# Patient Record
Sex: Male | Born: 1960 | Race: White | Hispanic: No | Marital: Married | Smoking: Never smoker
Health system: Southern US, Community
[De-identification: ages and names within clinical notes are randomized; demographics above are authoritative.]

## PROBLEM LIST (undated history)

## (undated) DIAGNOSIS — Z86010 Personal history of colonic polyps: Secondary | ICD-10-CM

## (undated) DIAGNOSIS — Z87898 Personal history of other specified conditions: Secondary | ICD-10-CM

## (undated) DIAGNOSIS — I10 Essential (primary) hypertension: Secondary | ICD-10-CM

## (undated) DIAGNOSIS — M1812 Unilateral primary osteoarthritis of first carpometacarpal joint, left hand: Secondary | ICD-10-CM

## (undated) DIAGNOSIS — C801 Malignant (primary) neoplasm, unspecified: Secondary | ICD-10-CM

## (undated) DIAGNOSIS — M1990 Unspecified osteoarthritis, unspecified site: Secondary | ICD-10-CM

## (undated) DIAGNOSIS — F419 Anxiety disorder, unspecified: Secondary | ICD-10-CM

## (undated) DIAGNOSIS — F411 Generalized anxiety disorder: Secondary | ICD-10-CM

## (undated) DIAGNOSIS — Z973 Presence of spectacles and contact lenses: Secondary | ICD-10-CM

## (undated) DIAGNOSIS — L729 Follicular cyst of the skin and subcutaneous tissue, unspecified: Secondary | ICD-10-CM

## (undated) DIAGNOSIS — T7840XA Allergy, unspecified, initial encounter: Secondary | ICD-10-CM

## (undated) DIAGNOSIS — Z9889 Other specified postprocedural states: Secondary | ICD-10-CM

## (undated) DIAGNOSIS — D72829 Elevated white blood cell count, unspecified: Secondary | ICD-10-CM

## (undated) DIAGNOSIS — E785 Hyperlipidemia, unspecified: Secondary | ICD-10-CM

## (undated) DIAGNOSIS — Q280 Arteriovenous malformation of precerebral vessels: Secondary | ICD-10-CM

## (undated) DIAGNOSIS — K5730 Diverticulosis of large intestine without perforation or abscess without bleeding: Secondary | ICD-10-CM

## (undated) DIAGNOSIS — D1802 Hemangioma of intracranial structures: Secondary | ICD-10-CM

## (undated) DIAGNOSIS — Z85828 Personal history of other malignant neoplasm of skin: Secondary | ICD-10-CM

## (undated) DIAGNOSIS — F329 Major depressive disorder, single episode, unspecified: Secondary | ICD-10-CM

## (undated) HISTORY — DX: Generalized anxiety disorder: F41.1

## (undated) HISTORY — PX: COLONOSCOPY: SHX174

## (undated) HISTORY — DX: Malignant (primary) neoplasm, unspecified: C80.1

## (undated) HISTORY — DX: Unilateral primary osteoarthritis of first carpometacarpal joint, left hand: M18.12

## (undated) HISTORY — PX: ANTERIOR CRUCIATE LIGAMENT REPAIR: SHX115

## (undated) HISTORY — DX: Elevated white blood cell count, unspecified: D72.829

## (undated) HISTORY — DX: Essential (primary) hypertension: I10

## (undated) HISTORY — DX: Allergy, unspecified, initial encounter: T78.40XA

## (undated) HISTORY — DX: Arteriovenous malformation of precerebral vessels: Q28.0

## (undated) HISTORY — DX: Anxiety disorder, unspecified: F41.9

## (undated) HISTORY — PX: POLYPECTOMY: SHX149

## (undated) HISTORY — DX: Hyperlipidemia, unspecified: E78.5

---

## 1991-11-11 HISTORY — PX: OTHER SURGICAL HISTORY: SHX169

## 1996-11-10 HISTORY — PX: WRIST GANGLION EXCISION: SUR520

## 1997-11-10 HISTORY — PX: SHOULDER SURGERY: SHX246

## 2002-07-13 ENCOUNTER — Inpatient Hospital Stay (HOSPITAL_COMMUNITY): Admission: EM | Admit: 2002-07-13 | Discharge: 2002-07-17 | Payer: Self-pay | Admitting: Psychiatry

## 2002-07-18 ENCOUNTER — Other Ambulatory Visit (HOSPITAL_COMMUNITY): Admission: RE | Admit: 2002-07-18 | Discharge: 2002-08-12 | Payer: Self-pay | Admitting: *Deleted

## 2002-08-24 ENCOUNTER — Encounter: Payer: Self-pay | Admitting: Family Medicine

## 2002-08-24 ENCOUNTER — Encounter: Admission: RE | Admit: 2002-08-24 | Discharge: 2002-08-24 | Payer: Self-pay | Admitting: Family Medicine

## 2007-11-06 ENCOUNTER — Inpatient Hospital Stay (HOSPITAL_COMMUNITY): Admission: EM | Admit: 2007-11-06 | Discharge: 2007-11-08 | Payer: Self-pay | Admitting: Family Medicine

## 2008-11-09 ENCOUNTER — Ambulatory Visit (HOSPITAL_COMMUNITY): Admission: RE | Admit: 2008-11-09 | Discharge: 2008-11-09 | Payer: Self-pay | Admitting: Internal Medicine

## 2011-03-25 NOTE — Discharge Summary (Signed)
NAME:  Nathan Ballard, Nathan Ballard NO.:  [id number]   MEDICAL RECORD NO.:  [id number]          PATIENT TYPE:  INP   LOCATION:  4735                         FACILITY:  [facility]   PHYSICIAN:  Herbie Saxon, MDDATE OF BIRTH:  [birth year]   DATE OF ADMISSION:  11/06/2007  DATE OF DISCHARGE:  11/08/2007                               DISCHARGE SUMMARY   DISCHARGE DIAGNOSES:  1. Altered mental status, resolved.  Possibly related to medication      interaction, question alcohol intoxication.  2. Hypertension.  3. Hyperlipidemia.  4. Anxiety.  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  He had been complaining of  headache for the preceding 3 days and he took a mixture of medications,  but he does not know the exact dose; just tried to alleviate the  headache.  The patient's confusion has been improved since admission.  MRI brain was performed and the report is pending.  Also interestingly,  his alcohol level was noted to be elevated at 105 at presentation.  However, to the patient vehemently denies any history of alcohol abuse  and his GGCP checked was also 96.  There is no seizure activity, no limb  weakness, no slurring of speech.  Memory is intact.  Lab work has mostly  been negative.   DISCHARGE CONDITION:  Stable.   DIET:  Heart-healthy, low-cholesterol.   ACTIVITY:  No restrictions.   FOLLOWUP:  Follow up with his primary care physician, Dr. Ricki Miller, in 3  days.  __________  is to consider possible neurology evaluation and  possible EEG to rule out transient seizure activity.   PHYSICAL EXAMINATION:  GENERAL:  Today, he is a middle-aged man, alert,  in no acute distress.  VITAL SIGNS:  Temperature 98, pulse is 80, respiratory rate is 20, blood  pressure 112/76.  HEENT:  Pupils  are equal and reactive to light and accommodation.  Head  is atraumatic, normocephalic.  Mucus membranes are moist.  NECK:  Supple.  Oropharynx and nasopharynx are clear.  No elevated JVD  or carotid bruits, no thyromegaly.  CHEST:  Clinically clear.  CARDIAC:  Heart sounds 1 and 2.  Regular rate and rhythm.  ABDOMEN:  Soft, nontender, no organomegaly.  Bowel sounds are  normoactive.  NEUROLOGIC:  He is alert, oriented x3.  Cranial nerves II-XII are  intact.  EXTREMITIES:  Peripheral pulses present.  No pedal edema.  Power is 5 in  all limbs and deep tendon reflexes are 2+ in all limbs.   Available lab tests show the lipid profile, HDL level of 33, troponin is  0.01, CK-MB 1.1.  Urine drug screen negative.  Urinalysis also negative.  The patient was sent for an MRI, which is pending.   PLAN:  The patient is to be discharged home if MRI does not show any  acute abnormality.  This has been explained to him.  His Relafen has  been discontinued.  The patient has been advised not to take to any  concoction of medications without conferring with his primary care  physician.   MEDICATIONS ON DISCHARGE:  Caduet 5/10 mg 1 daily.      Herbie Saxon, MD  Electronically Signed     MIO/MEDQ  D:  11/08/2007  T:  11/08/2007  Job:  [id number]

## 2011-03-25 NOTE — Procedures (Signed)
EEG NUMBER:  [id number].   CLINICAL HISTORY:  The patient is a 50 year old with an episode of  syncope on Christmas Eve.  The patient was slow to answer questions and  staggering.  The study is being done to look for the presence of  seizures (780.2).   PROCEDURE:  The tracing is carried out on a 32-channel, digital Cadwell  recorder reformatted into 16-channel montages with one devoted to EKG.  The patient was awake and drowsy during the recording.  The  international 10/20 system of lead placement was used.   MEDICATIONS:  Caduet.   DESCRIPTION OF FINDINGS:  Dominant frequency is a 10 Hz alpha range  activity admixed with beta range components throughout the entire  record.  The patient becomes drowsy at the end with mixed frequency, 10-  20 microvolt theta range components superimposed upon the beta range  background.  There was no focal slowing.  There was no interictal or  epileptiform activity in form of spikes or sharp waves.  Photic  stimulation failed to induce a driving response.  Hyperventilation  caused arousal, but no other change in the background.   EKG showed regular sinus rhythm with ventricular response of 72 beats  per minute.   IMPRESSION:  Normal record with the patient awake and drowsy.      Nathan Ballard. Sharene Skeans, M.D.  Electronically Signed     UEA:VWUJ  D:  11/09/2008 11:57:01  T:  11/10/2008 01:25:48  Job #:  [id number]   cc:   Juline Patch, M.D.  Fax: [phone]

## 2011-03-25 NOTE — Discharge Summary (Signed)
NAME:  Nathan Ballard, Nathan Ballard NO.:  [id number]   MEDICAL RECORD NO.:  [id number]          PATIENT TYPE:  INP   LOCATION:  4735                         FACILITY:  [facility]   PHYSICIAN:  Herbie Saxon, MDDATE OF BIRTH:  May 26, 1961   DATE OF ADMISSION:  11/06/2007  DATE OF DISCHARGE:                               DISCHARGE SUMMARY   ADDENDUM:  The MRI report shows no acute process, evidence of left  frontal lobe angioma seen.  This finding has been discussed with the  patient and he will need to have neurosurgical evaluation as outpatient.  This is to be arranged by Dr. Ricki Miller, his primary care physician.  The  patient has also been educated on the need to report to the emergency  room in case there is a recurrence of headache, nausea, vomiting,  blurring of vision, any other symptoms suggestive of intracranial  pressure.  He is also to inform his family of the finding in case he has  any confusion.  The patient has also been instructed on the need to have  an EEG possibly outpatient, and to have fall and seizure precautions as  well.  This patient is still going to be discharged today, and as  earlier planned neurology and neurosurgery evaluation in the next 1-2  weeks as per primary care physician.      Herbie Saxon, MD  Electronically Signed     MIO/MEDQ  D:  11/08/2007  T:  11/08/2007  Job:  [id number]

## 2011-03-25 NOTE — H&P (Signed)
NAME:  Nathan Ballard, Nathan Ballard NO.:  [id number]   MEDICAL RECORD NO.:  [id number]          PATIENT TYPE:  INP   LOCATION:  4735                         FACILITY:  [facility]   PHYSICIAN:  Della Goo, M.D. DATE OF BIRTH:  May 23, 1961   DATE OF ADMISSION:  11/06/2007  DATE OF DISCHARGE:                              HISTORY & PHYSICAL   PRIMARY CARE PHYSICIAN:  Unassigned.   CHIEF COMPLAINT:  Confusion.   HISTORY OF PRESENT ILLNESS:  This is a 50 year old male presenting to  the Emergency Department secondary to complaints of confusion that  lasted approximately three hours.  The patient's wife gives the report  and the patient is at bedside.  She reports that he was visiting his  father and she was called from the father's house secondary to Nathan Ballard acting out of character.  She states that she asked him over the  phone what he was doing, and he stated at that time that he was playing  video games and basically there are no video games at his father's  house.  When asked what else he had been doing, he further reported that  he was celebrating his children's birthday party and his wife reports  that that also was not true.  The patient in the Emergency Department  was beginning to be more lucid.  He reports having a constant headache  for the past three days, and he does remember the afternoon and the  events that his wife describes.  He denies having any fevers, chills,  nausea, vomiting.  The patient also denies any alcohol history.  The  patient was also found to have two unidentified pills in his pocket  which were reported as being from a [place] company.  These pills were  attempted to be identified, but Pharmacy was unsuccessful.   PAST MEDICAL HISTORY:  Significant for hypertension and hyperlipidemia.   PAST SURGICAL HISTORY:  History of three left knee surgeries, a right  shoulder surgery, and two right wrist surgeries.  The patient also has  had two  right great toe surgeries.   MEDICATIONS:  Medications at this time include Caduet, dosage unknown at  this time.  The patient also takes Relafen twice a day and ibuprofen as  needed.   ALLERGIES:  NO KNOWN DRUG ALLERGIES.   SOCIAL HISTORY:  The patient is a nonsmoker and he denies any alcohol  usage.   FAMILY HISTORY:  Positive for coronary artery disease in his father and  his paternal grandmother.  Also positive for diabetes mellitus in his  father and paternal grandmother, and hypertension as well in both of  them.  The patient also has a history of cancer in his mother, who had a  brain tumor.   REVIEW OF SYSTEMS:  Pertinent for mentioned above.   PHYSICAL EXAMINATION:  This is a 50 year old well-nourished, well-  developed male in no acute distress or discomfort at this time.  Vital  Signs: Temperature 98.1, blood pressure 127/89, heart rate 134,  respirations 18, O2 saturations 96-97%.  HEENT examination :  Normocephalic, atraumatic.  Pupils equally round  and reactive to light.  Extraocular muscles are intact funduscopic benign.  Oropharynx clear.  Neck is supple.  Full range of motion.  No thyromegaly, adenopathy,  jugular venous distention.  CARDIOVASCULAR:  Regular rate and rhythm.  No murmurs, gallops or rubs.  LUNGS: Clear to auscultation bilaterally.  ABDOMEN: Positive bowel sounds.  Soft, nontender, nondistended.  EXTREMITIES:  Without cyanosis, clubbing, or edema.  NEUROLOGIC  examination:  The patient is alert and oriented times three.  His speech  is clear.  There are no focal deficits.   LABORATORY STUDIES:  White blood cell count 6.3, hemoglobin 16.2,  hematocrit 46.9, platelets 291, neutrophils 61%, lymphocytes 30%.  Urinalysis negative.  Sodium 139, potassium 3.6, chloride 108, carbon  dioxide 23, BUN 7, creatinine 0.8, glucose 91.  Urine drug screen  negative.  Pro time 13.1, INR 1.0, PTT 25.  Alcohol level 105.   CT scan of the head actually negative for  any acute findings.   ASSESSMENT:  A 50 year old male being admitted with:  1. Altered mental status.  2. Cephalgia.  3. Hypertension.  4. Hyperlipidemia.   PLAN:  The patient will be admitted to a telemetry area for cardiac  monitoring.  Neurologic checks have been ordered.  An MRI study will be  performed of the brain in the a.m.  DVT and GI prophylaxes have been  ordered.  The patient's medications will be further verified.  The  patient will also be monitored for further changes in his condition.  His symptoms may have been secondary to alcohol and the ingestion of two  unknown medications.  This effect may have been transient.      Della Goo, M.D.  Electronically Signed     HJ/MEDQ  D:  11/07/2007  T:  11/08/2007  Job:  [id number]

## 2011-03-28 NOTE — H&P (Signed)
NAME:  Nathan Ballard, Nathan Ballard NO.:  [id number]   MEDICAL RECORD NO.:  [id number]                   PATIENT TYPE:  IPS   LOCATION:  0507                                 [facility]   PHYSICIAN:  Jeanice Lim, M.D.              DATE OF BIRTH:  13-Dec-1960   DATE OF ADMISSION:  07/13/2002  DATE OF DISCHARGE:  07/17/2002                         PSYCHIATRIC ADMISSION ASSESSMENT   IDENTIFYING INFORMATION:  A 50 year old married white male who was  voluntarily admitted on July 14, 2002.   HISTORY OF PRESENT ILLNESS:  The patient presents with a history of  polysubstance abuse, increasing use of his own medications, over-medicating,  taking up to 7 Valium per day, 4 Flexeril tablets, 3 Fioricets, using a  month's supply over a 3 day period.  The patient told his primary care  Nathan Ballard he needed to get a refill while he was on vacation and was  confronted by his wife of his excess use of his medications.  He states he  takes his medications to numb himself from responsibility.  He feels very  inadequate, having financial difficulties, feels depressed although he  states he has nothing to be depressed about.  His sleep and appetite has  been satisfactory.  He denies any psychotic symptoms and promises safety on  the unit.   PAST PSYCHIATRIC HISTORY:  First admission to [facility], no  other inpatient admissions, no outpatient treatment, no prior suicide  attempt, no history of detox.   SOCIAL HISTORY:  He is a 50 year old married white male, married for 13  years, has twins age 56, lives with his wife and children.  He works at  [employer], no legal problems, completed 2 years of college.   FAMILY HISTORY:  None.   ALCOHOL DRUG HISTORY:  The patient is a nonsmoker, nondrinker, denies any  substance abuse.   PAST MEDICAL HISTORY:  Primary care Nathan Ballard is Dr. Lupe Ballard at [facility].  Medical problems are  migraines.   MEDICATIONS:  Has been taking Valium 5 mg t.i.d. for anxiety, Fioricet for  headaches, Flexeril 1-2 q.4 hours, Effexor XR 150 mg every day, has been on  that for the past 10 months.  The patient has been on Wellbutrin in the  past.   DRUG ALLERGIES:  No known allergies.   PHYSICAL EXAMINATION:   REVIEW OF SYSTEMS:  The patient has borderline hypertension, history of  migraines for the past 2-3 months, has had a vasectomy, reports recent eye  infection of conjunctivitis and has occasional musculoskeletal problems.  Remainder of review of systems is noncontributory.  VITAL SIGNS:  the patient is 6 feet 1 inch tall.  Temperature is 97.7, heart  rate 95, 24 respirations, blood pressure is 158/96.  GENERAL:  The patient appears as a well-nourished, well-developed middle-age  male.  Does not appear in any acute distress, although he does complain of a  headache at this time.  HEAD:  Normocephalic, atraumatic.  SKIN:  Color is good.  CHEST:  His respirations are easy and regular.  NEURO:  Nonfocal exam, no tics or tremors.   LABORATORY DATA:  His CBC is within normal limits.  His CMET is within  normal limits.   MENTAL STATUS EXAM:  He is an alert, middle-aged Caucasian male, he is well-  developed, cooperative, with fair eye contact.  Speech is clear, mood is  depressed, affect is flat.  Thought processes are coherent, no psychosis, no  auditory or visual hallucinations, no suicidal or homicidal ideation.  Cognitive function intact.  Memory is fair, judgment is poor, insight is  poor.   ADMISSION DIAGNOSES:   AXIS I:  1. Major depression, severe.  2. Polysubstance abuse.   AXIS II:  Deferred.   AXIS III:  Conjunctivitis and migraines.   AXIS IV:  Problems with occupation and other psychosocial problems.   AXIS V:  Current is 45, this past year 23.   PLAN:  Voluntary admission for depression and polysubstance abuse.  Contract  for safety, check every 15  minutes.  Will put the patient on low-dose  Librium to detox safely.  Will encourage fluids.  Will increase his  antidepressant to decrease depressive symptoms, add trazodone for sleep,  Vistaril for anxiety.  Our plan is to stabilize his mood and thinking so the  patient can be safe, to increase his coping skills by attending groups.  Have a family session prior to discharge.  To follow up with private  psychiatrist, remain medication compliant, to attend IOP after discharge.   TENTATIVE LENGTH OF CARE:  3-5 days.       Landry Corporal, N.P.                       Jeanice Lim, M.D.    JO/MEDQ  D:  09/30/2002  T:  09/30/2002  Job:  [id number]

## 2011-03-28 NOTE — Discharge Summary (Signed)
NAME:  Nathan Ballard, Nathan Ballard NO.:  [id number]   MEDICAL RECORD NO.:  [id number]                   PATIENT TYPE:  IPS   LOCATION:  0507                                 [facility]   PHYSICIAN:  Jeanice Lim, M.D.              DATE OF BIRTH:  04-Oct-1961   DATE OF ADMISSION:  07/13/2002  DATE OF DISCHARGE:  07/17/2002                                 DISCHARGE SUMMARY   IDENTIFYING DATA:  This is a 50 year old Caucasian male voluntarily  admitted, presenting with a history of polysubstance abuse, escalating  Valium use, feeling inadequate, overwhelmed, depressed, with fleeting  suicidal ideation.   MEDICATIONS:  1. Valium.  2. Fioricet.  3. Flexeril.  4. Effexor.   DRUG ALLERGIES:  No known drug allergies.   PHYSICAL EXAMINATION:  GENERAL: Essentially within normal limits.  NEUROLOGIC: Nonfocal.   LABORATORY DATA:  Routine admission labs were essentially within normal  limits.   MENTAL STATUS EXAM:  Alert, middle-aged Caucasian male, cooperative with  fair eye contact.  Speech was clear.  Mood: Depressed.  Affect: Flat.  Thought process: Goal directed.  Thought content: Negative for dangerous  ideation or psychotic symptoms.  Cognitive: Intact.  Judgment and insight:  Poor.   ADMISSION DIAGNOSES:   AXIS I:  1. Major depression, severe.  2. Polysubstance abuse.   AXIS II:  None.   AXIS III:  1. Conjunctivitis.  2. History of migraines.   AXIS IV:  Moderate problems with primary support group and occupational  problems.   AXIS V:  [id number]   HOSPITAL COURSE:  The patient was admitted, ordered routine p.r.n.  medications, underwent further monitoring, and was encouraged to participate  in individual, group, and milieu therapy including substance abuse  treatment.  The patient was detoxified with Fioricet, Valium, and Flexeril,  with which he had been medicating himself.  He was upset about financial  situations.  He reported  motivation to see a psychologist in [place]  and to follow up with the [facility].  The patient was appropriate on the unit,  reporting no dangerous ideation, tolerated detoxification with no  significant withdrawal symptoms, and reported motivation to remain  abstinent.   CONDITION ON DISCHARGE:  Condition on discharge was improved.  Mood was more  euthymic.  Affect: Brighter.  Thought processes: Goal directed.  Thought  content: Negative for dangerous ideation or psychotic symptoms.  The patient  reported high motivation to remain sober and follow up with [facility].   DISCHARGE MEDICATIONS:  1. Effexor XR 75 mg two q.a.m. and one at 1 p.m.  2. Trazodone 100 mg one q.h.s.  3. Motrin 600 mg q.6.h. p.r.n. pain.   FOLLOW UP:  The patient was to follow up with Consuella Lose C. Valentina Lucks, M.D. at  [facility] and [facility] on Monday, September 8, at 4:30 p.m.   DISCHARGE DIAGNOSES:   AXIS I:  1. Major  depression, severe.  2. Polysubstance abuse.   AXIS II:  None.   AXIS III:  1. Conjunctivitis.  2. History of migraines.   AXIS IV:  Moderate problems with primary support group and occupational  problems.   AXIS V:  Global assessment of functioning on discharge was 55.                                                Jeanice Lim, M.D.    JEM/MEDQ  D:  09/01/2002  T:  09/01/2002  Job:  [id number]

## 2011-08-15 LAB — RAPID URINE DRUG SCREEN, HOSP PERFORMED
Amphetamines: NOT DETECTED
Barbiturates: NOT DETECTED
Benzodiazepines: NOT DETECTED
Cocaine: NOT DETECTED
Opiates: NOT DETECTED
Tetrahydrocannabinol: NOT DETECTED

## 2011-08-15 LAB — CBC
HCT: 46.9
Hemoglobin: 16.2
MCHC: 34.5
MCV: 93.6
Platelets: 291
RBC: 5.01
RDW: 12.6
WBC: 6.3

## 2011-08-15 LAB — URINALYSIS, ROUTINE W REFLEX MICROSCOPIC
Glucose, UA: NEGATIVE
Hgb urine dipstick: NEGATIVE
Ketones, ur: NEGATIVE
pH: 6.5

## 2011-08-15 LAB — COMPREHENSIVE METABOLIC PANEL
ALT: 27
AST: 24
Albumin: 4.5
Alkaline Phosphatase: 57
BUN: 7
CO2: 23
Calcium: 9
Chloride: 108
Creatinine, Ser: 0.8
GFR calc Af Amer: >60
GFR calc non Af Amer: >60
Glucose, Bld: 91
Potassium: 3.6
Sodium: 139
Total Bilirubin: 0.5
Total Protein: 6.9

## 2011-08-15 LAB — DIFFERENTIAL
Basophils Absolute: 0
Basophils Relative: 0
Eosinophils Absolute: 0.1
Eosinophils Relative: 1
Lymphocytes Relative: 30
Lymphs Abs: 1.9
Monocytes Absolute: 0.5
Monocytes Relative: 8
Neutro Abs: 3.8
Neutrophils Relative %: 61

## 2011-08-15 LAB — TROPONIN I
Troponin I: 0.01
Troponin I: 0.02
Troponin I: 0.03

## 2011-08-15 LAB — CK TOTAL AND CKMB (NOT AT ARMC)
CK, MB: 1.2
CK, MB: 1.5
Relative Index: 0.8
Relative Index: 0.8
Relative Index: 0.9
Total CK: 143
Total CK: 167

## 2011-08-15 LAB — LIPID PANEL
Cholesterol: 140
HDL: 33 — ABNORMAL LOW
LDL Cholesterol: 93
Total CHOL/HDL Ratio: 4.2
Triglycerides: 69
VLDL: 14

## 2011-08-15 LAB — ETHANOL: Alcohol, Ethyl (B): 105 — ABNORMAL HIGH

## 2011-08-15 LAB — PROTIME-INR
INR: 1
Prothrombin Time: 13.1

## 2011-08-15 LAB — GAMMA GT: GGT: 18

## 2012-07-28 ENCOUNTER — Encounter: Payer: Self-pay | Admitting: Internal Medicine

## 2012-08-20 ENCOUNTER — Encounter: Payer: Self-pay | Admitting: Gastroenterology

## 2012-08-23 ENCOUNTER — Ambulatory Visit (AMBULATORY_SURGERY_CENTER): Payer: Managed Care, Other (non HMO) | Admitting: *Deleted

## 2012-08-23 ENCOUNTER — Encounter: Payer: Self-pay | Admitting: Internal Medicine

## 2012-08-23 VITALS — Ht 72.0 in | Wt 233.0 lb

## 2012-08-23 DIAGNOSIS — Z1211 Encounter for screening for malignant neoplasm of colon: Secondary | ICD-10-CM

## 2012-08-23 MED ORDER — NA SULFATE-K SULFATE-MG SULF 17.5-3.13-1.6 GM/177ML PO SOLN: ORAL | Status: DC

## 2012-09-03 ENCOUNTER — Encounter: Payer: Self-pay | Admitting: Internal Medicine

## 2012-09-03 ENCOUNTER — Ambulatory Visit (AMBULATORY_SURGERY_CENTER): Payer: Managed Care, Other (non HMO) | Admitting: Internal Medicine

## 2012-09-03 VITALS — BP 118/71 | HR 69 | Temp 97.8°F | Resp 37 | Ht 72.0 in | Wt 233.0 lb

## 2012-09-03 DIAGNOSIS — Z86010 Personal history of colonic polyps: Secondary | ICD-10-CM

## 2012-09-03 DIAGNOSIS — D126 Benign neoplasm of colon, unspecified: Secondary | ICD-10-CM

## 2012-09-03 DIAGNOSIS — Z1211 Encounter for screening for malignant neoplasm of colon: Secondary | ICD-10-CM

## 2012-09-03 DIAGNOSIS — Z860101 Personal history of adenomatous and serrated colon polyps: Secondary | ICD-10-CM

## 2012-09-03 DIAGNOSIS — K635 Polyp of colon: Secondary | ICD-10-CM

## 2012-09-03 HISTORY — DX: Personal history of adenomatous and serrated colon polyps: Z86.0101

## 2012-09-03 HISTORY — PX: COLONOSCOPY WITH PROPOFOL: SHX5780

## 2012-09-03 HISTORY — DX: Personal history of colonic polyps: Z86.010

## 2012-09-03 MED ORDER — SODIUM CHLORIDE 0.9 % IV SOLN: 500.0000 mL | INTRAVENOUS | Status: DC

## 2012-09-03 NOTE — Op Note (Signed)
[facility] [address] [place], [address]   COLONOSCOPY PROCEDURE REPORT  PATIENT: Nathan Ballard, Nathan Ballard  MR#: [id number] BIRTHDATE: 1961/04/28 , 50  yrs. old GENDER: Male ENDOSCOPIST: Beverley Fiedler, MD REFERRED XB:JYNW, Richard PROCEDURE DATE:  09/03/2012 PROCEDURE:   Colonoscopy with snare polypectomy and Colonoscopy with cold biopsy polypectomy ASA CLASS:   Class II INDICATIONS:average risk screening and first colonoscopy. MEDICATIONS: MAC sedation, administered by CRNA and propofol (Diprivan) 400mg  IV  DESCRIPTION OF PROCEDURE:   After the risks benefits and alternatives of the procedure were thoroughly explained, informed consent was obtained.  A digital rectal exam revealed no rectal mass.   The LB CF-H180AL [id number]  endoscope was introduced through the anus and advanced to the cecum, which was identified by both the appendix and ileocecal valve. No adverse events experienced. The quality of the prep was Suprep good  The instrument was then slowly withdrawn as the colon was fully examined.   COLON FINDINGS: Two sessile polyps ranging between 3-35mm in size were found in the ascending colon.  A polypectomy was performed with a cold snare.  The resection was complete and one polyp was retrieved.   A sessile polyp measuring 3 mm in size was found in the descending colon.  A polypectomy was performed with cold forceps.  The resection was complete and the polyp tissue was completely retrieved.   There was moderate diverticulosis noted in the ascending colon, descending colon, and sigmoid colon with associated muscular hypertrophy.  Retroflexed views revealed no abnormalities. The time to cecum=2 minutes 56 seconds.  Withdrawal time=14 minutes 44 seconds.  The scope was withdrawn and the procedure completed.  COMPLICATIONS: There were no complications.  ENDOSCOPIC IMPRESSION: 1.   Two sessile polyps ranging between 3-78mm in size were found in the  ascending colon; polypectomy was performed with a cold snare 2.   Sessile polyp measuring 3 mm in size was found in the descending colon; polypectomy was performed with cold forceps 3.   There was moderate diverticulosis noted in the ascending colon, descending colon, and sigmoid colon  RECOMMENDATIONS: 1.  Await pathology results 2.  High fiber diet 3.  Timing of repeat colonoscopy will be determined by pathology findings. 4.  You will receive a letter within 1-2 weeks with the results of your biopsy as well as final recommendations.  Please call my office if you have not received a letter after 3 weeks.  eSigned:  Beverley Fiedler, MD 09/03/2012 2:06 PM cc: Juline Patch, MD and The Patient

## 2012-09-03 NOTE — Patient Instructions (Addendum)
YOU HAD AN ENDOSCOPIC PROCEDURE TODAY AT THE [facility]: Refer to the procedure report that was given to you for any specific questions about what was found during the examination.  If the procedure report does not answer your questions, please call your gastroenterologist to clarify.  If you requested that your care partner not be given the details of your procedure findings, then the procedure report has been included in a sealed envelope for you to review at your convenience later.  YOU SHOULD EXPECT: Some feelings of bloating in the abdomen. Passage of more gas than usual.  Walking can help get rid of the air that was put into your GI tract during the procedure and reduce the bloating. If you had a lower endoscopy (such as a colonoscopy or flexible sigmoidoscopy) you may notice spotting of blood in your stool or on the toilet paper. If you underwent a bowel prep for your procedure, then you may not have a normal bowel movement for a few days.  DIET: Your first meal following the procedure should be a light meal and then it is ok to progress to your normal diet.  A half-sandwich or bowl of soup is an example of a good first meal.  Heavy or fried foods are harder to digest and may make you feel nauseous or bloated.  Likewise meals heavy in dairy and vegetables can cause extra gas to form and this can also increase the bloating.  Drink plenty of fluids but you should avoid alcoholic beverages for 24 hours.  ACTIVITY: Your care partner should take you home directly after the procedure.  You should plan to take it easy, moving slowly for the rest of the day.  You can resume normal activity the day after the procedure however you should NOT DRIVE or use heavy machinery for 24 hours (because of the sedation medicines used during the test).    SYMPTOMS TO REPORT IMMEDIATELY: A gastroenterologist can be reached at any hour.  During normal business hours, 8:30 AM to 5:00 PM Monday through Friday,  call [phone].  After hours and on weekends, please call the GI answering service at [phone] who will take a message and have the physician on call contact you.   Following lower endoscopy (colonoscopy or flexible sigmoidoscopy):  Excessive amounts of blood in the stool  Significant tenderness or worsening of abdominal pains  Swelling of the abdomen that is new, acute  Fever of 100F or higher    FOLLOW UP: If any biopsies were taken you will be contacted by phone or by letter within the next 1-3 weeks.  Call your gastroenterologist if you have not heard about the biopsies in 3 weeks.  Our staff will call the home number listed on your records the next business day following your procedure to check on you and address any questions or concerns that you may have at that time regarding the information given to you following your procedure. This is a courtesy call and so if there is no answer at the home number and we have not heard from you through the emergency physician on call, we will assume that you have returned to your regular daily activities without incident.  SIGNATURES/CONFIDENTIALITY: You and/or your care partner have signed paperwork which will be entered into your electronic medical record.  These signatures attest to the fact that that the information above on your After Visit Summary has been reviewed and is understood.  Full responsibility of the confidentiality   of this discharge information lies with you and/or your care-partner.   INFORMATION ON POLYPS GIVEN TO YOU TODAY  INFORMATION ON DIVERTICULOSIS & HIGH FIBER DIET GIVEN TO YOU TODAY

## 2012-09-03 NOTE — Progress Notes (Signed)
Patient did not experience any of the following events: a burn prior to discharge; a fall within the facility; wrong site/side/patient/procedure/implant event; or a hospital transfer or hospital admission upon discharge from the facility. (G8907) Patient did not have preoperative order for IV antibiotic SSI prophylaxis. (G8918)  

## 2012-09-06 ENCOUNTER — Telehealth: Payer: Self-pay

## 2012-09-06 NOTE — Telephone Encounter (Signed)
  Follow up Call-  Call back number 09/03/2012  Post procedure Call Back phone  # [phone]  Permission to leave phone message Yes     Patient questions:  Do you have a fever, pain , or abdominal swelling? no Pain Score  0 *  Have you tolerated food without any problems? yes  Have you been able to return to your normal activities? yes  Do you have any questions about your discharge instructions: Diet   no Medications  no Follow up visit  no  Do you have questions or concerns about your Care? no  Actions: * If pain score is 4 or above: No action needed, pain <4.

## 2012-09-09 ENCOUNTER — Encounter: Payer: Self-pay | Admitting: Internal Medicine

## 2014-08-21 ENCOUNTER — Other Ambulatory Visit: Payer: Self-pay | Admitting: Dermatology

## 2014-08-21 DIAGNOSIS — Z85828 Personal history of other malignant neoplasm of skin: Secondary | ICD-10-CM

## 2014-08-21 DIAGNOSIS — Z9889 Other specified postprocedural states: Secondary | ICD-10-CM

## 2014-08-21 HISTORY — DX: Personal history of other malignant neoplasm of skin: Z85.828

## 2014-08-21 HISTORY — DX: Other specified postprocedural states: Z98.890

## 2015-02-15 ENCOUNTER — Other Ambulatory Visit: Payer: Self-pay | Admitting: Dermatology

## 2015-08-13 ENCOUNTER — Other Ambulatory Visit: Payer: Self-pay | Admitting: Orthopaedic Surgery

## 2015-08-13 DIAGNOSIS — M25561 Pain in right knee: Secondary | ICD-10-CM

## 2015-08-22 ENCOUNTER — Ambulatory Visit
Admission: RE | Admit: 2015-08-22 | Discharge: 2015-08-22 | Disposition: A | Discharge status: Home / Self Care | Payer: Managed Care, Other (non HMO) | Source: Ambulatory Visit | Attending: Orthopaedic Surgery | Admitting: Orthopaedic Surgery

## 2015-08-22 DIAGNOSIS — M25561 Pain in right knee: Secondary | ICD-10-CM

## 2015-08-30 ENCOUNTER — Other Ambulatory Visit (HOSPITAL_BASED_OUTPATIENT_CLINIC_OR_DEPARTMENT_OTHER): Payer: Self-pay | Admitting: Orthopaedic Surgery

## 2015-09-12 ENCOUNTER — Other Ambulatory Visit: Payer: Self-pay | Admitting: Cardiology

## 2015-09-12 ENCOUNTER — Ambulatory Visit
Admission: RE | Admit: 2015-09-12 | Discharge: 2015-09-12 | Disposition: A | Discharge status: Home / Self Care | Payer: Managed Care, Other (non HMO) | Source: Ambulatory Visit | Attending: Cardiology | Admitting: Cardiology

## 2015-09-12 DIAGNOSIS — R0602 Shortness of breath: Secondary | ICD-10-CM

## 2015-09-13 ENCOUNTER — Encounter (HOSPITAL_BASED_OUTPATIENT_CLINIC_OR_DEPARTMENT_OTHER): Payer: Self-pay | Admitting: *Deleted

## 2015-09-19 ENCOUNTER — Ambulatory Visit (HOSPITAL_BASED_OUTPATIENT_CLINIC_OR_DEPARTMENT_OTHER)
Admission: RE | Admit: 2015-09-19 | Discharge: 2015-09-19 | Disposition: A | Discharge status: Home / Self Care | Payer: Managed Care, Other (non HMO) | Source: Ambulatory Visit | Attending: Orthopaedic Surgery | Admitting: Orthopaedic Surgery

## 2015-09-19 ENCOUNTER — Ambulatory Visit (HOSPITAL_BASED_OUTPATIENT_CLINIC_OR_DEPARTMENT_OTHER): Payer: Managed Care, Other (non HMO) | Admitting: Certified Registered"

## 2015-09-19 ENCOUNTER — Encounter (HOSPITAL_BASED_OUTPATIENT_CLINIC_OR_DEPARTMENT_OTHER)
Admission: RE | Disposition: A | Discharge status: Home / Self Care | Payer: Self-pay | Source: Ambulatory Visit | Attending: Orthopaedic Surgery

## 2015-09-19 ENCOUNTER — Encounter (HOSPITAL_BASED_OUTPATIENT_CLINIC_OR_DEPARTMENT_OTHER): Payer: Self-pay | Admitting: Anesthesiology

## 2015-09-19 DIAGNOSIS — Z79899 Other long term (current) drug therapy: Secondary | ICD-10-CM | POA: Diagnosis not present

## 2015-09-19 DIAGNOSIS — I10 Essential (primary) hypertension: Secondary | ICD-10-CM | POA: Diagnosis not present

## 2015-09-19 DIAGNOSIS — M1990 Unspecified osteoarthritis, unspecified site: Secondary | ICD-10-CM | POA: Insufficient documentation

## 2015-09-19 DIAGNOSIS — Z885 Allergy status to narcotic agent status: Secondary | ICD-10-CM | POA: Diagnosis not present

## 2015-09-19 DIAGNOSIS — M65861 Other synovitis and tenosynovitis, right lower leg: Secondary | ICD-10-CM | POA: Insufficient documentation

## 2015-09-19 DIAGNOSIS — M25861 Other specified joint disorders, right knee: Secondary | ICD-10-CM | POA: Insufficient documentation

## 2015-09-19 DIAGNOSIS — M23231 Derangement of other medial meniscus due to old tear or injury, right knee: Secondary | ICD-10-CM | POA: Diagnosis not present

## 2015-09-19 DIAGNOSIS — F419 Anxiety disorder, unspecified: Secondary | ICD-10-CM | POA: Insufficient documentation

## 2015-09-19 DIAGNOSIS — M94261 Chondromalacia, right knee: Secondary | ICD-10-CM | POA: Insufficient documentation

## 2015-09-19 DIAGNOSIS — E785 Hyperlipidemia, unspecified: Secondary | ICD-10-CM | POA: Insufficient documentation

## 2015-09-19 HISTORY — PX: KNEE ARTHROSCOPY WITH MEDIAL MENISECTOMY: SHX5651

## 2015-09-19 SURGERY — ARTHROSCOPY, KNEE, WITH MEDIAL MENISCECTOMY
Anesthesia: General | Site: Knee | Laterality: Right

## 2015-09-19 MED ORDER — OXYCODONE HCL 5 MG PO TABS
5.0000 mg | ORAL_TABLET | Freq: Once | ORAL | Status: AC | PRN
  Administered 2015-09-19: 5 mg via ORAL

## 2015-09-19 MED ORDER — PROPOFOL 10 MG/ML IV BOLUS
INTRAVENOUS | Status: AC
  Filled 2015-09-19: qty 20

## 2015-09-19 MED ORDER — GLYCOPYRROLATE 0.2 MG/ML IJ SOLN: 0.2000 mg | Freq: Once | INTRAMUSCULAR | Status: DC | PRN

## 2015-09-19 MED ORDER — PROPOFOL 10 MG/ML IV BOLUS
INTRAVENOUS | Status: DC | PRN
Start: 2015-09-19 — End: 2015-09-19
  Administered 2015-09-19: 200 mg via INTRAVENOUS

## 2015-09-19 MED ORDER — ONDANSETRON HCL 4 MG/2ML IJ SOLN
INTRAMUSCULAR | Status: AC
  Filled 2015-09-19: qty 2

## 2015-09-19 MED ORDER — KETOROLAC TROMETHAMINE 30 MG/ML IJ SOLN
INTRAMUSCULAR | Status: AC
  Filled 2015-09-19: qty 1

## 2015-09-19 MED ORDER — HYDROMORPHONE HCL 1 MG/ML IJ SOLN
INTRAMUSCULAR | Status: AC
  Filled 2015-09-19: qty 1

## 2015-09-19 MED ORDER — FENTANYL CITRATE (PF) 100 MCG/2ML IJ SOLN: 50.0000 ug | INTRAMUSCULAR | Status: DC | PRN

## 2015-09-19 MED ORDER — NALOXONE HCL 0.4 MG/ML IJ SOLN
INTRAMUSCULAR | Status: AC
  Filled 2015-09-19: qty 1

## 2015-09-19 MED ORDER — HYDROMORPHONE HCL 1 MG/ML IJ SOLN
0.2500 mg | INTRAMUSCULAR | Status: DC | PRN
  Administered 2015-09-19 (×4): 0.5 mg via INTRAVENOUS
  Administered 2015-09-19 (×2): 0.25 mg via INTRAVENOUS
  Administered 2015-09-19: 0.5 mg via INTRAVENOUS

## 2015-09-19 MED ORDER — MIDAZOLAM HCL 2 MG/2ML IJ SOLN
1.0000 mg | INTRAMUSCULAR | Status: DC | PRN
Start: 2015-09-19 — End: 2015-09-19

## 2015-09-19 MED ORDER — FENTANYL CITRATE (PF) 100 MCG/2ML IJ SOLN
INTRAMUSCULAR | Status: DC | PRN
  Administered 2015-09-19: 100 ug via INTRAVENOUS
  Administered 2015-09-19 (×2): 50 ug via INTRAVENOUS

## 2015-09-19 MED ORDER — BUPIVACAINE HCL (PF) 0.5 % IJ SOLN
INTRAMUSCULAR | Status: DC | PRN
  Administered 2015-09-19: 12 mL

## 2015-09-19 MED ORDER — EPHEDRINE SULFATE 50 MG/ML IJ SOLN
INTRAMUSCULAR | Status: DC | PRN
  Administered 2015-09-19: 10 mg via INTRAVENOUS

## 2015-09-19 MED ORDER — EPHEDRINE SULFATE 50 MG/ML IJ SOLN
INTRAMUSCULAR | Status: AC
  Filled 2015-09-19: qty 1

## 2015-09-19 MED ORDER — BUPIVACAINE HCL (PF) 0.5 % IJ SOLN
INTRAMUSCULAR | Status: AC
  Filled 2015-09-19: qty 30

## 2015-09-19 MED ORDER — ONDANSETRON HCL 4 MG/2ML IJ SOLN: 4.0000 mg | Freq: Four times a day (QID) | INTRAMUSCULAR | Status: DC | PRN

## 2015-09-19 MED ORDER — DEXAMETHASONE SODIUM PHOSPHATE 10 MG/ML IJ SOLN
INTRAMUSCULAR | Status: AC
  Filled 2015-09-19: qty 1

## 2015-09-19 MED ORDER — OXYCODONE HCL 5 MG PO TABS
ORAL_TABLET | ORAL | Status: AC
  Filled 2015-09-19: qty 1

## 2015-09-19 MED ORDER — LIDOCAINE HCL (CARDIAC) 20 MG/ML IV SOLN
INTRAVENOUS | Status: DC | PRN
  Administered 2015-09-19: 50 mg via INTRAVENOUS

## 2015-09-19 MED ORDER — FENTANYL CITRATE (PF) 100 MCG/2ML IJ SOLN
INTRAMUSCULAR | Status: AC
  Filled 2015-09-19: qty 4

## 2015-09-19 MED ORDER — DEXAMETHASONE SODIUM PHOSPHATE 4 MG/ML IJ SOLN
INTRAMUSCULAR | Status: DC | PRN
  Administered 2015-09-19: 10 mg via INTRAVENOUS

## 2015-09-19 MED ORDER — CEFAZOLIN SODIUM-DEXTROSE 2-3 GM-% IV SOLR
2.0000 g | INTRAVENOUS | Status: AC
  Administered 2015-09-19: 2 g via INTRAVENOUS

## 2015-09-19 MED ORDER — MIDAZOLAM HCL 2 MG/2ML IJ SOLN
INTRAMUSCULAR | Status: AC
  Filled 2015-09-19: qty 4

## 2015-09-19 MED ORDER — GLYCOPYRROLATE 0.2 MG/ML IJ SOLN
INTRAMUSCULAR | Status: DC | PRN
  Administered 2015-09-19: 0.2 mg via INTRAVENOUS

## 2015-09-19 MED ORDER — MIDAZOLAM HCL 5 MG/5ML IJ SOLN: INTRAMUSCULAR | Status: DC | PRN

## 2015-09-19 MED ORDER — SODIUM CHLORIDE 0.9 % IR SOLN
Status: DC | PRN
  Administered 2015-09-19: 4000 mL

## 2015-09-19 MED ORDER — HYDROCODONE-ACETAMINOPHEN 7.5-325 MG PO TABS: 1.0000 | ORAL_TABLET | Freq: Four times a day (QID) | ORAL | Status: DC | PRN

## 2015-09-19 MED ORDER — SCOPOLAMINE 1 MG/3DAYS TD PT72: 1.0000 | MEDICATED_PATCH | Freq: Once | TRANSDERMAL | Status: DC | PRN

## 2015-09-19 MED ORDER — KETOROLAC TROMETHAMINE 30 MG/ML IJ SOLN
INTRAMUSCULAR | Status: DC | PRN
  Administered 2015-09-19: 30 mg via INTRAVENOUS

## 2015-09-19 MED ORDER — LACTATED RINGERS IV SOLN
INTRAVENOUS | Status: DC
  Administered 2015-09-19 (×3): via INTRAVENOUS

## 2015-09-19 MED ORDER — CEFAZOLIN SODIUM-DEXTROSE 2-3 GM-% IV SOLR
INTRAVENOUS | Status: AC
  Filled 2015-09-19: qty 50

## 2015-09-19 MED ORDER — LIDOCAINE HCL (CARDIAC) 20 MG/ML IV SOLN
INTRAVENOUS | Status: AC
  Filled 2015-09-19: qty 5

## 2015-09-19 MED ORDER — OXYCODONE HCL 5 MG/5ML PO SOLN: 5.0000 mg | Freq: Once | ORAL | Status: AC | PRN

## 2015-09-19 MED ORDER — MIDAZOLAM HCL 5 MG/5ML IJ SOLN
INTRAMUSCULAR | Status: DC | PRN
  Administered 2015-09-19: 2 mg via INTRAVENOUS

## 2015-09-19 SURGICAL SUPPLY — 41 items
BANDAGE ELASTIC 6 VELCRO ST LF (GAUZE/BANDAGES/DRESSINGS) ×4 IMPLANT
BANDAGE ESMARK 6X9 LF (GAUZE/BANDAGES/DRESSINGS) IMPLANT
BLADE 4.2CUDA (BLADE) ×2 IMPLANT
BLADE CUDA GRT WHITE 3.5 (BLADE) IMPLANT
BLADE CUDA SHAVER 3.5 (BLADE) IMPLANT
BLADE CUTTER GATOR 3.5 (BLADE) IMPLANT
BNDG CMPR 9X6 STRL LF SNTH (GAUZE/BANDAGES/DRESSINGS)
BNDG ESMARK 6X9 LF (GAUZE/BANDAGES/DRESSINGS)
CUFF TOURNIQUET SINGLE 34IN LL (TOURNIQUET CUFF) ×1 IMPLANT
DRAPE ARTHROSCOPY W/POUCH 90 (DRAPES) ×2 IMPLANT
DRAPE SURG 17X23 STRL (DRAPES) ×4 IMPLANT
DURAPREP 26ML APPLICATOR (WOUND CARE) ×2 IMPLANT
ELECT MENISCUS 165MM 90D (ELECTRODE) IMPLANT
ELECT REM PT RETURN 9FT ADLT (ELECTROSURGICAL)
ELECTRODE REM PT RTRN 9FT ADLT (ELECTROSURGICAL) IMPLANT
GAUZE SPONGE 4X4 12PLY STRL (GAUZE/BANDAGES/DRESSINGS) ×2 IMPLANT
GAUZE XEROFORM 1X8 LF (GAUZE/BANDAGES/DRESSINGS) ×2 IMPLANT
GLOVE BIO SURGEON STRL SZ 6.5 (GLOVE) ×1 IMPLANT
GLOVE BIOGEL M STRL SZ7.5 (GLOVE) ×1 IMPLANT
GLOVE BIOGEL PI IND STRL 8 (GLOVE) IMPLANT
GLOVE BIOGEL PI INDICATOR 8 (GLOVE) ×1
GLOVE NEODERM STRL 7.5 LF PF (GLOVE) ×1 IMPLANT
GLOVE SURG NEODERM 7.5  LF PF (GLOVE) ×1
GLOVE SURG SYN 7.5  E (GLOVE) ×1
GLOVE SURG SYN 7.5 E (GLOVE) ×1 IMPLANT
GLOVE SURG SYN 7.5 PF PI (GLOVE) ×1 IMPLANT
GOWN STRL REIN XL XLG (GOWN DISPOSABLE) ×2 IMPLANT
GOWN STRL REUS W/ TWL LRG LVL3 (GOWN DISPOSABLE) ×1 IMPLANT
GOWN STRL REUS W/TWL LRG LVL3 (GOWN DISPOSABLE) ×2
KNEE WRAP E Z 3 GEL PACK (MISCELLANEOUS) ×2 IMPLANT
MANIFOLD NEPTUNE II (INSTRUMENTS) ×2 IMPLANT
PACK ARTHROSCOPY DSU (CUSTOM PROCEDURE TRAY) ×2 IMPLANT
PACK BASIN DAY SURGERY FS (CUSTOM PROCEDURE TRAY) ×2 IMPLANT
PENCIL BUTTON HOLSTER BLD 10FT (ELECTRODE) IMPLANT
RESECTOR FULL RADIUS 4.2MM (BLADE) IMPLANT
SLEEVE SCD COMPRESS KNEE MED (MISCELLANEOUS) ×2 IMPLANT
SUT ETHILON 3 0 PS 1 (SUTURE) ×2 IMPLANT
TOWEL OR 17X24 6PK STRL BLUE (TOWEL DISPOSABLE) ×2 IMPLANT
TOWEL OR NON WOVEN STRL DISP B (DISPOSABLE) IMPLANT
TUBING ARTHROSCOPY IRRIG 16FT (MISCELLANEOUS) ×2 IMPLANT
WATER STERILE IRR 1000ML POUR (IV SOLUTION) ×2 IMPLANT

## 2015-09-19 NOTE — Op Note (Signed)
   Surgery Date: 09/19/2015  Surgeon(s): Stevie Charter Ephriam Jenkins, MD  ANESTHESIA:  general  FLUIDS: Per anesthesia record.   ESTIMATED BLOOD LOSS: minimal  PREOPERATIVE DIAGNOSES:  1. Right knee medial meniscus tear 2. Right knee synovitis 3. 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 Right knee ACL cyst  POSTOPERATIVE DIAGNOSES:  same  PROCEDURES PERFORMED:  1. Right knee arthroscopy with limited synovectomy 2. knee arthroscopy with arthroscopic partial medial meniscectomy 3. knee arthroscopy with arthroscopic chondroplasty medial femoral condyle and trochlea 4. Knee arthroscopy with microfracture of femoral trochlea 5. Knee arthroscopy with decompression of ACL cyts  DESCRIPTION OF PROCEDURE: Nathan Ballard is a 54 y.o.-year-old male with right knee medial meniscus tear and chondromalacia. Plans are to proceed with partial medial meniscectomy and diagnostic arthroscopy with debridement as indicated. Full discussion held regarding risks benefits alternatives and complications related surgical intervention. Conservative care options reviewed. All questions answered.  The patient was identified in the preoperative holding area and the operative extremity was marked. The patient was brought to the operating room and transferred to operating table in a supine position. Satisfactory general anesthesia was induced by anesthesiology.    Standard anterolateral, anteromedial arthroscopy portals were obtained. The anteromedial portal was obtained with a spinal needle for localization under direct visualization with subsequent diagnostic findings.   Anteromedial and anterolateral chambers: mild synovitis. The synovitis was debrided with a 4.5 mm full radius shaver through both the anteromedial and lateral portals.   Suprapatellar pouch and gutters: mild synovitis or debris. Patella chondral surface: Grade 0 Trochlear chondral surface: Grade 4 Patellofemoral tracking:  normal Medial meniscus: horizontal cleavage tear of midbody and posterior horn.  Medial femoral condyle flexion bearing surface: Grade 3 Medial femoral condyle extension bearing surface: Grade 3 Medial tibial plateau: Grade 2 Anterior cruciate ligament:stable Posterior cruciate ligament:stable Lateral meniscus: normal.   Lateral femoral condyle flexion bearing surface: Grade 0 Lateral femoral condyle extension bearing surface: Grade 0 Lateral tibial plateau: Grade 0  After completion of synovectomy, diagnostic exam, and debridements as described, all compartments were checked and no residual debris remained. Hemostasis was achieved with the cautery wand. The portals were approximated with interrupted nylon suture. All excess fluid was expressed from the joint. The portals were approximated with interrupted nylon suture. Xeroform sterile gauze dressings were applied followed by Ace bandage and ice pack.   DISPOSITION: The patient was awakened from general anesthetic, extubated, taken to the recovery room in medically stable condition, no apparent complications. The patient may be weightbearing as tolerated to the operative lower extremity.  Range of motion of right knee as tolerated.  Nathan Cecil, MD [place] 9:14 AM

## 2015-09-19 NOTE — Anesthesia Postprocedure Evaluation (Signed)
Anesthesia Post Note  Patient: Nathan Ballard  Procedure(s) Performed: Procedure(s) (LRB): RIGHT KNEE ARTHROSCOPY WITH PARTIAL MEDIAL MENISCECTOMY (Right)  Anesthesia type: General  Patient location: PACU  Post pain: Pain level controlled and Adequate analgesia  Post assessment: Post-op Vital signs reviewed, Patient's Cardiovascular Status Stable, Respiratory Function Stable, Patent Airway and Pain level controlled  Last Vitals:  Filed Vitals:   09/19/15 1040  BP: 128/78  Pulse: 57  Temp: 36.7 C  Resp: 18    Post vital signs: Reviewed and stable  Level of consciousness: awake, alert  and oriented  Complications: No apparent anesthesia complications

## 2015-09-19 NOTE — Transfer of Care (Signed)
Immediate Anesthesia Transfer of Care Note  Patient: Nathan Ballard  Procedure(s) Performed: Procedure(s): RIGHT KNEE ARTHROSCOPY WITH PARTIAL MEDIAL MENISCECTOMY (Right)  Patient Location: PACU  Anesthesia Type:General  Level of Consciousness: awake and patient cooperative  Airway & Oxygen Therapy: Patient Spontanous Breathing and Patient connected to face mask oxygen  Post-op Assessment: Report given to RN and Post -op Vital signs reviewed and stable  Post vital signs: Reviewed and stable  Last Vitals:  Filed Vitals:   09/19/15 0730  BP: 135/80  Pulse: 61  Temp: 36.6 C  Resp: 18    Complications: No apparent anesthesia complications

## 2015-09-19 NOTE — Anesthesia Procedure Notes (Signed)
Procedure Name: LMA Insertion Date/Time: 09/19/2015 8:29 AM Performed by: Toula Moos L Pre-anesthesia Checklist: Patient identified, Emergency Drugs available, Suction available and Patient being monitored Patient Re-evaluated:Patient Re-evaluated prior to inductionOxygen Delivery Method: Circle System Utilized Preoxygenation: Pre-oxygenation with 100% oxygen Intubation Type: IV induction Ventilation: Mask ventilation without difficulty LMA: LMA inserted LMA Size: 5.0 Number of attempts: 1 Airway Equipment and Method: Bite block Placement Confirmation: positive ETCO2 Tube secured with: Tape Dental Injury: Teeth and Oropharynx as per pre-operative assessment

## 2015-09-19 NOTE — H&P (Signed)
    PREOPERATIVE H&P  Chief Complaint: Right knee medial meniscal tear  HPI: Nathan Ballard is a 54 y.o. male who presents for surgical treatment of Right knee medial meniscal tear.  He denies any changes in medical history.  Past Medical History  Diagnosis Date  . Hypertension   . Hyperlipidemia   . Anxiety   . Arthritis    Past Surgical History  Procedure Laterality Date  . Anterior cruciate ligament repair  1989, 2007    left  . Shoulder surgery  1999    right  . Ganglion cyst excision  1998    right wrist  . Removal toe nail  1993    right great toe   Social History   Social History  . Marital Status: Married    Spouse Name: N/A  . Number of Children: N/A  . Years of Education: N/A   Social History Main Topics  . Smoking status: Never Smoker   . Smokeless tobacco: Never Used  . Alcohol Use: No  . Drug Use: No  . Sexual Activity: Not Asked   Other Topics Concern  . None   Social History Narrative   Family History  Problem Relation Age of Onset  . Colon cancer Neg Hx   . Stomach cancer Neg Hx   . Diabetes Father   . Heart disease Father    No Known Allergies Prior to Admission medications   Medication Sig Start Date End Date Taking? Authorizing Provider  ALPRAZolam (XANAX) 0.25 MG tablet Take 0.25 mg by mouth as needed.  07/28/12  Yes Historical Provider, MD  atorvastatin (LIPITOR) 40 MG tablet Take 20 mg by mouth daily.  08/18/12  Yes Historical Provider, MD  Multiple Vitamin (MULTIVITAMIN) tablet Take 1 tablet by mouth daily.   Yes Historical Provider, MD  nebivolol (BYSTOLIC) 10 MG tablet Take 10 mg by mouth daily.   Yes Historical Provider, MD     Positive ROS: All other systems have been reviewed and were otherwise negative with the exception of those mentioned in the HPI and as above.  Physical Exam: General: Alert, no acute distress Cardiovascular: No pedal edema Respiratory: No cyanosis, no use of accessory musculature GI: abdomen  soft Skin: No lesions in the area of chief complaint Neurologic: Sensation intact distally Psychiatric: Patient is competent for consent with normal mood and affect Lymphatic: no lymphedema  MUSCULOSKELETAL: exam stable  Assessment: Right knee medial meniscal tear  Plan: Plan for Procedure(s): RIGHT KNEE ARTHROSCOPY WITH PARTIAL MEDIAL MENISCECTOMY  The risks benefits and alternatives were discussed with the patient including but not limited to the risks of nonoperative treatment, versus surgical intervention including infection, bleeding, nerve injury,  blood clots, cardiopulmonary complications, morbidity, mortality, among others, and they were willing to proceed.   Marianna Payment, MD   09/19/2015 7:28 AM

## 2015-09-19 NOTE — Anesthesia Preprocedure Evaluation (Signed)
Anesthesia Evaluation  Patient identified by MRN, date of birth, ID band Patient awake    Reviewed: Allergy & Precautions, NPO status , Patient's Chart, lab work & pertinent test results  Airway Mallampati: II   Neck ROM: full    Dental   Pulmonary    breath sounds clear to auscultation       Cardiovascular hypertension,  Rhythm:regular Rate:Normal     Neuro/Psych Anxiety    GI/Hepatic   Endo/Other    Renal/GU      Musculoskeletal  (+) Arthritis ,   Abdominal   Peds  Hematology   Anesthesia Other Findings   Reproductive/Obstetrics                             Anesthesia Physical Anesthesia Plan  ASA: II  Anesthesia Plan: General   Post-op Pain Management:    Induction: Intravenous  Airway Management Planned: LMA  Additional Equipment:   Intra-op Plan:   Post-operative Plan:   Informed Consent: I have reviewed the patients History and Physical, chart, labs and discussed the procedure including the risks, benefits and alternatives for the proposed anesthesia with the patient or authorized representative who has indicated his/her understanding and acceptance.     Plan Discussed with: CRNA, Anesthesiologist and Surgeon  Anesthesia Plan Comments:         Anesthesia Quick Evaluation

## 2015-09-19 NOTE — Discharge Instructions (Signed)
Post-operative patient instructions  Knee Arthroscopy    Ice:  Place intermittent ice or cooler pack over your knee, 30 minutes on and 30 minutes off.  Continue this for the first 72 hours after surgery, then save ice for use after therapy sessions or on more active days.    Weight:  You may bear weight on your leg as your symptoms allow.  Crutches:  Use crutches (or walker) to assist in walking until told to discontinue by your physical therapist or physician. This will help to reduce pain.  Strengthening:  Perform simple thigh squeezes (isometric quad contractions) and straight leg lifts as you are able (3 sets of 5 to 10 repetitions, 3 times a day).  For the leg lifts, have someone support under your ankle in the beginning until you have increased strength enough to do this on your own.  To help get started on thigh squeezes, place a pillow under your knee and push down on the pillow with back of knee (sometimes easier to do than with your leg fully straight).  Motion:  Perform gentle knee motion as tolerated - this is gentle bending and straightening of the knee. Seated heel slides: you can start by sitting in a chair, remove your brace, and gently slide your heel back on the floor - allowing your knee to bend. Have someone help you straighten your knee (or use your other leg/foot hooked under your ankle.   Dressing:  Perform 1st dressing change at 2 days postoperative. A moderate amount of blood tinged drainage is to be expected.  So if you bleed through the dressing on the first or second day or if you have fevers, it is fine to change the dressing/check the wounds early and redress wound. Elevate your leg.  If it bleeds through again, or if the incisions are leaking frank blood, please call the office. May change dressing every 1-2 days thereafter to help watch wounds. Can purchase Tegderm (or 40M Nexcare) water resistant dressings at local pharmacy / [employer].  Shower:  Light shower is ok after  2 days.  Please take shower, NO bath. Recover with gauze and ace wrap to help keep wounds protected.    Pain medication:  A narcotic pain medication has been prescribed.  Take as directed.  Typically you need narcotic pain medication more regularly during the first 3 to 5 days after surgery.  Decrease your use of the medication as the pain improves.  Narcotics can sometimes cause constipation, even after a few doses.  If you have problems with constipation, you can take an over the counter stool softener or light laxative.  If you have persistent problems, please notify your physicians office.  Physical therapy: Additional activity guidelines to be provided by your physician or physical therapist at follow-up visits.   Driving: Do not recommend driving x 2 weeks post surgical, especially if surgery performed on right side. Should not drive while taking narcotic pain medications. It typically takes at least 2 weeks to restore sufficient neuromuscular function for normal reaction times for driving safety.   Call [phone] for questions or problems. Evenings you will be forwarded to the hospital operator.  Ask for the orthopaedic physician on call. Please call if you experience:    o Redness, foul smelling, or persistent drainage from the surgical site  o worsening knee pain and swelling not responsive to medication  o any calf pain and or swelling of the lower leg  o temperatures greater than 100.5 F  o other questions or concerns   Thank you for allowing [place] to be a part of your care.  Post Anesthesia Home Care Instructions  Activity: Get plenty of rest for the remainder of the day. A responsible adult should stay with you for 24 hours following the procedure.  For the next 24 hours, DO NOT: -Drive a car -Paediatric nurse -Drink alcoholic beverages -Take any medication unless instructed by your physician -Make any legal decisions or sign important papers.  Meals: Start with liquid  foods such as gelatin or soup. Progress to regular foods as tolerated. Avoid greasy, spicy, heavy foods. If nausea and/or vomiting occur, drink only clear liquids until the nausea and/or vomiting subsides. Call your physician if vomiting continues.  Special Instructions/Symptoms: Your throat may feel dry or sore from the anesthesia or the breathing tube placed in your throat during surgery. If this causes discomfort, gargle with warm salt water. The discomfort should disappear within 24 hours.  If you had a scopolamine patch placed behind your ear for the management of post- operative nausea and/or vomiting:  1. The medication in the patch is effective for 72 hours, after which it should be removed.  Wrap patch in a tissue and discard in the trash. Wash hands thoroughly with soap and water. 2. You may remove the patch earlier than 72 hours if you experience unpleasant side effects which may include dry mouth, dizziness or visual disturbances. 3. Avoid touching the patch. Wash your hands with soap and water after contact with the patch.

## 2015-09-20 ENCOUNTER — Encounter (HOSPITAL_BASED_OUTPATIENT_CLINIC_OR_DEPARTMENT_OTHER): Payer: Self-pay | Admitting: Orthopaedic Surgery

## 2016-01-26 ENCOUNTER — Encounter (HOSPITAL_COMMUNITY): Payer: Self-pay | Admitting: Emergency Medicine

## 2016-01-26 ENCOUNTER — Emergency Department (HOSPITAL_COMMUNITY)
Admission: EM | Admit: 2016-01-26 | Discharge: 2016-01-26 | Disposition: A | Discharge status: Home / Self Care | Payer: Managed Care, Other (non HMO) | Attending: Emergency Medicine | Admitting: Emergency Medicine

## 2016-01-26 DIAGNOSIS — M1990 Unspecified osteoarthritis, unspecified site: Secondary | ICD-10-CM | POA: Insufficient documentation

## 2016-01-26 DIAGNOSIS — R4189 Other symptoms and signs involving cognitive functions and awareness: Secondary | ICD-10-CM

## 2016-01-26 DIAGNOSIS — F1510 Other stimulant abuse, uncomplicated: Secondary | ICD-10-CM | POA: Insufficient documentation

## 2016-01-26 DIAGNOSIS — E785 Hyperlipidemia, unspecified: Secondary | ICD-10-CM | POA: Insufficient documentation

## 2016-01-26 DIAGNOSIS — Z79899 Other long term (current) drug therapy: Secondary | ICD-10-CM | POA: Insufficient documentation

## 2016-01-26 DIAGNOSIS — R55 Syncope and collapse: Secondary | ICD-10-CM | POA: Insufficient documentation

## 2016-01-26 DIAGNOSIS — Z8659 Personal history of other mental and behavioral disorders: Secondary | ICD-10-CM | POA: Insufficient documentation

## 2016-01-26 DIAGNOSIS — I10 Essential (primary) hypertension: Secondary | ICD-10-CM | POA: Insufficient documentation

## 2016-01-26 DIAGNOSIS — Z791 Long term (current) use of non-steroidal anti-inflammatories (NSAID): Secondary | ICD-10-CM | POA: Insufficient documentation

## 2016-01-26 LAB — CBC
HCT: 37.9 % — ABNORMAL LOW (ref 39.0–52.0)
HEMOGLOBIN: 13.1 g/dL (ref 13.0–17.0)
MCH: 32.2 pg (ref 26.0–34.0)
MCHC: 34.6 g/dL (ref 30.0–36.0)
MCV: 93.1 fL (ref 78.0–100.0)
PLATELETS: 241 10*3/uL (ref 150–400)
RBC: 4.07 MIL/uL — AB (ref 4.22–5.81)
RDW: 12.6 % (ref 11.5–15.5)
WBC: 13.4 10*3/uL — AB (ref 4.0–10.5)

## 2016-01-26 LAB — ACETAMINOPHEN LEVEL: Acetaminophen (Tylenol), Serum: <10 ug/mL — ABNORMAL LOW (ref 10–30)

## 2016-01-26 LAB — COMPREHENSIVE METABOLIC PANEL
ALT: 116 U/L — AB (ref 17–63)
AST: 98 U/L — AB (ref 15–41)
Albumin: 4.3 g/dL (ref 3.5–5.0)
Alkaline Phosphatase: 43 U/L (ref 38–126)
Anion gap: 6 (ref 5–15)
BUN: 19 mg/dL (ref 6–20)
CHLORIDE: 103 mmol/L (ref 101–111)
CO2: 29 mmol/L (ref 22–32)
Calcium: 8.3 mg/dL — ABNORMAL LOW (ref 8.9–10.3)
Creatinine, Ser: 1.06 mg/dL (ref 0.61–1.24)
GFR calc Af Amer: >60 mL/min (ref >60)
Glucose, Bld: 85 mg/dL (ref 65–99)
POTASSIUM: 4.6 mmol/L (ref 3.5–5.1)
SODIUM: 138 mmol/L (ref 135–145)
Total Bilirubin: 0.5 mg/dL (ref 0.3–1.2)
Total Protein: 6.5 g/dL (ref 6.5–8.1)

## 2016-01-26 LAB — RAPID URINE DRUG SCREEN, HOSP PERFORMED
AMPHETAMINES: POSITIVE — AB
BENZODIAZEPINES: NOT DETECTED
Barbiturates: NOT DETECTED
Cocaine: NOT DETECTED
OPIATES: NOT DETECTED
TETRAHYDROCANNABINOL: NOT DETECTED

## 2016-01-26 LAB — URINALYSIS, ROUTINE W REFLEX MICROSCOPIC
BILIRUBIN URINE: NEGATIVE
GLUCOSE, UA: NEGATIVE mg/dL
HGB URINE DIPSTICK: NEGATIVE
Ketones, ur: NEGATIVE mg/dL
Leukocytes, UA: NEGATIVE
Nitrite: NEGATIVE
PH: 5.5 (ref 5.0–8.0)
PROTEIN: 30 mg/dL — AB
SPECIFIC GRAVITY, URINE: 1.023 (ref 1.005–1.030)

## 2016-01-26 LAB — URINE MICROSCOPIC-ADD ON
BACTERIA UA: NONE SEEN
RBC / HPF: NONE SEEN RBC/hpf (ref 0–5)
SQUAMOUS EPITHELIAL / LPF: NONE SEEN

## 2016-01-26 LAB — SALICYLATE LEVEL

## 2016-01-26 LAB — ETHANOL

## 2016-01-26 MED ORDER — SODIUM CHLORIDE 0.9 % IV BOLUS (SEPSIS)
1000.0000 mL | Freq: Once | INTRAVENOUS | Status: AC
  Administered 2016-01-26: 1000 mL via INTRAVENOUS

## 2016-01-26 NOTE — ED Notes (Signed)
Pt notified of need for urine  Pt states he cannot void at this time  Family sitting with pt

## 2016-01-26 NOTE — Discharge Instructions (Signed)
Continue medications as before.  Return to the emergency department if your symptoms recur, worsen, or change.

## 2016-01-26 NOTE — ED Provider Notes (Signed)
CSN: [id number]     Arrival date & time 01/26/16  0136 History  By signing my name below, I, Rowan Blase, attest that this documentation has been prepared under the direction and in the presence of Veryl Speak, MD . Electronically Signed: Rowan Blase, Scribe. 01/26/2016. 3:03 AM.   Chief Complaint  Patient presents with  . possible overdose    The history is provided by the patient and a relative. No language interpreter was used.   HPI Comments:  PATON SHAMPINE is a 55 y.o. male with PMHx of HTN, HLD, anxiety and arthritis who presents to the Emergency Department complaining of possible overdose around 2200 yesterday. Son found pt unresponsive on the floor at home with snoring respirations; son performed chest compressions until EMS arrival. EMS administered Narcan and pt regained consciousness ~5 minutes later. Pt states he remembers taking a shower yesterday evening but does not remember anything else. Pt reports he took Hydrocodone today but denies taking any extra tonight. He reports knee surgery in November 2016. Family reports evaluation for possible seizure activity while watching TV ~8 years ago without specific diagnosis. Pt denies ETOH use.  Past Medical History  Diagnosis Date  . Hypertension   . Hyperlipidemia   . Anxiety   . Arthritis    Past Surgical History  Procedure Laterality Date  . Anterior cruciate ligament repair  1989, 2007    left  . Shoulder surgery  1999    right  . Ganglion cyst excision  1998    right wrist  . Removal toe nail  1993    right great toe  . Knee arthroscopy with medial menisectomy Right 09/19/2015    Procedure: RIGHT KNEE ARTHROSCOPY WITH PARTIAL MEDIAL MENISCECTOMY;  Surgeon: Leandrew Koyanagi, MD;  Location: [facility];  Service: Orthopedics;  Laterality: Right;   Family History  Problem Relation Age of Onset  . Colon cancer Neg Hx   . Stomach cancer Neg Hx   . Diabetes Father   . Heart disease Father   .  Hypertension Father   . Alzheimer's disease Mother    Social History  Substance Use Topics  . Smoking status: Never Smoker   . Smokeless tobacco: Never Used  . Alcohol Use: No    Review of Systems  Neurological: Positive for syncope.  Psychiatric/Behavioral: Negative for suicidal ideas and self-injury.  All other systems reviewed and are negative.  Allergies  Morphine and related  Home Medications   Prior to Admission medications   Medication Sig Start Date End Date Taking? Authorizing Provider  amphetamine-dextroamphetamine (ADDERALL) 20 MG tablet Take 20 mg by mouth daily.   Yes Historical Provider, MD  atorvastatin (LIPITOR) 40 MG tablet Take 20 mg by mouth daily.  08/18/12  Yes Historical Provider, MD  diclofenac (VOLTAREN) 75 MG EC tablet Take 75 mg by mouth 2 (two) times daily.   Yes Historical Provider, MD  HYDROcodone-acetaminophen (NORCO/VICODIN) 5-325 MG tablet Take 1 tablet by mouth every 6 (six) hours as needed for moderate pain.   Yes Historical Provider, MD  Misc Natural Products (OSTEO BI-FLEX JOINT SHIELD PO) Take 1 tablet by mouth daily.   Yes Historical Provider, MD  Multiple Vitamin (MULTIVITAMIN) tablet Take 1 tablet by mouth daily.   Yes Historical Provider, MD  nebivolol (BYSTOLIC) 10 MG tablet Take 10 mg by mouth daily.   Yes Historical Provider, MD  HYDROcodone-acetaminophen (NORCO) 7.5-325 MG tablet Take 1-2 tablets by mouth every 6 (six) hours as needed for  moderate pain. Patient not taking: Reported on 01/26/2016 09/19/15   Leandrew Koyanagi, MD   BP 104/61 mmHg  Pulse 84  Temp(Src) 98.4 F (36.9 C) (Oral)  Resp 16  SpO2 94% Physical Exam  Constitutional: He appears well-developed and well-nourished. No distress.  HENT:  Head: Normocephalic and atraumatic.  Mouth/Throat: Oropharynx is clear and moist. No oropharyngeal exudate.  Eyes: Conjunctivae and EOM are normal. Pupils are equal, round, and reactive to light. Right eye exhibits no discharge. Left eye  exhibits no discharge. No scleral icterus.  Neck: Normal range of motion. Neck supple. No JVD present. No thyromegaly present.  Cardiovascular: Normal rate, regular rhythm, normal heart sounds and intact distal pulses.  Exam reveals no gallop and no friction rub.   No murmur heard. Pulmonary/Chest: Effort normal and breath sounds normal. No respiratory distress. He has no wheezes. He has no rales.  Abdominal: Soft. Bowel sounds are normal. He exhibits no distension and no mass. There is no tenderness.  Musculoskeletal: Normal range of motion. He exhibits no edema or tenderness.  Lymphadenopathy:    He has no cervical adenopathy.  Neurological: He is alert. Coordination normal.  Skin: Skin is warm and dry. No rash noted. No erythema.  Psychiatric: He has a normal mood and affect. His behavior is normal.  Nursing note and vitals reviewed.   ED Course  Procedures  DIAGNOSTIC STUDIES:  Oxygen Saturation is 94% on RA, adequate by my interpretation.    COORDINATION OF CARE:  3:08 AM Will order blood work, UA and observe pt. Discussed treatment plan with pt at bedside and pt agreed to plan.  Labs Review Labs Reviewed  COMPREHENSIVE METABOLIC PANEL - Abnormal; Notable for the following:    Calcium 8.3 (*)    AST 98 (*)    ALT 116 (*)    All other components within normal limits  ACETAMINOPHEN LEVEL - Abnormal; Notable for the following:    Acetaminophen (Tylenol), Serum <10 (*)    All other components within normal limits  CBC - Abnormal; Notable for the following:    WBC 13.4 (*)    RBC 4.07 (*)    HCT 37.9 (*)    All other components within normal limits  ETHANOL  SALICYLATE LEVEL  URINE RAPID DRUG SCREEN, HOSP PERFORMED    Imaging Review No results found. I have personally reviewed and evaluated these images and lab results as part of my medical decision-making.   EKG Interpretation   Date/Time:  Saturday January 26 2016 01:47:34 EDT Ventricular Rate:  83 PR Interval:   164 QRS Duration: 90 QT Interval:  368 QTC Calculation: 432 R Axis:   21 Text Interpretation:  Sinus rhythm Normal ECG Confirmed by Alesana Magistro  MD,  Lashawn Bromwell ([id number]) on 01/26/2016 4:10:39 AM      MDM   Final diagnoses:  None    Patient brought for by EMS for evaluation of an unresponsive episode. He was at a bonfire earlier this evening with friends. When he returned home his son found him to be unresponsive and snoring loudly. The son attempted to wake him, however was unsuccessful. At one point, his son reported actually attempting CPR. 911 was called and EMS arrived. He was given Narcan and seemed to wake up immediately. The patient reports taking medication for pain (hydrocodone), but denies taking a larger quantity than normal. He also denies alcohol consumption. His workup reveals unremarkable laboratory studies, negative alcohol level, and tox screen which only shows amphetamines. This is accounted  for by the lateral of the patient takes.  He has been observed for several hours in the emergency department and I see no further similar episodes. He is feeling better and has no complaints and would like to go home.  I personally performed the services described in this documentation, which was scribed in my presence. The recorded information has been reviewed and is accurate.       Veryl Speak, MD 01/26/16 [phone]

## 2016-01-26 NOTE — ED Notes (Signed)
EKG given to EDP,Delo,MD., for review. 

## 2016-01-26 NOTE — ED Notes (Signed)
Per EMS pt was found by his son unresponsive with snoring respirations   30 minutes prior to that the pt had arrived home from a youth group bonfire and was acting normal  Upon EMS arrival pt had 4-6 respirations, pupils were a 3, heart rate in the 80s  Narcan was administered intranasally 2mg   Approximately 10 minutes later after the narcan and assisted ventilation the pt woke up   Pt denies any substance use

## 2016-02-20 ENCOUNTER — Other Ambulatory Visit: Payer: Self-pay | Admitting: Internal Medicine

## 2016-02-20 DIAGNOSIS — R945 Abnormal results of liver function studies: Secondary | ICD-10-CM

## 2016-02-28 ENCOUNTER — Inpatient Hospital Stay (HOSPITAL_COMMUNITY)
Admission: EM | Admit: 2016-02-28 | Discharge: 2016-03-04 | DRG: 100 | Disposition: A | Discharge status: Home / Self Care | Payer: Managed Care, Other (non HMO) | Attending: Internal Medicine | Admitting: Internal Medicine

## 2016-02-28 ENCOUNTER — Encounter (HOSPITAL_COMMUNITY): Payer: Self-pay | Admitting: Emergency Medicine

## 2016-02-28 ENCOUNTER — Ambulatory Visit
Admission: RE | Admit: 2016-02-28 | Discharge: 2016-02-28 | Disposition: A | Discharge status: Home / Self Care | Payer: Managed Care, Other (non HMO) | Source: Ambulatory Visit | Attending: Internal Medicine | Admitting: Internal Medicine

## 2016-02-28 ENCOUNTER — Emergency Department (HOSPITAL_COMMUNITY): Payer: Managed Care, Other (non HMO)

## 2016-02-28 DIAGNOSIS — R945 Abnormal results of liver function studies: Secondary | ICD-10-CM | POA: Diagnosis present

## 2016-02-28 DIAGNOSIS — I959 Hypotension, unspecified: Secondary | ICD-10-CM | POA: Diagnosis not present

## 2016-02-28 DIAGNOSIS — D649 Anemia, unspecified: Secondary | ICD-10-CM | POA: Diagnosis present

## 2016-02-28 DIAGNOSIS — G40909 Epilepsy, unspecified, not intractable, without status epilepticus: Secondary | ICD-10-CM

## 2016-02-28 DIAGNOSIS — R55 Syncope and collapse: Secondary | ICD-10-CM | POA: Diagnosis not present

## 2016-02-28 DIAGNOSIS — I6789 Other cerebrovascular disease: Secondary | ICD-10-CM

## 2016-02-28 DIAGNOSIS — R6889 Other general symptoms and signs: Secondary | ICD-10-CM

## 2016-02-28 DIAGNOSIS — F419 Anxiety disorder, unspecified: Secondary | ICD-10-CM

## 2016-02-28 DIAGNOSIS — R9089 Other abnormal findings on diagnostic imaging of central nervous system: Secondary | ICD-10-CM

## 2016-02-28 DIAGNOSIS — R740 Nonspecific elevation of levels of transaminase and lactic acid dehydrogenase [LDH]: Secondary | ICD-10-CM

## 2016-02-28 DIAGNOSIS — J9601 Acute respiratory failure with hypoxia: Secondary | ICD-10-CM

## 2016-02-28 DIAGNOSIS — M1812 Unilateral primary osteoarthritis of first carpometacarpal joint, left hand: Secondary | ICD-10-CM | POA: Diagnosis present

## 2016-02-28 DIAGNOSIS — I6783 Posterior reversible encephalopathy syndrome: Secondary | ICD-10-CM

## 2016-02-28 DIAGNOSIS — F329 Major depressive disorder, single episode, unspecified: Secondary | ICD-10-CM | POA: Diagnosis present

## 2016-02-28 DIAGNOSIS — E785 Hyperlipidemia, unspecified: Secondary | ICD-10-CM

## 2016-02-28 DIAGNOSIS — R569 Unspecified convulsions: Principal | ICD-10-CM

## 2016-02-28 DIAGNOSIS — Q282 Arteriovenous malformation of cerebral vessels: Secondary | ICD-10-CM

## 2016-02-28 DIAGNOSIS — R7401 Elevation of levels of liver transaminase levels: Secondary | ICD-10-CM

## 2016-02-28 DIAGNOSIS — Q280 Arteriovenous malformation of precerebral vessels: Secondary | ICD-10-CM

## 2016-02-28 DIAGNOSIS — Z79899 Other long term (current) drug therapy: Secondary | ICD-10-CM

## 2016-02-28 DIAGNOSIS — F411 Generalized anxiety disorder: Secondary | ICD-10-CM | POA: Diagnosis present

## 2016-02-28 DIAGNOSIS — M1990 Unspecified osteoarthritis, unspecified site: Secondary | ICD-10-CM

## 2016-02-28 DIAGNOSIS — I10 Essential (primary) hypertension: Secondary | ICD-10-CM

## 2016-02-28 DIAGNOSIS — Z01818 Encounter for other preprocedural examination: Secondary | ICD-10-CM

## 2016-02-28 DIAGNOSIS — Z9889 Other specified postprocedural states: Secondary | ICD-10-CM

## 2016-02-28 DIAGNOSIS — J9602 Acute respiratory failure with hypercapnia: Secondary | ICD-10-CM | POA: Diagnosis not present

## 2016-02-28 DIAGNOSIS — D72829 Elevated white blood cell count, unspecified: Secondary | ICD-10-CM

## 2016-02-28 LAB — CBC WITH DIFFERENTIAL/PLATELET
Basophils Absolute: 0 10*3/uL (ref 0.0–0.1)
Basophils Relative: 0 %
EOS ABS: 0.1 10*3/uL (ref 0.0–0.7)
EOS PCT: 1 %
HCT: 37.8 % — ABNORMAL LOW (ref 39.0–52.0)
Hemoglobin: 12.6 g/dL — ABNORMAL LOW (ref 13.0–17.0)
LYMPHS ABS: 1 10*3/uL (ref 0.7–4.0)
LYMPHS PCT: 8 %
MCH: 31.7 pg (ref 26.0–34.0)
MCHC: 33.3 g/dL (ref 30.0–36.0)
MCV: 95.2 fL (ref 78.0–100.0)
MONO ABS: 1.2 10*3/uL — AB (ref 0.1–1.0)
MONOS PCT: 9 %
Neutro Abs: 10.6 10*3/uL — ABNORMAL HIGH (ref 1.7–7.7)
Neutrophils Relative %: 82 %
PLATELETS: 214 10*3/uL (ref 150–400)
RBC: 3.97 MIL/uL — AB (ref 4.22–5.81)
RDW: 12.7 % (ref 11.5–15.5)
WBC: 12.8 10*3/uL — ABNORMAL HIGH (ref 4.0–10.5)

## 2016-02-28 NOTE — ED Notes (Signed)
EDP at bedside  

## 2016-02-28 NOTE — ED Provider Notes (Signed)
The patient's a 55 year old male, he has a complicated recent medical history after having several episodes of apneic spells, there has been some episodes of seizure-like activity as described by the wife, one of these episodes occurred tonight when the daughter alerted family members that he was not breathing well in bed, he was found to be apneic and did require some CPR performed by the son.  Paramedics gave the patient Narcan there was some increased respirations and now the patient is back to normal, he does not remember the event. On exam the patient has clear heart and lung sounds, clear sensorium, follows commands and has a normal neurologic exam. Vital signs are unremarkable, the patient will need an inpatient workup for seizures and apneic episodes.  I saw and evaluated the patient, reviewed the resident's note and I agree with the findings and plan.   EKG Interpretation  Date/Time:  Thursday February 28 2016 22:44:29 EDT Ventricular Rate:  71 PR Interval:  160 QRS Duration: 98 QT Interval:  387 QTC Calculation: 420 R Axis:   20 Text Interpretation:  Sinus rhythm Abnormal R-wave progression, early transition since last tracing no significant change Confirmed by Kou Gucciardo  MD, Emma-Lee Oddo ([id number]) on 02/28/2016 11:06:48 PM      I personally interpreted the EKG as well as the resident and agree with the interpretation on the resident's chart.  Final diagnoses:  Seizure-like activity ([place])  Transaminitis  Leukocytosis      Noemi Chapel, MD 03/02/16 2034

## 2016-02-28 NOTE — ED Provider Notes (Signed)
CSN: [id number]     Arrival date & time 02/28/16  2238 History   First MD Initiated Contact with Patient 02/28/16 2253     Chief Complaint  Patient presents with  . Apneic Episode      (Consider location/radiation/quality/duration/timing/severity/associated sxs/prior Treatment) HPI   55 year old male with past medical history of hypertension, hyperlipidemia, who presents with apneic episode. Patient has a complicated recent history in the last month and a half of recurrent apneic episodes. Per report from the patient and his wife, his first episode involved an episode of confusion, decreased consciousness, and generalized shaking although he did not fall. He was evaluated at that time and presented to an outside ED with significant apnea. He was given Narcan which improved this on the UDS was negative. He was referred to a neurologist as an outpatient. Earlier today, the patient's son found him with sonorous respirations and bed. He was confused and unresponsive at that time. CPR was begun by the son and EMS arrived at which time fire was bagging the patient. Again received Narcan which resolved his symptoms. However, on assessment the patient strongly denies any opioid use. He is prescribed Norco but does not use this regularly. Denies any family history of seizures. No recent medication changes beyond recently starting Cymbalta and weaning off of Paxil.  Past Medical History  Diagnosis Date  . Hypertension   . Hyperlipidemia   . Anxiety   . Arthritis    Past Surgical History  Procedure Laterality Date  . Anterior cruciate ligament repair  1989, 2007    left  . Shoulder surgery  1999    right  . Ganglion cyst excision  1998    right wrist  . Removal toe nail  1993    right great toe  . Knee arthroscopy with medial menisectomy Right 09/19/2015    Procedure: RIGHT KNEE ARTHROSCOPY WITH PARTIAL MEDIAL MENISCECTOMY;  Surgeon: Leandrew Koyanagi, MD;  Location: [facility];   Service: Orthopedics;  Laterality: Right;   Family History  Problem Relation Age of Onset  . Colon cancer Neg Hx   . Stomach cancer Neg Hx   . Diabetes Father   . Heart disease Father   . Hypertension Father   . Alzheimer's disease Mother    Social History  Substance Use Topics  . Smoking status: Never Smoker   . Smokeless tobacco: Never Used  . Alcohol Use: No    Review of Systems  Constitutional: Negative for fever, chills and fatigue.  HENT: Negative for congestion and rhinorrhea.   Eyes: Negative for visual disturbance.  Respiratory: Negative for cough, shortness of breath and wheezing.   Cardiovascular: Negative for chest pain and leg swelling.  Gastrointestinal: Negative for nausea, vomiting, abdominal pain and diarrhea.  Genitourinary: Negative for dysuria and flank pain.  Musculoskeletal: Negative for neck pain and neck stiffness.  Skin: Negative for rash.  Allergic/Immunologic: Negative for immunocompromised state.  Neurological: Positive for syncope. Negative for weakness and headaches.  Psychiatric/Behavioral: Positive for confusion.      Allergies  Morphine and related  Home Medications   Prior to Admission medications   Medication Sig Start Date End Date Taking? Authorizing Provider  ALPRAZolam (XANAX) 0.25 MG tablet Take 0.25 mg by mouth 2 (two) times daily as needed for anxiety.   Yes Historical Provider, MD  amphetamine-dextroamphetamine (ADDERALL XR) 20 MG 24 hr capsule Take 20 mg by mouth daily. 01/30/16  Yes Historical Provider, MD  atorvastatin (LIPITOR) 40 MG tablet  Take 20 mg by mouth daily.  08/18/12  Yes Historical Provider, MD  DULoxetine (CYMBALTA) 30 MG capsule Take 30 mg by mouth 2 (two) times daily. 02/22/16  Yes Historical Provider, MD  Multiple Vitamin (MULTIVITAMIN) tablet Take 1 tablet by mouth daily.   Yes Historical Provider, MD  nebivolol (BYSTOLIC) 10 MG tablet Take 10 mg by mouth daily.   Yes Historical Provider, MD   HYDROcodone-acetaminophen (NORCO) 7.5-325 MG tablet Take 1-2 tablets by mouth every 6 (six) hours as needed for moderate pain. Patient not taking: Reported on 01/26/2016 09/19/15   Leandrew Koyanagi, MD   BP 109/67 mmHg  Pulse 63  Temp(Src) 98.2 F (36.8 C) (Oral)  Resp 15  Ht 6' (1.829 m)  Wt 88.451 kg  BMI 26.44 kg/m2  SpO2 95% Physical Exam  Constitutional: He is oriented to person, place, and time. He appears well-developed and well-nourished. No distress.  HENT:  Head: Normocephalic.  Mouth/Throat: No oropharyngeal exudate.  Eyes: Conjunctivae are normal. Pupils are equal, round, and reactive to light.  Neck: Normal range of motion. Neck supple.  Cardiovascular: Normal rate, regular rhythm and normal heart sounds.  Exam reveals no friction rub.   No murmur heard. Pulmonary/Chest: Effort normal and breath sounds normal. No respiratory distress. He has no wheezes. He has no rales.  Abdominal: Soft. He exhibits no distension. There is no tenderness.  Musculoskeletal: He exhibits no edema.  Neurological: He is alert and oriented to person, place, and time.  Skin: Skin is warm. No rash noted.  Nursing note and vitals reviewed.   Neurological Exam:  - Mental Status: Alert and oriented to person, place, and time. Attention and concentration normal. Speech clear. Recent memory is intact. - Cranial Nerves: Visual fields intact to confrontation in all quadrants bilaterally. EOMI and PERRLA. No nystagmus noted. Facial sensation intact at forehead, maxillary cheek, and chin/mandible bilaterally. No weakness of masticatory muscles. No facial asymmetry or weakness. Hearing grossly normal to finer rub. Uvula is midline, and palate elevates symmetrically. Normal SCM and trapezius strength. Tongue midline without fasciculations - Motor: Muscle strength 5/5 in proximal and distal UE and LE bilaterally. No pronator drift. Muscle tone normal. - Reflexes: 2+ and symmetrical in all four extremities.  -  Sensation: Intact to light touch and pinprick in upper and lower extremities distally bilaterally.  - Gait: Normal without ataxia. - Coordination: Normal FTN and HTS bilaterally.  ED Course  Procedures (including critical care time) Labs Review Labs Reviewed  CBC WITH DIFFERENTIAL/PLATELET - Abnormal; Notable for the following:    WBC 12.8 (*)    RBC 3.97 (*)    Hemoglobin 12.6 (*)    HCT 37.8 (*)    Neutro Abs 10.6 (*)    Monocytes Absolute 1.2 (*)    All other components within normal limits  COMPREHENSIVE METABOLIC PANEL - Abnormal; Notable for the following:    Glucose, Bld 138 (*)    Calcium 8.8 (*)    Total Protein 5.9 (*)    AST 93 (*)    ALT 104 (*)    All other components within normal limits  ACETAMINOPHEN LEVEL - Abnormal; Notable for the following:    Acetaminophen (Tylenol), Serum <10 (*)    All other components within normal limits  URINALYSIS, ROUTINE W REFLEX MICROSCOPIC (NOT AT  [facility]) - Abnormal; Notable for the following:    Glucose, UA 100 (*)    Protein, ur 30 (*)    All other components within normal limits  COMPREHENSIVE  METABOLIC PANEL - Abnormal; Notable for the following:    Calcium 8.7 (*)    Total Protein 6.0 (*)    AST 83 (*)    ALT 99 (*)    All other components within normal limits  CBC - Abnormal; Notable for the following:    WBC 11.1 (*)    RBC 3.92 (*)    Hemoglobin 12.3 (*)    HCT 37.0 (*)    All other components within normal limits  URINE MICROSCOPIC-ADD ON - Abnormal; Notable for the following:    Squamous Epithelial / LPF 0-5 (*)    Bacteria, UA RARE (*)    Casts HYALINE CASTS (*)    All other components within normal limits  MAGNESIUM  URINE RAPID DRUG SCREEN, HOSP PERFORMED  SALICYLATE LEVEL  HEPATITIS PANEL, ACUTE  PROTIME-INR  APTT  LIPID PANEL    Imaging Review Ct Head Wo Contrast  02/29/2016  CLINICAL DATA:  Syncope, possible apnea. 3-4 possible seizures. History of hypertension hyperlipidemia. EXAM: CT HEAD WITHOUT  CONTRAST TECHNIQUE: Contiguous axial images were obtained from the base of the skull through the vertex without intravenous contrast. COMPARISON:  MRI of the brain November 07, 2007 FINDINGS: INTRACRANIAL CONTENTS: The ventricles and sulci are normal. No intraparenchymal hemorrhage, mass effect nor midline shift. No acute large vascular territory infarcts. RIGHT frontal developmental venous anomaly better demonstrated on prior contrast-enhanced MRI. No abnormal extra-axial fluid collections. Basal cisterns are patent. ORBITS: The included ocular globes and orbital contents are normal. SINUSES: RIGHT maxillary sinus mucosal thickening with air-fluid level. Mild fronto ethmoidal mucosal thickening. Subcentimeter RIGHT frontal osteoma. Mastoid air cells are well aerated. SKULL/SOFT TISSUES: No skull fracture. No significant soft tissue swelling. IMPRESSION: Negative CT head. Mild acute paranasal sinusitis. Electronically Signed   By: Elon Alas M.D.   On: 02/29/2016 00:02   US Abdomen Complete  02/28/2016  CLINICAL DATA:  Abnormal liver function studies, history of hypertension. EXAM: ABDOMEN ULTRASOUND COMPLETE COMPARISON:  None. FINDINGS: Gallbladder: No gallstones or wall thickening visualized. No sonographic Murphy sign noted by sonographer. Common bile duct: Diameter: 3.2 mm Liver: The hepatic echotexture is normal. There is no focal mass or ductal dilation. The surface contour of the liver is normal. IVC: No abnormality visualized. Pancreas: Evaluation of the pancreas was not possible due to excessive bowel gas. Spleen: Size and appearance within normal limits. Right Kidney: Length: 11.8 cm. Echogenicity within normal limits. No mass or hydronephrosis visualized. Left Kidney: Length: 12.4 cm. Echogenicity within normal limits. No mass or hydronephrosis visualized. Abdominal aorta: There is no abdominal aortic aneurysm. Other findings: There is no ascites. IMPRESSION: 1. Normal abdominal ultrasound  examination. No gallstones. If there are clinical concerns of gallbladder dysfunction, a nuclear medicine hepatobiliary scan may be useful. 2. Nonvisualization of the pancreas due to bowel gas. Electronically Signed   By: David  Martinique M.D.   On: 02/28/2016 10:12   I have personally reviewed and evaluated these images and lab results as part of my medical decision-making.   EKG Interpretation   Date/Time:  Thursday February 28 2016 22:44:29 EDT Ventricular Rate:  71 PR Interval:  160 QRS Duration: 98 QT Interval:  387 QTC Calculation: 420 R Axis:   20 Text Interpretation:  Sinus rhythm Abnormal R-wave progression, early  transition since last tracing no significant change Confirmed by MILLER   MD, BRIAN ([id number]) on 02/28/2016 11:06:48 PM      MDM   55 year old male with past medical history of hypertension, hyperlipidemia who  presents with recurrent episodes of shaking, confusion, and AMS. See HPI above. On arrival, VSS and WNL. Exam is as above. Pt is overall very well-appearing. Neuro exam is non-focal. Etiology of recurrent apneic episodes is unclear at this time - primary consideration includes possible seizures, must also consider substance abuse given response to narcan though pt strongly denies any h/o IV or opioid use. No history of laryngospasm or breathing disorders. No recent medication changes. EKG non-ischemic, intervals are WNL without signs of long QT, HOCM, WPW, or other arrhythmogenic abnormality. Will obtain CT head, labs, plan to monitor and likely admit for syncope. Neurology has been consulted, recommends MR Brain WWO contrast. Reviewed prior records from medical record - no prior CT or neuro miaging.  CT head shows NAICA. Pt remains neuro intact. CBC shows mild leukocytosis and anemia. CMP with mild AST, ALT elevation, otherwise unremarkable. Denies EtOH use. Will f/u Neurology, admit for recurrent syncope, possible seizure-like activity. MR ordered. Pt remains neuro intact  with no ectopy on tele.  Clinical Impression: 1. Seizure-like activity ([place])  2. Essential hypertension    3. Hyperlipidemia   4. Anxiety   5. Arthritis   6. Transaminitis   7. Leukocytosis     Disposition: Admit  Condition: Stable  Pt seen in conjunction with Dr. Areta Haber, MD 02/29/16 0244  Noemi Chapel, MD 03/02/16 2033  Noemi Chapel, MD 03/02/16 2034

## 2016-02-28 NOTE — ED Notes (Signed)
Pt in EMS after apenic episode. Fire had to bag, given 2 Narcan intranasally, 2 more IV. Pt responded now A/OX4. Per family 4 episodes in past month. VSS.

## 2016-02-29 ENCOUNTER — Observation Stay (HOSPITAL_COMMUNITY): Payer: Managed Care, Other (non HMO)

## 2016-02-29 ENCOUNTER — Observation Stay (HOSPITAL_BASED_OUTPATIENT_CLINIC_OR_DEPARTMENT_OTHER)
Admit: 2016-02-29 | Discharge: 2016-02-29 | Disposition: A | Discharge status: Home / Self Care | Payer: Managed Care, Other (non HMO) | Attending: Internal Medicine | Admitting: Internal Medicine

## 2016-02-29 DIAGNOSIS — F329 Major depressive disorder, single episode, unspecified: Secondary | ICD-10-CM

## 2016-02-29 DIAGNOSIS — D72829 Elevated white blood cell count, unspecified: Secondary | ICD-10-CM | POA: Diagnosis present

## 2016-02-29 DIAGNOSIS — K7689 Other specified diseases of liver: Secondary | ICD-10-CM

## 2016-02-29 DIAGNOSIS — F419 Anxiety disorder, unspecified: Secondary | ICD-10-CM

## 2016-02-29 DIAGNOSIS — I10 Essential (primary) hypertension: Secondary | ICD-10-CM | POA: Diagnosis present

## 2016-02-29 DIAGNOSIS — R569 Unspecified convulsions: Secondary | ICD-10-CM

## 2016-02-29 DIAGNOSIS — E785 Hyperlipidemia, unspecified: Secondary | ICD-10-CM | POA: Diagnosis present

## 2016-02-29 DIAGNOSIS — R6889 Other general symptoms and signs: Secondary | ICD-10-CM

## 2016-02-29 DIAGNOSIS — F411 Generalized anxiety disorder: Secondary | ICD-10-CM | POA: Diagnosis present

## 2016-02-29 DIAGNOSIS — R945 Abnormal results of liver function studies: Secondary | ICD-10-CM | POA: Diagnosis present

## 2016-02-29 DIAGNOSIS — M1812 Unilateral primary osteoarthritis of first carpometacarpal joint, left hand: Secondary | ICD-10-CM | POA: Diagnosis present

## 2016-02-29 HISTORY — DX: Major depressive disorder, single episode, unspecified: F32.9

## 2016-02-29 LAB — CBC
HEMATOCRIT: 37 % — AB (ref 39.0–52.0)
Hemoglobin: 12.3 g/dL — ABNORMAL LOW (ref 13.0–17.0)
MCH: 31.4 pg (ref 26.0–34.0)
MCHC: 33.2 g/dL (ref 30.0–36.0)
MCV: 94.4 fL (ref 78.0–100.0)
PLATELETS: 212 10*3/uL (ref 150–400)
RBC: 3.92 MIL/uL — ABNORMAL LOW (ref 4.22–5.81)
RDW: 12.7 % (ref 11.5–15.5)
WBC: 11.1 10*3/uL — AB (ref 4.0–10.5)

## 2016-02-29 LAB — RAPID URINE DRUG SCREEN, HOSP PERFORMED
Amphetamines: NOT DETECTED
Barbiturates: NOT DETECTED
Benzodiazepines: NOT DETECTED
Cocaine: NOT DETECTED
Opiates: NOT DETECTED
Tetrahydrocannabinol: NOT DETECTED

## 2016-02-29 LAB — URINE MICROSCOPIC-ADD ON

## 2016-02-29 LAB — COMPREHENSIVE METABOLIC PANEL
ALK PHOS: 49 U/L (ref 38–126)
ALT: 99 U/L — ABNORMAL HIGH (ref 17–63)
AST: 83 U/L — AB (ref 15–41)
Albumin: 3.9 g/dL (ref 3.5–5.0)
Anion gap: 12 (ref 5–15)
BILIRUBIN TOTAL: 0.7 mg/dL (ref 0.3–1.2)
BUN: 16 mg/dL (ref 6–20)
CALCIUM: 8.7 mg/dL — AB (ref 8.9–10.3)
CO2: 24 mmol/L (ref 22–32)
Chloride: 105 mmol/L (ref 101–111)
Creatinine, Ser: 0.96 mg/dL (ref 0.61–1.24)
GFR calc Af Amer: >60 mL/min (ref >60)
Glucose, Bld: 91 mg/dL (ref 65–99)
POTASSIUM: 3.8 mmol/L (ref 3.5–5.1)
Sodium: 141 mmol/L (ref 135–145)
TOTAL PROTEIN: 6 g/dL — AB (ref 6.5–8.1)

## 2016-02-29 LAB — ACETAMINOPHEN LEVEL: Acetaminophen (Tylenol), Serum: <10 ug/mL — ABNORMAL LOW (ref 10–30)

## 2016-02-29 LAB — LIPID PANEL
CHOLESTEROL: 121 mg/dL (ref 0–200)
HDL: 50 mg/dL (ref >40)
LDL Cholesterol: 64 mg/dL (ref 0–99)
TRIGLYCERIDES: 34 mg/dL (ref <150)
Total CHOL/HDL Ratio: 2.4 RATIO
VLDL: 7 mg/dL (ref 0–40)

## 2016-02-29 LAB — URINALYSIS, ROUTINE W REFLEX MICROSCOPIC
BILIRUBIN URINE: NEGATIVE
GLUCOSE, UA: 100 mg/dL — AB
HGB URINE DIPSTICK: NEGATIVE
Ketones, ur: NEGATIVE mg/dL
Leukocytes, UA: NEGATIVE
Nitrite: NEGATIVE
PROTEIN: 30 mg/dL — AB
Specific Gravity, Urine: 1.023 (ref 1.005–1.030)
pH: 5.5 (ref 5.0–8.0)

## 2016-02-29 LAB — COMPREHENSIVE METABOLIC PANEL WITH GFR
ALT: 104 U/L — ABNORMAL HIGH (ref 17–63)
AST: 93 U/L — ABNORMAL HIGH (ref 15–41)
Albumin: 3.8 g/dL (ref 3.5–5.0)
Alkaline Phosphatase: 50 U/L (ref 38–126)
Anion gap: 10 (ref 5–15)
BUN: 17 mg/dL (ref 6–20)
CO2: 27 mmol/L (ref 22–32)
Calcium: 8.8 mg/dL — ABNORMAL LOW (ref 8.9–10.3)
Chloride: 105 mmol/L (ref 101–111)
Creatinine, Ser: 1.11 mg/dL (ref 0.61–1.24)
GFR calc Af Amer: >60 mL/min
GFR calc non Af Amer: >60 mL/min
Glucose, Bld: 138 mg/dL — ABNORMAL HIGH (ref 65–99)
Potassium: 4.7 mmol/L (ref 3.5–5.1)
Sodium: 142 mmol/L (ref 135–145)
Total Bilirubin: 0.5 mg/dL (ref 0.3–1.2)
Total Protein: 5.9 g/dL — ABNORMAL LOW (ref 6.5–8.1)

## 2016-02-29 LAB — SALICYLATE LEVEL: Salicylate Lvl: <4 mg/dL (ref 2.8–30.0)

## 2016-02-29 LAB — PROTIME-INR
INR: 1.1 (ref 0.00–1.49)
PROTHROMBIN TIME: 14.4 s (ref 11.6–15.2)

## 2016-02-29 LAB — GLUCOSE, CAPILLARY: GLUCOSE-CAPILLARY: 73 mg/dL (ref 65–99)

## 2016-02-29 LAB — APTT: aPTT: 25 seconds (ref 24–37)

## 2016-02-29 LAB — MAGNESIUM: MAGNESIUM: 2.1 mg/dL (ref 1.7–2.4)

## 2016-02-29 MED ORDER — LORAZEPAM 2 MG/ML IJ SOLN
INTRAMUSCULAR | Status: AC
  Filled 2016-02-29: qty 1

## 2016-02-29 MED ORDER — LEVETIRACETAM 250 MG PO TABS
500.0000 mg | ORAL_TABLET | Freq: Two times a day (BID) | ORAL | Status: DC
  Administered 2016-02-29 – 2016-03-01 (×3): 500 mg via ORAL
  Filled 2016-02-29: qty 2
  Filled 2016-02-29 (×2): qty 1

## 2016-02-29 MED ORDER — LORAZEPAM 2 MG/ML IJ SOLN
1.0000 mg | Freq: Once | INTRAMUSCULAR | Status: AC
  Administered 2016-02-29: 1 mg via INTRAVENOUS

## 2016-02-29 MED ORDER — ONDANSETRON HCL 4 MG/2ML IJ SOLN: 4.0000 mg | Freq: Four times a day (QID) | INTRAMUSCULAR | Status: DC | PRN

## 2016-02-29 MED ORDER — NEBIVOLOL HCL 10 MG PO TABS
10.0000 mg | ORAL_TABLET | Freq: Every day | ORAL | Status: DC
  Administered 2016-02-29 – 2016-03-03 (×3): 10 mg via ORAL
  Filled 2016-02-29 (×5): qty 1

## 2016-02-29 MED ORDER — DULOXETINE HCL 30 MG PO CPEP
30.0000 mg | ORAL_CAPSULE | Freq: Two times a day (BID) | ORAL | Status: DC
  Administered 2016-02-29 – 2016-03-04 (×9): 30 mg via ORAL
  Filled 2016-02-29 (×9): qty 1

## 2016-02-29 MED ORDER — ONDANSETRON HCL 4 MG PO TABS: 4.0000 mg | ORAL_TABLET | Freq: Four times a day (QID) | ORAL | Status: DC | PRN

## 2016-02-29 MED ORDER — SODIUM CHLORIDE 0.9 % IV SOLN
2000.0000 mg | Freq: Two times a day (BID) | INTRAVENOUS | Status: DC
  Filled 2016-02-29: qty 20

## 2016-02-29 MED ORDER — GADOBENATE DIMEGLUMINE 529 MG/ML IV SOLN
15.0000 mL | Freq: Once | INTRAVENOUS | Status: AC | PRN
  Administered 2016-02-29: 15 mL via INTRAVENOUS

## 2016-02-29 MED ORDER — ADULT MULTIVITAMIN W/MINERALS CH
1.0000 | ORAL_TABLET | Freq: Every day | ORAL | Status: DC
  Administered 2016-02-29 – 2016-03-04 (×5): 1 via ORAL
  Filled 2016-02-29 (×5): qty 1

## 2016-02-29 MED ORDER — SODIUM CHLORIDE 0.9 % IV SOLN
2000.0000 mg | Freq: Once | INTRAVENOUS | Status: AC
  Administered 2016-02-29: 2000 mg via INTRAVENOUS
  Filled 2016-02-29: qty 20

## 2016-02-29 MED ORDER — ALPRAZOLAM 0.25 MG PO TABS
0.2500 mg | ORAL_TABLET | Freq: Two times a day (BID) | ORAL | Status: DC | PRN
  Administered 2016-03-04: 0.25 mg via ORAL
  Filled 2016-02-29: qty 1

## 2016-02-29 MED ORDER — ENOXAPARIN SODIUM 40 MG/0.4ML ~~LOC~~ SOLN
40.0000 mg | SUBCUTANEOUS | Status: DC
  Administered 2016-02-29 – 2016-03-01 (×2): 40 mg via SUBCUTANEOUS
  Filled 2016-02-29 (×2): qty 0.4

## 2016-02-29 MED ORDER — ATORVASTATIN CALCIUM 10 MG PO TABS
20.0000 mg | ORAL_TABLET | Freq: Every day | ORAL | Status: DC
  Administered 2016-02-29 – 2016-03-04 (×5): 20 mg via ORAL
  Filled 2016-02-29 (×2): qty 1
  Filled 2016-02-29 (×2): qty 2
  Filled 2016-02-29: qty 1

## 2016-02-29 MED ORDER — LORAZEPAM 2 MG/ML IJ SOLN
1.0000 mg | INTRAMUSCULAR | Status: DC | PRN
Start: 2016-02-29 — End: 2016-03-04

## 2016-02-29 MED ORDER — SODIUM CHLORIDE 0.9% FLUSH
3.0000 mL | Freq: Two times a day (BID) | INTRAVENOUS | Status: DC
  Administered 2016-02-29 – 2016-03-03 (×5): 3 mL via INTRAVENOUS

## 2016-02-29 MED ORDER — AMPHETAMINE-DEXTROAMPHET ER 10 MG PO CP24
20.0000 mg | ORAL_CAPSULE | Freq: Every day | ORAL | Status: DC
  Administered 2016-02-29 – 2016-03-04 (×5): 20 mg via ORAL
  Filled 2016-02-29 (×5): qty 2

## 2016-02-29 MED ORDER — SODIUM CHLORIDE 0.9 % IV SOLN
INTRAVENOUS | Status: DC
  Administered 2016-02-29: 04:00:00 via INTRAVENOUS
  Administered 2016-03-01: 75 mL via INTRAVENOUS
  Administered 2016-03-02: 08:00:00 via INTRAVENOUS

## 2016-02-29 NOTE — Progress Notes (Signed)
Patient seen and examined  55 y.o. male with hx of HTN and hyperlipidemia who presents with four spells of sz like activity over the last 4 weeks. 2 of the spells happened while he was lying in bed once watching tv and once sleeping. EEG within normal limits,MRI wnl

## 2016-02-29 NOTE — ED Notes (Signed)
Pt taken to MRI  

## 2016-02-29 NOTE — Progress Notes (Signed)
EEG completed; results pending.    

## 2016-02-29 NOTE — Progress Notes (Signed)
Messaged pharmacy for missing keppra IV dose, ordered stat to be sent to 11[facility] for administration.

## 2016-02-29 NOTE — Procedures (Signed)
ELECTROENCEPHALOGRAM REPORT  Date of Study: 02/29/2016  Patient's Name: Nathan Ballard MRN: [id number] Date of Birth: [birth year]  Referring Provider: Dr. Ivor Costa  Clinical History: This is a 55 year old man with seizure-like activity.  Medications: levETIRAcetam (KEPPRA) tablet 500 mg ALPRAZolam (XANAX) tablet 0.25 mg amphetamine-dextroamphetamine (ADDERALL XR) 24 hr capsule 20 mg atorvastatin (LIPITOR) tablet 20 mg DULoxetine (CYMBALTA) DR capsule 30 mg nebivolol (BYSTOLIC) tablet 10 mg  Technical Summary: A multichannel digital EEG recording measured by the international 10-20 system with electrodes applied with paste and impedances below 5000 ohms performed in our laboratory with EKG monitoring in an awake and asleep patient.  Hyperventilation and photic stimulation were performed.  The digital EEG was referentially recorded, reformatted, and digitally filtered in a variety of bipolar and referential montages for optimal display.    Description: The patient is awake and asleep during the recording.  During maximal wakefulness, there is a symmetric, medium voltage 10.5 Hz posterior dominant rhythm that attenuates with eye opening.  The record is symmetric.  During drowsiness and sleep, there is an increase in theta slowing of the background.  Vertex waves and symmetric sleep spindles were seen.  Hyperventilation and photic stimulation did not elicit any abnormalities.  There were no epileptiform discharges or electrographic seizures seen.    EKG lead showed sinus bradycardia at 60 bpm.  Impression: This awake and asleep EEG is normal.    Clinical Correlation: A normal EEG does not exclude a clinical diagnosis of epilepsy. Clinical correlation is advised.   Ellouise Newer, M.D.

## 2016-02-29 NOTE — ED Notes (Signed)
Dr Blaine Hamper in to see patient

## 2016-02-29 NOTE — Progress Notes (Signed)
Interval History:                                                                                                                      Nathan Ballard is an 55 y.o. male patient  admitted with seizure-like spells as described in my consultation note by Dr. Wendee Beavers. 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  He was started on Keppra 500 twice a day last night admission, tolerating well without any side effects. He had not had any seizures since admission. No new neurological symptoms   Past Medical History: Past Medical History  Diagnosis Date  . Hypertension   . Hyperlipidemia   . Anxiety   . Arthritis     Past Surgical History  Procedure Laterality Date  . Anterior cruciate ligament repair  1989, 2007    left  . Shoulder surgery  1999    right  . Ganglion cyst excision  1998    right wrist  . Removal toe nail  1993    right great toe  . Knee arthroscopy with medial menisectomy Right 09/19/2015    Procedure: RIGHT KNEE ARTHROSCOPY WITH PARTIAL MEDIAL MENISCECTOMY;  Surgeon: Leandrew Koyanagi, MD;  Location: [facility];  Service: Orthopedics;  Laterality: Right;    Family History: Family History  Problem Relation Age of Onset  . Colon cancer Neg Hx   . Stomach cancer Neg Hx   . Diabetes Father   . Heart disease Father   . Hypertension Father   . Alzheimer's disease Mother     Social History:   reports that he has never smoked. He has never used smokeless tobacco. He reports that he does not drink alcohol or use illicit drugs.  Allergies:  Allergies  Allergen Reactions  . Morphine And Related Nausea And Vomiting     Medications:                                                                                                                         Current facility-administered medications:  .  0.9 %   sodium chloride infusion, , Intravenous, Continuous, Ivor Costa, MD, Last Rate: 75 mL/hr at 02/29/16 0405 .  ALPRAZolam (XANAX) tablet 0.25 mg, 0.25 mg, Oral, BID PRN, Ivor Costa, MD .  amphetamine-dextroamphetamine (ADDERALL XR) 24 hr capsule 20 mg, 20 mg, Oral,  Daily, Ivor Costa, MD, 20 mg at 02/29/16 1028 .  atorvastatin (LIPITOR) tablet 20 mg, 20 mg, Oral, Daily, Ivor Costa, MD, 20 mg at 02/29/16 1028 .  DULoxetine (CYMBALTA) DR capsule 30 mg, 30 mg, Oral, BID, Ivor Costa, MD, 30 mg at 02/29/16 1028 .  enoxaparin (LOVENOX) injection 40 mg, 40 mg, Subcutaneous, Q24H, Ivor Costa, MD, 40 mg at 02/29/16 1238 .  levETIRAcetam (KEPPRA) tablet 500 mg, 500 mg, Oral, BID, Ivor Costa, MD, 500 mg at 02/29/16 1028 .  LORazepam (ATIVAN) injection 1 mg, 1 mg, Intravenous, PRN, Ivor Costa, MD .  multivitamin with minerals tablet 1 tablet, 1 tablet, Oral, Daily, Ivor Costa, MD, 1 tablet at 02/29/16 1028 .  nebivolol (BYSTOLIC) tablet 10 mg, 10 mg, Oral, Daily, Ivor Costa, MD, 10 mg at 02/29/16 1028 .  ondansetron (ZOFRAN) tablet 4 mg, 4 mg, Oral, Q6H PRN **OR** ondansetron (ZOFRAN) injection 4 mg, 4 mg, Intravenous, Q6H PRN, Ivor Costa, MD .  sodium chloride flush (NS) 0.9 % injection 3 mL, 3 mL, Intravenous, Q12H, Ivor Costa, MD, 3 mL at 02/29/16 0404   Neurologic Examination:                                                                                                     Today's Vitals   02/29/16 0700 02/29/16 1026 02/29/16 1420 02/29/16 1811  BP: 107/58 120/77 120/67 122/63  Pulse: 62 62 75 63  Temp: 98.1 F (36.7 C) 98.2 F (36.8 C) 98 F (36.7 C) 98.9 F (37.2 C)  TempSrc: Oral Oral Oral Oral  Resp: 16 18 18 18   Height:      Weight:      SpO2: 97% 98% 98% 98%  PainSc:        Evaluation of higher integrative functions including: Level of alertness: Alert,  Oriented to time, place and person Speech: fluent, no evidence of dysarthria or aphasia noted.  Test the following cranial nerves: 2-12  grossly intact Motor examination: Normal tone, bulk, full 5/5 motor strength in all 4 extremities Test coordination: Normal finger nose testing, with no evidence of limb appendicular ataxia or abnormal involuntary movements or tremors noted.     Lab Results: Basic Metabolic Panel:  Recent Labs Lab 02/28/16 2348 02/29/16 0109  NA 142 141  K 4.7 3.8  CL 105 105  CO2 27 24  GLUCOSE 138* 91  BUN 17 16  CREATININE 1.11 0.96  CALCIUM 8.8* 8.7*  MG 2.1  --     Liver Function Tests:  Recent Labs Lab 02/28/16 2348 02/29/16 0109  AST 93* 83*  ALT 104* 99*  ALKPHOS 50 49  BILITOT 0.5 0.7  PROT 5.9* 6.0*  ALBUMIN 3.8 3.9   No results for input(s): LIPASE, AMYLASE in the last 168 hours. No results for input(s): AMMONIA in the last 168 hours.  CBC:  Recent Labs Lab 02/28/16 2348 02/29/16 0109  WBC 12.8* 11.1*  NEUTROABS 10.6*  --   HGB 12.6* 12.3*  HCT 37.8* 37.0*  MCV 95.2 94.4  PLT 214 212    Cardiac Enzymes: No  results for input(s): CKTOTAL, CKMB, CKMBINDEX, TROPONINI in the last 168 hours.  Lipid Panel:  Recent Labs Lab 02/29/16 0129  CHOL 121  TRIG 34  HDL 50  CHOLHDL 2.4  VLDL 7  LDLCALC 64    CBG:  Recent Labs Lab 02/29/16 0759  GLUCAP 73    Microbiology: No results found for this or any previous visit.  Imaging: Ct Head Wo Contrast  02/29/2016  CLINICAL DATA:  Syncope, possible apnea. 3-4 possible seizures. History of hypertension hyperlipidemia. EXAM: CT HEAD WITHOUT CONTRAST TECHNIQUE: Contiguous axial images were obtained from the base of the skull through the vertex without intravenous contrast. COMPARISON:  MRI of the brain November 07, 2007 FINDINGS: INTRACRANIAL CONTENTS: The ventricles and sulci are normal. No intraparenchymal hemorrhage, mass effect nor midline shift. No acute large vascular territory infarcts. RIGHT frontal developmental venous anomaly better demonstrated on prior contrast-enhanced MRI. No abnormal extra-axial  fluid collections. Basal cisterns are patent. ORBITS: The included ocular globes and orbital contents are normal. SINUSES: RIGHT maxillary sinus mucosal thickening with air-fluid level. Mild fronto ethmoidal mucosal thickening. Subcentimeter RIGHT frontal osteoma. Mastoid air cells are well aerated. SKULL/SOFT TISSUES: No skull fracture. No significant soft tissue swelling. IMPRESSION: Negative CT head. Mild acute paranasal sinusitis. Electronically Signed   By: Elon Alas M.D.   On: 02/29/2016 00:02   Mr Jeri Cos [id number] Contrast  02/29/2016  CLINICAL DATA:  Possible overdose yesterday evening, found unresponsive on floor. Evaluate seizure like activity. EXAM: MRI HEAD WITHOUT AND WITH CONTRAST TECHNIQUE: Multiplanar, multiecho pulse sequences of the brain and surrounding structures were obtained without and with intravenous contrast. CONTRAST:  15 cc MultiHance COMPARISON:  CT head February 28, 2016 and MRI of the brain November 07, 2007 FINDINGS: The ventricles and sulci are normal for patient's age. No abnormal parenchymal signal, mass lesions, mass effect. No abnormal parenchymal enhancement. Re- demonstration of RIGHT frontal developmental venous anomaly. No susceptibility artifact is suggests associated cavernoma. A few punctate supratentorial white matter FLAIR T2 hyperintensities are less than expected for age, most compatible with chronic small vessel ischemic disease. No reduced diffusion to suggest acute ischemia. Hippocampi demonstrate symmetric size, morphology and signal. No abnormal extra-axial fluid collections. No extra-axial masses nor leptomeningeal enhancement. Normal major intracranial vascular flow voids seen at the skull base. Ocular globes and orbital contents are unremarkable though not tailored for evaluation. No suspicious calvarial bone marrow signal. No abnormal sellar expansion. Craniocervical junction maintained. RIGHT maxillary sinus air-fluid level. Mild paranasal sinus mucosal  thickening. Mastoid air cells are well aerated. IMPRESSION: No acute intracranial process. Stable RIGHT frontal developmental venous anomaly ; otherwise negative MRI of the brain with contrast for age. Electronically Signed   By: Elon Alas M.D.   On: 02/29/2016 02:56   US Abdomen Complete  02/28/2016  CLINICAL DATA:  Abnormal liver function studies, history of hypertension. EXAM: ABDOMEN ULTRASOUND COMPLETE COMPARISON:  None. FINDINGS: Gallbladder: No gallstones or wall thickening visualized. No sonographic Murphy sign noted by sonographer. Common bile duct: Diameter: 3.2 mm Liver: The hepatic echotexture is normal. There is no focal mass or ductal dilation. The surface contour of the liver is normal. IVC: No abnormality visualized. Pancreas: Evaluation of the pancreas was not possible due to excessive bowel gas. Spleen: Size and appearance within normal limits. Right Kidney: Length: 11.8 cm. Echogenicity within normal limits. No mass or hydronephrosis visualized. Left Kidney: Length: 12.4 cm. Echogenicity within normal limits. No mass or hydronephrosis visualized. Abdominal aorta: There is no abdominal aortic  aneurysm. Other findings: There is no ascites. IMPRESSION: 1. Normal abdominal ultrasound examination. No gallstones. If there are clinical concerns of gallbladder dysfunction, a nuclear medicine hepatobiliary scan may be useful. 2. Nonvisualization of the pancreas due to bowel gas. Electronically Signed   By: David  Martinique M.D.   On: 02/28/2016 10:12    Assessment and plan:   FELIZ WIXON is an 55 y.o. male patient admitted with seizure-like spells as described in my consultation note by Dr. Wendee Beavers. 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 He was started on Keppra 500 twice a day last night admission, tolerating well without any side  effects. He had not had any seizures since admission. No new neurological symptoms No further neurodiagnostic testing of any medication changes from neurology standpoint. He can be discharged home on Keppra 500 mg twice a day, follow-up with outpatient neurology.  We'll sign off.      \

## 2016-02-29 NOTE — Consult Note (Signed)
Neurology Consultation Reason for Consult: TIA Referring Physician: Dr Sabra Heck  CC: seizures  History is obtained from patient and family  HPI: Nathan Ballard is a 55 y.o. male with hx of HTN and hyperlipidemia who presents with four spells of sz like activity over the last 4 weeks.  2 of the spells happened while he was lying in bed once watching tv and once sleeping.  The other day had spell while he was standing in the kitchen and became rigid and then became somnolent for a while.  Today happened while watching tv and stopped breathing apaprently and family was afraid he had a cardiac arrest and performed CPR for a while.  Also had a spell 10 years ago at his dad's home where he turned red and not speaking.  Has never had a GTC however and no urinary or bowel incontinence.  MRI brain shows a venous anomaly.   ROS: A 14 point ROS was performed and is negative except as noted in the HPI.   Past Medical History  Diagnosis Date  . Hypertension   . Hyperlipidemia   . Anxiety   . Arthritis     Family History  Problem Relation Age of Onset  . Colon cancer Neg Hx   . Stomach cancer Neg Hx   . Diabetes Father   . Heart disease Father   . Hypertension Father   . Alzheimer's disease Mother     Social History:  reports that he has never smoked. He has never used smokeless tobacco. He reports that he does not drink alcohol or use illicit drugs.  Exam: Current vital signs: BP 123/77 mmHg  Pulse 71  Temp(Src) 98.2 F (36.8 C) (Oral)  Resp 12  Ht 6' (1.829 m)  Wt 88.451 kg (195 lb)  BMI 26.44 kg/m2  SpO2 96% Vital signs in last 24 hours: Temp:  [98.2 F (36.8 C)] 98.2 F (36.8 C) (04/20 2244) Pulse Rate:  [71] 71 (04/20 2245) Resp:  [9-12] 12 (04/20 2245) BP: (123)/(77) 123/77 mmHg (04/20 2245) SpO2:  [95 %-96 %] 96 % (04/20 2245) Weight:  [88.451 kg (195 lb)] 88.451 kg (195 lb) (04/20 2244)   Physical Exam  Constitutional: Appears well-developed and well-nourished.   Psych: Affect appropriate to situation Eyes: No scleral injection HENT: No OP obstrucion Head: Normocephalic.  Cardiovascular: Normal rate and regular rhythm.  Respiratory: Effort normal and breath sounds normal to anterior ascultation GI: Soft.  No distension. There is no tenderness.  Skin: WDI  Neuro: Mental Status: Patient is awake, alert, oriented to person, place, month, year, and situation. Patient is able to give a clear and coherent history No signs of aphasia or neglect Cranial Nerves: II: Visual Fields are full. Pupils are equal, round, and reactive to light. III,IV, VI: EOMI without ptosis or diploplia.  V: Facial sensation is symmetric to temperature VII: Facial movement is symmetric.  VIII: hearing is intact to voice X: Uvula elevates symmetrically XI: Shoulder shrug is symmetric. XII: tongue is midline without atrophy or fasciculations.  Motor: Tone is normal. Bulk is normal. 5/5 strength was present in all four extremities. Sensory: Sensation is symmetric to light touch and temperature in the arms and legs Deep Tendon Reflexes: 2+ and symmetric in the biceps and patellae. Plantars: Toes are downgoing bilaterally. Cerebellar: FNF and HKS are intact bilaterally    I have reviewed labs in epic and the results pertinent to this consultation are:  I have reviewed the images obtained - i personally  reviewed MRI and agree with findings.  Impression: seizrues.  Load with keppra. EEG in am.  Keppra 500bid after.  If normal exam might be dc after EEG.  I had long and detailed discussion with him and wife regardign no driving and he agreed.  i had specific discussion about danger of dying or killing someone else if he is to have a sz while driving. Both he and wife verbalized understanding.

## 2016-02-29 NOTE — H&P (Signed)
History and Physical    Nathan Ballard [id number] DOB: [birth year] DOA: 02/28/2016  Referring MD/NP/PA:   PCP: Jani Gravel, MD   Outpatient Specialists: none Patient coming from:  Home   Chief Complaint: episodic apnea and shaking  HPI: Nathan Ballard is a 55 y.o. male with medical history significant of hypertension, hyperlipidemia, depression, anxiety, ADD, arthritis, who presents with episodic apnea and shaking.  Per pt's wife, pt has had 4 episodes of episodic apnea and shaking in one month. His first episode involved an episode of confusion and generalized shaking in arms and legs. He was evaluated at that time in an outside ED. He was given Narcan which improved his symptoms. He was referred to a neurologist as an outpatient. Per his wife, pt had another episode of apnea with both arm shaking today. He was confused and unresponsive at that time. CPR was begun by the son. EMS gave dose of  Narcan which resolved his symptoms. Patient denies drug abuse or opioid use. He is prescribed Norco but does not use this regularly. His UDS is negative today. No hx of seizure. No recent medication changes except for recently starting Cymbalta and weaning off of Paxil. Patient does not have chest pain, cough, fever, chills, nausea, vomiting, abdominal pain, symptoms of UTI or unilateral weakness.  ED Course: pt was found to have negative UDS, tylenol level less than 10, salicylate level less than 4, WBC 12.8, temperature normal, no tachycardia, electrolytes and renal function okay. Abnormal liver function with AST 93, ALT 104, normal total bilirubin and normal ALP. Abdominal ultrasound is negative for acute abnormalities. CT head is negative for acute intracranial abnormalities. MRI showed no acute intracranial process, but has stable RIGHT frontal developmental venous anomaly. Patient is admitted to inpatient for further eval and treatment. Neurology was consulted.   Can patient participate in  ADLs?  Yes   Review of Systems:   General: no fevers, chills, no changes in body weight, has poor appetite, has fatigue HEENT: no blurry vision, hearing changes or sore throat Pulm: has dyspnea, no coughing, wheezing CV: no chest pain, no palpitations Abd: no nausea, vomiting, abdominal pain, diarrhea, constipation GU: no dysuria, burning on urination, increased urinary frequency, hematuria  Ext: no leg edema Neuro: no unilateral weakness, numbness, or tingling, no vision change or hearing loss. Has body shaking and confusion. Skin: no rash MSK: No muscle spasm, no deformity, no limitation of range of movement in spin Heme: No easy bruising.  Travel history: No recent long distant travel.  Allergy:  Allergies  Allergen Reactions  . Morphine And Related Nausea And Vomiting    Past Medical History  Diagnosis Date  . Hypertension   . Hyperlipidemia   . Anxiety   . Arthritis     Past Surgical History  Procedure Laterality Date  . Anterior cruciate ligament repair  1989, 2007    left  . Shoulder surgery  1999    right  . Ganglion cyst excision  1998    right wrist  . Removal toe nail  1993    right great toe  . Knee arthroscopy with medial menisectomy Right 09/19/2015    Procedure: RIGHT KNEE ARTHROSCOPY WITH PARTIAL MEDIAL MENISCECTOMY;  Surgeon: Leandrew Koyanagi, MD;  Location: [facility];  Service: Orthopedics;  Laterality: Right;    Social History:  reports that he has never smoked. He has never used smokeless tobacco. He reports that he does not drink alcohol or use illicit drugs.  Family History:  Family History  Problem Relation Age of Onset  . Colon cancer Neg Hx   . Stomach cancer Neg Hx   . Diabetes Father   . Heart disease Father   . Hypertension Father   . Alzheimer's disease Mother      Prior to Admission medications   Medication Sig Start Date End Date Taking? Authorizing Provider  ALPRAZolam (XANAX) 0.25 MG tablet Take 0.25 mg by mouth 2  (two) times daily as needed for anxiety.   Yes Historical Provider, MD  amphetamine-dextroamphetamine (ADDERALL XR) 20 MG 24 hr capsule Take 20 mg by mouth daily. 01/30/16  Yes Historical Provider, MD  atorvastatin (LIPITOR) 40 MG tablet Take 20 mg by mouth daily.  08/18/12  Yes Historical Provider, MD  DULoxetine (CYMBALTA) 30 MG capsule Take 30 mg by mouth 2 (two) times daily. 02/22/16  Yes Historical Provider, MD  Multiple Vitamin (MULTIVITAMIN) tablet Take 1 tablet by mouth daily.   Yes Historical Provider, MD  nebivolol (BYSTOLIC) 10 MG tablet Take 10 mg by mouth daily.   Yes Historical Provider, MD  HYDROcodone-acetaminophen (NORCO) 7.5-325 MG tablet Take 1-2 tablets by mouth every 6 (six) hours as needed for moderate pain. Patient not taking: Reported on 01/26/2016 09/19/15   Leandrew Koyanagi, MD    Physical Exam: Filed Vitals:   02/29/16 0030 02/29/16 0040 02/29/16 0232 02/29/16 0251  BP: 109/67  109/67 116/67  Pulse: 57  63 59  Temp:  98.2 F (36.8 C)  98.4 F (36.9 C)  TempSrc:    Oral  Resp: 15  15 16   Height:      Weight:      SpO2: 91%  95% 99%   General: Not in acute distress HEENT:       Eyes: PERRL, EOMI, no scleral icterus.       ENT: No discharge from the ears and nose, no pharynx injection, no tonsillar enlargement.        Neck: No JVD, no bruit, no mass felt. Heme: No neck lymph node enlargement. Cardiac: S1/S2, RRR, No murmurs, No gallops or rubs. Pulm:  No rales, wheezing, rhonchi or rubs. Abd: Soft, nondistended, nontender, no rebound pain, no organomegaly, BS present. GU: No hematuria Ext: No pitting leg edema bilaterally. 2+DP/PT pulse bilaterally. Musculoskeletal: No joint deformities, No joint redness or warmth, no limitation of ROM in spin. Skin: No rashes. Neuro: Alert, oriented X3, cranial nerves II-XII grossly intact, moves all extremities normally. Muscle strength 5/5 in all extremities, sensation to light touch intact. Knee reflex 1+ bilaterally. Negative  Babinski's sign. Normal finger to nose test. Psych: Patient is not psychotic, no suicidal or hemocidal ideation.  Labs on Admission: I have personally reviewed following labs and imaging studies  CBC:  Recent Labs Lab 02/28/16 2348 02/29/16 0109  WBC 12.8* 11.1*  NEUTROABS 10.6*  --   HGB 12.6* 12.3*  HCT 37.8* 37.0*  MCV 95.2 94.4  PLT 214 [id number]   Basic Metabolic Panel:  Recent Labs Lab 02/28/16 2348 02/29/16 0109  NA 142 141  K 4.7 3.8  CL 105 105  CO2 27 24  GLUCOSE 138* 91  BUN 17 16  CREATININE 1.11 0.96  CALCIUM 8.8* 8.7*  MG 2.1  --    GFR: Estimated Creatinine Clearance: 96.6 mL/min (by C-G formula based on Cr of 0.96). Liver Function Tests:  Recent Labs Lab 02/28/16 2348 02/29/16 0109  AST 93* 83*  ALT 104* 99*  ALKPHOS 50 49  BILITOT 0.5  0.7  PROT 5.9* 6.0*  ALBUMIN 3.8 3.9   No results for input(s): LIPASE, AMYLASE in the last 168 hours. No results for input(s): AMMONIA in the last 168 hours. Coagulation Profile: No results for input(s): INR, PROTIME in the last 168 hours. Cardiac Enzymes: No results for input(s): CKTOTAL, CKMB, CKMBINDEX, TROPONINI in the last 168 hours. BNP (last 3 results) No results for input(s): PROBNP in the last 8760 hours. HbA1C: No results for input(s): HGBA1C in the last 72 hours. CBG: No results for input(s): GLUCAP in the last 168 hours. Lipid Profile:  Recent Labs  02/29/16 0129  CHOL 121  HDL 50  LDLCALC 64  TRIG 34  CHOLHDL 2.4   Thyroid Function Tests: No results for input(s): TSH, T4TOTAL, FREET4, T3FREE, THYROIDAB in the last 72 hours. Anemia Panel: No results for input(s): VITAMINB12, FOLATE, FERRITIN, TIBC, IRON, RETICCTPCT in the last 72 hours. Urine analysis:    Component Value Date/Time   COLORURINE YELLOW 02/29/2016 0138   APPEARANCEUR CLEAR 02/29/2016 0138   LABSPEC 1.023 02/29/2016 0138   PHURINE 5.5 02/29/2016 0138   GLUCOSEU 100* 02/29/2016 0138   HGBUR NEGATIVE 02/29/2016 0138    BILIRUBINUR NEGATIVE 02/29/2016 0138   KETONESUR NEGATIVE 02/29/2016 0138   PROTEINUR 30* 02/29/2016 0138   UROBILINOGEN 0.2 11/06/2007 2042   NITRITE NEGATIVE 02/29/2016 0138   LEUKOCYTESUR NEGATIVE 02/29/2016 0138   Sepsis Labs: @LABRCNTIP (procalcitonin:4,lacticidven:4) )No results found for this or any previous visit (from the past 240 hour(s)).   Radiological Exams on Admission: Ct Head Wo Contrast  02/29/2016  CLINICAL DATA:  Syncope, possible apnea. 3-4 possible seizures. History of hypertension hyperlipidemia. EXAM: CT HEAD WITHOUT CONTRAST TECHNIQUE: Contiguous axial images were obtained from the base of the skull through the vertex without intravenous contrast. COMPARISON:  MRI of the brain November 07, 2007 FINDINGS: INTRACRANIAL CONTENTS: The ventricles and sulci are normal. No intraparenchymal hemorrhage, mass effect nor midline shift. No acute large vascular territory infarcts. RIGHT frontal developmental venous anomaly better demonstrated on prior contrast-enhanced MRI. No abnormal extra-axial fluid collections. Basal cisterns are patent. ORBITS: The included ocular globes and orbital contents are normal. SINUSES: RIGHT maxillary sinus mucosal thickening with air-fluid level. Mild fronto ethmoidal mucosal thickening. Subcentimeter RIGHT frontal osteoma. Mastoid air cells are well aerated. SKULL/SOFT TISSUES: No skull fracture. No significant soft tissue swelling. IMPRESSION: Negative CT head. Mild acute paranasal sinusitis. Electronically Signed   By: Elon Alas M.D.   On: 02/29/2016 00:02   Mr Jeri Cos [id number] Contrast  02/29/2016  CLINICAL DATA:  Possible overdose yesterday evening, found unresponsive on floor. Evaluate seizure like activity. EXAM: MRI HEAD WITHOUT AND WITH CONTRAST TECHNIQUE: Multiplanar, multiecho pulse sequences of the brain and surrounding structures were obtained without and with intravenous contrast. CONTRAST:  15 cc MultiHance COMPARISON:  CT head February 28, 2016 and MRI of the brain November 07, 2007 FINDINGS: The ventricles and sulci are normal for patient's age. No abnormal parenchymal signal, mass lesions, mass effect. No abnormal parenchymal enhancement. Re- demonstration of RIGHT frontal developmental venous anomaly. No susceptibility artifact is suggests associated cavernoma. A few punctate supratentorial white matter FLAIR T2 hyperintensities are less than expected for age, most compatible with chronic small vessel ischemic disease. No reduced diffusion to suggest acute ischemia. Hippocampi demonstrate symmetric size, morphology and signal. No abnormal extra-axial fluid collections. No extra-axial masses nor leptomeningeal enhancement. Normal major intracranial vascular flow voids seen at the skull base. Ocular globes and orbital contents are unremarkable though not tailored for  evaluation. No suspicious calvarial bone marrow signal. No abnormal sellar expansion. Craniocervical junction maintained. RIGHT maxillary sinus air-fluid level. Mild paranasal sinus mucosal thickening. Mastoid air cells are well aerated. IMPRESSION: No acute intracranial process. Stable RIGHT frontal developmental venous anomaly ; otherwise negative MRI of the brain with contrast for age. Electronically Signed   By: Elon Alas M.D.   On: 02/29/2016 02:56   US Abdomen Complete  02/28/2016  CLINICAL DATA:  Abnormal liver function studies, history of hypertension. EXAM: ABDOMEN ULTRASOUND COMPLETE COMPARISON:  None. FINDINGS: Gallbladder: No gallstones or wall thickening visualized. No sonographic Murphy sign noted by sonographer. Common bile duct: Diameter: 3.2 mm Liver: The hepatic echotexture is normal. There is no focal mass or ductal dilation. The surface contour of the liver is normal. IVC: No abnormality visualized. Pancreas: Evaluation of the pancreas was not possible due to excessive bowel gas. Spleen: Size and appearance within normal limits. Right Kidney: Length: 11.8  cm. Echogenicity within normal limits. No mass or hydronephrosis visualized. Left Kidney: Length: 12.4 cm. Echogenicity within normal limits. No mass or hydronephrosis visualized. Abdominal aorta: There is no abdominal aortic aneurysm. Other findings: There is no ascites. IMPRESSION: 1. Normal abdominal ultrasound examination. No gallstones. If there are clinical concerns of gallbladder dysfunction, a nuclear medicine hepatobiliary scan may be useful. 2. Nonvisualization of the pancreas due to bowel gas. Electronically Signed   By: David  Martinique M.D.   On: 02/28/2016 10:12     EKG: Independently reviewed. QTC 420, TWI only in lead III, early R-wave progression  Assessment/Plan Principal Problem:   Seizure ([place]) Active Problems:   Hypertension   Hyperlipidemia   Anxiety   Arthritis   Abnormal liver function   Spells ([place])   Depression   Essential hypertension   Leukocytosis   Seizure-like activity ([place])   Seizure ([place]): pt's recurrent symptoms are concerning for seizure. Neurology was consulted, Dr. Wendee Beavers saw pt, recommended starting Keppra and EEG. -will admit to tele bed for observation -Highly appreciated Dr. Jorge Mandril consultation, follow-up recommendations. -loaded with keppra and then 500 mg bid per Dr. Wendee Beavers -EEG -Seizure precaution -When necessary Ativan for seizure  Depression and anxiety: Stable, no suicidal or homicidal ideations. -Continue home medications: When necessary Xanax, Cymbalta  Hypertension:  -Bystolic  HLD: Last LDL was 93 on 11/08/07 -Continue home medications: Lipitor -Check FLP  Abnormal liver function: Etiology is not clear. Patient does not drink alcohol. Very rarely uses Tylenol -check hepatitis panel  Leukocytosis: no signs of infection. Likely due to stress induced to demargination -follow up by CBC   DVT ppx: SQ Lovenox Code Status: Full code Family Communication: Yes, patient's wife at bed side Disposition Plan:  Anticipate discharge back  to previous home environment Consults called: Neuro, Dr. Wendee Beavers Admission status:  obs / tele   Date of Service 02/29/2016    Ivor Costa [facility] Pager [phone]  If 7PM-7AM, please contact night-coverage [email or web address] Password [email or web address] 02/29/2016, 3:53 AM

## 2016-02-29 NOTE — Care Management Note (Signed)
Case Management Note  Patient Details  Name: Nathan Ballard MRN: [id number] Date of Birth: [birth year]  Subjective/Objective:    Pt admitted with seizure. He is from home.                Action/Plan: Awaiting further medical work up. CM following for discharge needs.   Expected Discharge Date:                  Expected Discharge Plan:     In-House Referral:     Discharge planning Services     Post Acute Care Choice:    Choice offered to:     DME Arranged:    DME Agency:     HH Arranged:    HH Agency:     Status of Service:  In process, will continue to follow  Medicare Important Message Given:    Date Medicare IM Given:    Medicare IM give by:    Date Additional Medicare IM Given:    Additional Medicare Important Message give by:     If discussed at [place] of Stay Meetings, dates discussed:    Additional Comments:  Pollie Friar, RN 02/29/2016, 11:06 AM

## 2016-02-29 NOTE — Progress Notes (Signed)
Request made to material distribution for IV pump, pending.  New admit from ER for seizure. Seizure precautions implemented and pads placed on siderails. Family to bedside. Patient alert and oriented x4. Denies pain. Placed on cardiac monitor. First dose keppra IV to be administered when available on unit per MD. callbell in reach. Will continue to monitor.

## 2016-03-01 ENCOUNTER — Observation Stay (HOSPITAL_COMMUNITY): Payer: Managed Care, Other (non HMO)

## 2016-03-01 ENCOUNTER — Inpatient Hospital Stay (HOSPITAL_COMMUNITY): Payer: Managed Care, Other (non HMO)

## 2016-03-01 ENCOUNTER — Encounter (HOSPITAL_COMMUNITY): Payer: Self-pay | Admitting: Radiology

## 2016-03-01 DIAGNOSIS — F329 Major depressive disorder, single episode, unspecified: Secondary | ICD-10-CM | POA: Diagnosis present

## 2016-03-01 DIAGNOSIS — E785 Hyperlipidemia, unspecified: Secondary | ICD-10-CM | POA: Diagnosis present

## 2016-03-01 DIAGNOSIS — R55 Syncope and collapse: Secondary | ICD-10-CM

## 2016-03-01 DIAGNOSIS — F419 Anxiety disorder, unspecified: Secondary | ICD-10-CM | POA: Diagnosis present

## 2016-03-01 DIAGNOSIS — M1990 Unspecified osteoarthritis, unspecified site: Secondary | ICD-10-CM | POA: Diagnosis present

## 2016-03-01 DIAGNOSIS — Z79899 Other long term (current) drug therapy: Secondary | ICD-10-CM | POA: Diagnosis not present

## 2016-03-01 DIAGNOSIS — R569 Unspecified convulsions: Secondary | ICD-10-CM | POA: Diagnosis present

## 2016-03-01 DIAGNOSIS — R930 Abnormal findings on diagnostic imaging of skull and head, not elsewhere classified: Secondary | ICD-10-CM | POA: Diagnosis not present

## 2016-03-01 DIAGNOSIS — Q282 Arteriovenous malformation of cerebral vessels: Secondary | ICD-10-CM | POA: Diagnosis not present

## 2016-03-01 DIAGNOSIS — J9601 Acute respiratory failure with hypoxia: Secondary | ICD-10-CM | POA: Diagnosis not present

## 2016-03-01 DIAGNOSIS — I959 Hypotension, unspecified: Secondary | ICD-10-CM | POA: Diagnosis not present

## 2016-03-01 DIAGNOSIS — Q280 Arteriovenous malformation of precerebral vessels: Secondary | ICD-10-CM | POA: Diagnosis not present

## 2016-03-01 DIAGNOSIS — I10 Essential (primary) hypertension: Secondary | ICD-10-CM | POA: Diagnosis present

## 2016-03-01 DIAGNOSIS — D72829 Elevated white blood cell count, unspecified: Secondary | ICD-10-CM | POA: Diagnosis present

## 2016-03-01 DIAGNOSIS — K7689 Other specified diseases of liver: Secondary | ICD-10-CM | POA: Diagnosis not present

## 2016-03-01 DIAGNOSIS — J9602 Acute respiratory failure with hypercapnia: Secondary | ICD-10-CM | POA: Diagnosis not present

## 2016-03-01 DIAGNOSIS — D649 Anemia, unspecified: Secondary | ICD-10-CM | POA: Diagnosis present

## 2016-03-01 HISTORY — PX: TRANSTHORACIC ECHOCARDIOGRAM: SHX275

## 2016-03-01 LAB — BLOOD GAS, VENOUS
Acid-Base Excess: 1.3 mmol/L (ref 0.0–2.0)
Bicarbonate: 24.4 mEq/L — ABNORMAL HIGH (ref 20.0–24.0)
Drawn by: 439721
FIO2: 0.4
MODE: POSITIVE
O2 Saturation: 74.7 %
PATIENT TEMPERATURE: 98.6
PCO2 VEN: 32.4 mmHg — AB (ref 45.0–50.0)
PEEP/CPAP: 5 cmH2O
PH VEN: 7.49 — AB (ref 7.250–7.300)
PRESSURE SUPPORT: 5 cmH2O
TCO2: 25.4 mmol/L (ref 0–100)
pO2, Ven: 37.2 mmHg (ref 31.0–45.0)

## 2016-03-01 LAB — POCT I-STAT 3, ART BLOOD GAS (G3+)
ACID-BASE DEFICIT: 3 mmol/L — AB (ref 0.0–2.0)
Acid-base deficit: 2 mmol/L (ref 0.0–2.0)
BICARBONATE: 26.3 meq/L — AB (ref 20.0–24.0)
Bicarbonate: 23 mEq/L (ref 20.0–24.0)
O2 SAT: 99 %
O2 Saturation: 100 %
PCO2 ART: 65.1 mmHg — AB (ref 35.0–45.0)
PCO2 ART: 38.5 mmHg (ref 35.0–45.0)
PH ART: 7.381 (ref 7.350–7.450)
PO2 ART: 363 mmHg — AB (ref 80.0–100.0)
PO2 ART: 142 mmHg — AB (ref 80.0–100.0)
Patient temperature: 98.6
Patient temperature: 97.5
TCO2: 28 mmol/L (ref 0–100)
TCO2: 24 mmol/L (ref 0–100)
pH, Arterial: 7.214 — ABNORMAL LOW (ref 7.350–7.450)

## 2016-03-01 LAB — BASIC METABOLIC PANEL
ANION GAP: 8 (ref 5–15)
Anion gap: 13 (ref 5–15)
BUN: 12 mg/dL (ref 6–20)
BUN: 13 mg/dL (ref 6–20)
CALCIUM: 8.1 mg/dL — AB (ref 8.9–10.3)
CHLORIDE: 103 mmol/L (ref 101–111)
CHLORIDE: 108 mmol/L (ref 101–111)
CO2: 22 mmol/L (ref 22–32)
CO2: 27 mmol/L (ref 22–32)
CREATININE: 1.34 mg/dL — AB (ref 0.61–1.24)
Calcium: 7.9 mg/dL — ABNORMAL LOW (ref 8.9–10.3)
Creatinine, Ser: 1.04 mg/dL (ref 0.61–1.24)
GFR calc Af Amer: >60 mL/min (ref >60)
GFR calc Af Amer: >60 mL/min (ref >60)
GFR calc non Af Amer: 59 mL/min — ABNORMAL LOW (ref >60)
GLUCOSE: 89 mg/dL (ref 65–99)
Glucose, Bld: 309 mg/dL — ABNORMAL HIGH (ref 65–99)
POTASSIUM: 4.2 mmol/L (ref 3.5–5.1)
Potassium: 3.3 mmol/L — ABNORMAL LOW (ref 3.5–5.1)
SODIUM: 138 mmol/L (ref 135–145)
SODIUM: 143 mmol/L (ref 135–145)

## 2016-03-01 LAB — CBC
HEMATOCRIT: 33.7 % — AB (ref 39.0–52.0)
HEMOGLOBIN: 11 g/dL — AB (ref 13.0–17.0)
MCH: 31.4 pg (ref 26.0–34.0)
MCHC: 32.6 g/dL (ref 30.0–36.0)
MCV: 96.3 fL (ref 78.0–100.0)
Platelets: 178 10*3/uL (ref 150–400)
RBC: 3.5 MIL/uL — ABNORMAL LOW (ref 4.22–5.81)
RDW: 12.9 % (ref 11.5–15.5)
WBC: 6.7 10*3/uL (ref 4.0–10.5)

## 2016-03-01 LAB — ECHOCARDIOGRAM COMPLETE
Height: 72 in
Weight: 3340.41 oz

## 2016-03-01 LAB — GLUCOSE, CAPILLARY
GLUCOSE-CAPILLARY: 76 mg/dL (ref 65–99)
Glucose-Capillary: 245 mg/dL — ABNORMAL HIGH (ref 65–99)

## 2016-03-01 LAB — MAGNESIUM: MAGNESIUM: 2.1 mg/dL (ref 1.7–2.4)

## 2016-03-01 LAB — VITAMIN B12: VITAMIN B 12: 605 pg/mL (ref 180–914)

## 2016-03-01 LAB — TSH: TSH: 1.34 u[IU]/mL (ref 0.350–4.500)

## 2016-03-01 LAB — AMMONIA: Ammonia: 22 umol/L (ref 9–35)

## 2016-03-01 LAB — HEPATITIS PANEL, ACUTE
HEP A IGM: NEGATIVE
HEP B S AG: NEGATIVE
Hep B C IgM: NEGATIVE

## 2016-03-01 LAB — TROPONIN I: Troponin I: <0.03 ng/mL (ref <0.031)

## 2016-03-01 LAB — TRIGLYCERIDES: Triglycerides: 45 mg/dL (ref <150)

## 2016-03-01 LAB — SEDIMENTATION RATE: Sed Rate: 2 mm/hr (ref 0–16)

## 2016-03-01 LAB — MRSA PCR SCREENING: MRSA by PCR: NEGATIVE

## 2016-03-01 LAB — LACTIC ACID, PLASMA
LACTIC ACID, VENOUS: 0.7 mmol/L (ref 0.5–2.0)
Lactic Acid, Venous: 5 mmol/L (ref 0.5–2.0)

## 2016-03-01 LAB — T4, FREE: FREE T4: 1.07 ng/dL (ref 0.61–1.12)

## 2016-03-01 LAB — C-REACTIVE PROTEIN: CRP: 0.5 mg/dL (ref <1.0)

## 2016-03-01 LAB — PHOSPHORUS: PHOSPHORUS: 7.1 mg/dL — AB (ref 2.5–4.6)

## 2016-03-01 MED ORDER — FENTANYL CITRATE (PF) 100 MCG/2ML IJ SOLN
INTRAMUSCULAR | Status: AC
  Filled 2016-03-01: qty 2

## 2016-03-01 MED ORDER — PROPOFOL 1000 MG/100ML IV EMUL: 0.0000 ug/kg/min | INTRAVENOUS | Status: DC

## 2016-03-01 MED ORDER — PROPOFOL 1000 MG/100ML IV EMUL
INTRAVENOUS | Status: AC
  Administered 2016-03-01: 30 ug/kg/min via INTRAVENOUS
  Filled 2016-03-01: qty 100

## 2016-03-01 MED ORDER — FENTANYL CITRATE (PF) 100 MCG/2ML IJ SOLN
INTRAMUSCULAR | Status: AC
  Filled 2016-03-01: qty 4

## 2016-03-01 MED ORDER — ANTISEPTIC ORAL RINSE SOLUTION (CORINZ)
7.0000 mL | Freq: Four times a day (QID) | OROMUCOSAL | Status: DC
  Administered 2016-03-01 (×2): 7 mL via OROMUCOSAL

## 2016-03-01 MED ORDER — SODIUM CHLORIDE 0.9 % IV BOLUS (SEPSIS)
1000.0000 mL | Freq: Once | INTRAVENOUS | Status: AC
  Administered 2016-03-01: 1000 mL via INTRAVENOUS

## 2016-03-01 MED ORDER — CHLORHEXIDINE GLUCONATE 0.12% ORAL RINSE (MEDLINE KIT)
15.0000 mL | Freq: Two times a day (BID) | OROMUCOSAL | Status: DC
  Administered 2016-03-01: 15 mL via OROMUCOSAL

## 2016-03-01 MED ORDER — FENTANYL CITRATE (PF) 100 MCG/2ML IJ SOLN
100.0000 ug | INTRAMUSCULAR | Status: DC | PRN
  Administered 2016-03-01: 100 ug via INTRAVENOUS

## 2016-03-01 MED ORDER — PANTOPRAZOLE SODIUM 40 MG PO TBEC
40.0000 mg | DELAYED_RELEASE_TABLET | Freq: Every day | ORAL | Status: DC
  Administered 2016-03-01 – 2016-03-04 (×4): 40 mg via ORAL
  Filled 2016-03-01 (×4): qty 1

## 2016-03-01 MED ORDER — FENTANYL CITRATE (PF) 100 MCG/2ML IJ SOLN
100.0000 ug | INTRAMUSCULAR | Status: DC | PRN
  Administered 2016-03-01: 100 ug via INTRAVENOUS
  Filled 2016-03-01: qty 2

## 2016-03-01 MED ORDER — MIDAZOLAM HCL 2 MG/2ML IJ SOLN
INTRAMUSCULAR | Status: AC
  Administered 2016-03-01: 02:00:00
  Filled 2016-03-01: qty 2

## 2016-03-01 MED ORDER — PROPOFOL 1000 MG/100ML IV EMUL
0.0000 ug/kg/min | INTRAVENOUS | Status: DC
  Administered 2016-03-01: 30 ug/kg/min via INTRAVENOUS

## 2016-03-01 MED ORDER — CHLORHEXIDINE GLUCONATE 0.12% ORAL RINSE (MEDLINE KIT)
15.0000 mL | Freq: Two times a day (BID) | OROMUCOSAL | Status: DC
Start: 2016-03-01 — End: 2016-03-01

## 2016-03-01 MED ORDER — MIDAZOLAM HCL 2 MG/2ML IJ SOLN
INTRAMUSCULAR | Status: AC
  Filled 2016-03-01: qty 4

## 2016-03-01 MED ORDER — LORAZEPAM 2 MG/ML IJ SOLN
INTRAMUSCULAR | Status: AC
  Filled 2016-03-01: qty 1

## 2016-03-01 MED ORDER — LEVETIRACETAM 750 MG PO TABS
1500.0000 mg | ORAL_TABLET | Freq: Two times a day (BID) | ORAL | Status: DC
  Administered 2016-03-01 – 2016-03-04 (×6): 1500 mg via ORAL
  Filled 2016-03-01: qty 6
  Filled 2016-03-01: qty 2
  Filled 2016-03-01 (×3): qty 6
  Filled 2016-03-01: qty 2

## 2016-03-01 MED ORDER — IOPAMIDOL (ISOVUE-370) INJECTION 76%
INTRAVENOUS | Status: AC
  Administered 2016-03-01: 50 mL
  Filled 2016-03-01: qty 50

## 2016-03-01 MED ORDER — PANTOPRAZOLE SODIUM 40 MG IV SOLR: 40.0000 mg | Freq: Every day | INTRAVENOUS | Status: DC

## 2016-03-01 MED FILL — Medication: Qty: 1 | Status: AC

## 2016-03-01 NOTE — Procedures (Signed)
Extubation Procedure Note  Patient Details:   Name: Nathan Ballard DOB: 1961-09-20 MRN: [id number]   Airway Documentation:  Airway 7 mm (Active)  Secured at (cm) 24 cm 03/01/2016  2:15 AM  Measured From Lips 03/01/2016  2:15 AM  Secured Location Right 03/01/2016  2:15 AM  Secured By [employer] 03/01/2016  2:15 AM  Site Condition Dry;Cool 03/01/2016  2:15 AM    Evaluation  O2 sats: stable throughout Complications: No apparent complications Patient did tolerate procedure well. Bilateral Breath Sounds: Clear   Yes   Pt extubated to a 2L [place] maintain sats throughout at 100%. Pt is stable at this time no complications noted. Pt able to state his name post extubation.   Leigh Aurora, BS, RRT, RCP 03/01/2016, 6:03 AM

## 2016-03-01 NOTE — Code Documentation (Addendum)
Called to see Mr. Sperrazza for decreased LOC.  On arrival to the room the pt was very pale, profusely diaphoretic, unresponsive to deep sternal rub, flaccid extremities, & dysconjugate gaze.  O2 sats 19% good pleth waveform, HR 65 & strong radial pulse. Requested nursing staff to pull Ativan for possible seizure while I began to bag pt. (Ativan was never administered). Code Blue called & cont. attempt to bag pt to increase O2 sats.  Pt was intubated by RT and pt's O2 sats at 98% prior to the arrival of the remainder of the code team. Updated Dr. Wendee Beavers & had him meet [place] in the ICU.  Pt was evaluated by Dr. Wendee Beavers & taken to CT scan.  En route to the CT scan the pt sat straight up & with purposeful movements disconnected himself from the ventilator.  He was restrained and versed given as well as placing a bite block.  The pt remained sedated through the CT scan but again woke up and was fighting as we returned to the ICU.  Dr. Wendee Beavers was present for the return trip to the ICU.

## 2016-03-01 NOTE — Code Documentation (Signed)
  Patient Name: AZIEL SCHAD   MRN: [id number]   Date of Birth/ Sex: [birth year] , male      Admission Date: 02/28/2016  Attending Provider: Reyne Dumas, MD  Primary Diagnosis: Seizure [facility])   Indication: Pt was in his usual state of health until this PM, when he was noted to be in respiratory arrest with pulse. Code blue was subsequently called. At the time of arrival on scene, ACLS protocol was underway.   Technical Description:  - CPR performance duration:  0  minutes  - Was defibrillation or cardioversion used? No   - Was external pacer placed? No  - Was patient intubated pre/post CPR? Yes   Medications Administered: Y = Yes; Blank = No Amiodarone    Atropine    Calcium    Epinephrine    Lidocaine    Magnesium    Norepinephrine    Phenylephrine    Sodium bicarbonate    Vasopressin     Post CPR evaluation:  - Final Status - Was patient successfully resuscitated ? Yes - What is current rhythm? Not on monitor, pulse present - What is current hemodynamic status? Stable but guarded  Miscellaneous Information:  - Labs sent, including: BMP, Mg, Phos, Troponin, Lactic acid, Keppra level  - Primary team notified?  Yes  - Family Notified? Yes  - Additional notes/ transfer status: Transfer to Neuro ICU     Zada Finders, MD  03/01/2016, 1:09 AM

## 2016-03-01 NOTE — Progress Notes (Signed)
Called to patients room by  Spouse  Noted patient clammy not responding to verbal or tactile stimuli Rapid response called to scene.

## 2016-03-01 NOTE — Progress Notes (Signed)
LTM set up; electrodes glued; all under 5 kohms. Dr Candiss Norse notified.

## 2016-03-01 NOTE — Progress Notes (Signed)
Interval History:                                                                                                                      Nathan Ballard is an 55 y.o. male patient with  with seizures, on LTM EEG monitoring.  He had an event last night associated with altered mental status, severe bradycardia and hypoxia. Has to be intubated and was on propofol. He is extubated this morning, he is at baseline now, with no new neurological symptoms.   Past Medical History: Past Medical History  Diagnosis Date  . Hypertension   . Hyperlipidemia   . Anxiety   . Arthritis     Past Surgical History  Procedure Laterality Date  . Anterior cruciate ligament repair  1989, 2007    left  . Shoulder surgery  1999    right  . Ganglion cyst excision  1998    right wrist  . Removal toe nail  1993    right great toe  . Knee arthroscopy with medial menisectomy Right 09/19/2015    Procedure: RIGHT KNEE ARTHROSCOPY WITH PARTIAL MEDIAL MENISCECTOMY;  Surgeon: Leandrew Koyanagi, MD;  Location: [facility];  Service: Orthopedics;  Laterality: Right;    Family History: Family History  Problem Relation Age of Onset  . Colon cancer Neg Hx   . Stomach cancer Neg Hx   . Diabetes Father   . Heart disease Father   . Hypertension Father   . Alzheimer's disease Mother     Social History:   reports that he has never smoked. He has never used smokeless tobacco. He reports that he does not drink alcohol or use illicit drugs.  Allergies:  Allergies  Allergen Reactions  . Morphine And Related Nausea And Vomiting     Medications:                                                                                                                         Current facility-administered medications:  .  0.9 %  sodium chloride infusion, , Intravenous, Continuous, Ivor Costa, MD, Last Rate: 75 mL/hr at 03/01/16 0732, 75 mL at 03/01/16 0732 .  ALPRAZolam (XANAX) tablet 0.25 mg, 0.25 mg, Oral, BID PRN,  Ivor Costa, MD .  amphetamine-dextroamphetamine (ADDERALL XR) 24 hr capsule 20 mg, 20 mg, Oral, Daily, Ivor Costa, MD, 20 mg at 03/01/16 1034 .  antiseptic oral rinse solution (CORINZ), 7 mL, Mouth Rinse, QID, Guadelupe Sabin  Deterding, MD, 7 mL at 03/01/16 1711 .  atorvastatin (LIPITOR) tablet 20 mg, 20 mg, Oral, Daily, Ivor Costa, MD, 20 mg at 03/01/16 1034 .  chlorhexidine gluconate (SAGE KIT) (PERIDEX) 0.12 % solution 15 mL, 15 mL, Mouth Rinse, BID, Colbert Coyer, MD, 15 mL at 03/01/16 0339 .  DULoxetine (CYMBALTA) DR capsule 30 mg, 30 mg, Oral, BID, Ivor Costa, MD, 30 mg at 03/01/16 2110 .  enoxaparin (LOVENOX) injection 40 mg, 40 mg, Subcutaneous, Q24H, Ivor Costa, MD, 40 mg at 03/01/16 1249 .  fentaNYL (SUBLIMAZE) injection 100 mcg, 100 mcg, Intravenous, Q15 min PRN, Colbert Coyer, MD, 100 mcg at 03/01/16 0205 .  fentaNYL (SUBLIMAZE) injection 100 mcg, 100 mcg, Intravenous, Q2H PRN, Colbert Coyer, MD, 100 mcg at 03/01/16 0510 .  levETIRAcetam (KEPPRA) tablet 1,500 mg, 1,500 mg, Oral, BID, Ceairra Mccarver Fuller Mandril, MD, 1,500 mg at 03/01/16 2110 .  LORazepam (ATIVAN) injection 1 mg, 1 mg, Intravenous, PRN, Ivor Costa, MD .  multivitamin with minerals tablet 1 tablet, 1 tablet, Oral, Daily, Ivor Costa, MD, 1 tablet at 03/01/16 1035 .  nebivolol (BYSTOLIC) tablet 10 mg, 10 mg, Oral, Daily, Ivor Costa, MD, 10 mg at 03/01/16 1035 .  ondansetron (ZOFRAN) tablet 4 mg, 4 mg, Oral, Q6H PRN **OR** ondansetron (ZOFRAN) injection 4 mg, 4 mg, Intravenous, Q6H PRN, Ivor Costa, MD .  pantoprazole (PROTONIX) EC tablet 40 mg, 40 mg, Oral, Daily, Brand Males, MD, 40 mg at 03/01/16 1711 .  propofol (DIPRIVAN) 1000 MG/100ML infusion, 0-50 mcg/kg/min, Intravenous, Continuous, Colbert Coyer, MD, Stopped at 03/01/16 0540 .  sodium chloride flush (NS) 0.9 % injection 3 mL, 3 mL, Intravenous, Q12H, Ivor Costa, MD, 3 mL at 02/29/16 0404   Neurologic Examination:                                                                                                      Today's Vitals   03/01/16 1700 03/01/16 1800 03/01/16 1900 03/01/16 1908  BP: 113/67  126/71   Pulse: 62 57 64   Temp:    98.5 F (36.9 C)  TempSrc:    Oral  Resp: 15 20 19    Height:      Weight:      SpO2: 98% 96% 98%   PainSc:       Evaluation of higher integrative functions including: Level of alertness: Alert,  Oriented to time, place and person Speech: fluent, no evidence of dysarthria or aphasia noted.  Test the following cranial nerves: 2-12 grossly intact Motor examination: Normal tone, bulk, full 5/5 motor strength in all 4 extremities Test coordination: Normal finger nose testing, with no evidence of limb appendicular ataxia or abnormal involuntary movements or tremors noted.   Lab Results: Basic Metabolic Panel:  Recent Labs Lab 02/28/16 2348 02/29/16 0109 03/01/16 0055 03/01/16 0923  NA 142 141 138 143  K 4.7 3.8 3.3* 4.2  CL 105 105 103 108  CO2 27 24 22 27   GLUCOSE 138* 91 309* 89  BUN 17 16 13 12   CREATININE 1.11 0.96 1.34*  1.04  CALCIUM 8.8* 8.7* 8.1* 7.9*  MG 2.1  --  2.1  --   PHOS  --   --  7.1*  --     Liver Function Tests:  Recent Labs Lab 02/28/16 2348 02/29/16 0109  AST 93* 83*  ALT 104* 99*  ALKPHOS 50 49  BILITOT 0.5 0.7  PROT 5.9* 6.0*  ALBUMIN 3.8 3.9   No results for input(s): LIPASE, AMYLASE in the last 168 hours.  Recent Labs Lab 03/01/16 1833  AMMONIA 22    CBC:  Recent Labs Lab 02/28/16 2348 02/29/16 0109 03/01/16 0923  WBC 12.8* 11.1* 6.7  NEUTROABS 10.6*  --   --   HGB 12.6* 12.3* 11.0*  HCT 37.8* 37.0* 33.7*  MCV 95.2 94.4 96.3  PLT 214 212 178    Cardiac Enzymes:  Recent Labs Lab 03/01/16 0055  TROPONINI <0.03    Lipid Panel:  Recent Labs Lab 02/29/16 0129 03/01/16 0347  CHOL 121  --   TRIG 34 45  HDL 50  --   CHOLHDL 2.4  --   VLDL 7  --   LDLCALC 64  --     CBG:  Recent Labs Lab 02/29/16 0759  03/01/16 0045 03/01/16 0819  GLUCAP 73 245* 16    Microbiology: Results for orders placed or performed during the hospital encounter of 02/28/16  MRSA PCR Screening     Status: None   Collection Time: 03/01/16  1:20 AM  Result Value Ref Range Status   MRSA by PCR NEGATIVE NEGATIVE Final    Comment:        The GeneXpert MRSA Assay (FDA approved for NASAL specimens only), is one component of a comprehensive MRSA colonization surveillance program. It is not intended to diagnose MRSA infection nor to guide or monitor treatment for MRSA infections.     Imaging: Ct Angio Head W/cm &/or Wo Cm  03/01/2016  CLINICAL DATA:  55 year old male with possible overdose, found unresponsive. Syncope, seizure like activity. Initial encounter. EXAM: CT ANGIOGRAPHY HEAD AND NECK TECHNIQUE: Multidetector CT imaging of the head and neck was performed using the standard protocol during bolus administration of intravenous contrast. Multiplanar CT image reconstructions and MIPs were obtained to evaluate the vascular anatomy. Carotid stenosis measurements (when applicable) are obtained utilizing NASCET criteria, using the distal internal carotid diameter as the denominator. CONTRAST:  50 mL Isovue 370. COMPARISON:  Head CT without contrast 0145 hours today. Brain MRI 02/29/2016, and earlier. FINDINGS: CTA NECK Skeleton:  No acute osseous abnormality identified. Right paranasal sinus opacification and bubbly opacity. Mastoids are clear. Other neck: Negative visualized lung parenchyma aside from dependent opacity probably reflecting atelectasis. No superior mediastinal lymphadenopathy. Negative thyroid, larynx, pharynx, parapharyngeal spaces, retropharyngeal space, sublingual space (other than left external carotid vascular findings described below), submandibular glands, and parotid glands. No cervical lymphadenopathy. Aortic arch: 3 vessel arch configuration. No arch atherosclerosis. No great vessel origin stenosis.  Right carotid system: Minimal soft and calcified plaque at the posterior right ICA origin. No stenosis. Tortuous right ICA just below the skullbase. Left carotid system: Mild mostly calcified plaque at the left ICA origin. No stenosis. Mildly tortuous left ICA. There is a prominent branch of the left ECA (Takeoff series 501, image 99) which tracks into the left sublingual space and oral tongue (see series 506, images 25 and 26) and is asymmetrically enlarged suggesting a mild vascular malformation. Vertebral arteries:No proximal subclavian artery stenosis, despite some calcified plaque on the right. Normal right vertebral  artery origin. Mildly tortuous but otherwise negative right vertebral artery to the skullbase. Normal left vertebral artery origin. Tortuous but otherwise negative left vertebral artery to the skullbase. CTA HEAD Posterior circulation: Codominant distal vertebral arteries. Tortuous but otherwise normal vertebrobasilar junction. PICA origins are patent. No basilar stenosis. Normal SCA and PCA origins. Left posterior communicating artery is present, the right is diminutive or absent. Bilateral PCA branches are within normal limits. Anterior circulation: Both ICA siphons are patent with mild dolichoectasia. No siphon plaque or stenosis. Normal left posterior communicating artery origin. Patent carotid termini. Normal MCA and ACA origins. Diminutive anterior communicating artery. Bilateral ACA branches are normal aside from mild tortuosity. Left MCA M1 segment is mildly tortuous, but otherwise the M1, bifurcation, and left MCA branches are within normal limits. Right MCA M1 segment, bifurcation, and right MCA branches are within normal limits. Venous sinuses: Patent. Large developmental venous anomaly incidentally noted in the right superior frontal gyrus (normal variant). Anatomic variants: None. Delayed phase: No abnormal enhancement identified. Gray-white matter differentiation remains normal.  IMPRESSION: 1. Minimal to mild cervical carotid atherosclerosis with no stenosis. Tortuous bilateral carotid arteries and ICA siphons. 2. 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 Recommend outpatient Neuro-endovascular follow-up. 3. Otherwise negative CTA head and neck. 4. Stable and negative CT appearance of the brain. Right frontal lobe DVA (normal variant). Electronically Signed   By: Genevie Ann M.D.   On: 03/01/2016 11:02   Ct Head Wo Contrast  03/01/2016  CLINICAL DATA:  Seizure activity.  Low blood pressure. EXAM: CT HEAD WITHOUT CONTRAST TECHNIQUE: Contiguous axial images were obtained from the base of the skull through the vertex without intravenous contrast. COMPARISON:  MRI brain 02/29/2016.  CT head 02/28/2016. FINDINGS: Ventricles and sulci appear symmetrical. No ventricular dilatation. Vague focal area of increased attenuation in the right posterior frontal lobe with suggestion of a draining vein consistent with venous malformation. This finding was also demonstrated on previous MRI. No mass effect or midline shift. No abnormal extra-axial fluid collections. Gray-white matter junctions are distinct. Basal cisterns are not effaced. No evidence of acute intracranial hemorrhage. No depressed skull fractures. Mucosal thickening in the paranasal sinuses. Inspissated mucus in the right maxillary antrum. Mastoid air cells are not opacified. IMPRESSION: No acute intracranial abnormalities. 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 Electronically Signed   By: Lucienne Capers M.D.   On: 03/01/2016 02:35   Ct Head Wo Contrast  02/29/2016  CLINICAL DATA:  Syncope, possible apnea. 3-4 possible seizures. History of hypertension hyperlipidemia. EXAM: CT HEAD WITHOUT CONTRAST TECHNIQUE: Contiguous axial images were obtained from the base of the skull through the vertex without intravenous contrast. COMPARISON:  MRI of the brain  November 07, 2007 FINDINGS: INTRACRANIAL CONTENTS: The ventricles and sulci are normal. No intraparenchymal hemorrhage, mass effect nor midline shift. No acute large vascular territory infarcts. RIGHT frontal developmental venous anomaly better demonstrated on prior contrast-enhanced MRI. No abnormal extra-axial fluid collections. Basal cisterns are patent. ORBITS: The included ocular globes and orbital contents are normal. SINUSES: RIGHT maxillary sinus mucosal thickening with air-fluid level. Mild fronto ethmoidal mucosal thickening. Subcentimeter RIGHT frontal osteoma. Mastoid air cells are well aerated. SKULL/SOFT TISSUES: No skull fracture. No significant soft tissue swelling. IMPRESSION: Negative CT head. Mild acute paranasal sinusitis. Electronically Signed   By: Elon Alas M.D.   On: 02/29/2016 00:02   Ct Angio Neck W/cm &/or Wo/cm  03/01/2016  CLINICAL DATA:  55 year old male with possible overdose, found unresponsive. Syncope, seizure like activity. Initial encounter. EXAM: CT ANGIOGRAPHY HEAD AND NECK TECHNIQUE: Multidetector CT imaging of the head and neck was performed using the standard protocol during bolus administration of intravenous contrast. Multiplanar CT image reconstructions and MIPs were obtained to evaluate the vascular anatomy. Carotid stenosis measurements (when applicable) are obtained utilizing NASCET criteria, using the distal internal carotid diameter as the denominator. CONTRAST:  50 mL Isovue 370 COMPARISON:  Head CT without contrast 0145 hours today. Brain MRI 02/29/2016, and earlier. FINDINGS: CTA NECK Skeleton: No acute osseous abnormality identified. Right paranasal sinus opacification and bubbly opacity. Mastoids are clear. Other neck: Negative visualized lung parenchyma aside from dependent opacity probably reflecting atelectasis. No superior mediastinal lymphadenopathy. Negative thyroid, larynx, pharynx, parapharyngeal spaces, retropharyngeal space, sublingual space  (other than left external carotid vascular findings described below), submandibular glands, and parotid glands. No cervical lymphadenopathy. Aortic arch: 3 vessel arch configuration. No arch atherosclerosis. No great vessel origin stenosis. Right carotid system: Minimal soft and calcified plaque at the posterior right ICA origin. No stenosis. Tortuous right ICA just below the skullbase. Left carotid system: Mild mostly calcified plaque at the left ICA origin. No stenosis. Mildly tortuous left ICA. There is a prominent branch of the left ECA (Takeoff series 501, image 99) which tracks into the left sublingual space and oral tongue (see series 506, images 25 and 26) and is asymmetrically enlarged suggesting a mild vascular malformation. Vertebral arteries:No proximal subclavian artery stenosis, despite some calcified plaque on the right. Normal right vertebral artery origin. Mildly tortuous but otherwise negative right vertebral artery to the skullbase. Normal left vertebral artery origin. Tortuous but otherwise negative left vertebral artery to the skullbase. CTA HEAD Posterior circulation: Codominant distal vertebral arteries. Tortuous but otherwise normal vertebrobasilar junction. PICA origins are patent. No basilar stenosis. Normal SCA and PCA origins. Left posterior communicating artery is present, the right is diminutive or absent. Bilateral PCA branches are within normal limits. Anterior circulation: Both ICA siphons are patent with mild dolichoectasia. No siphon plaque or stenosis. Normal left posterior communicating artery origin. Patent carotid termini. Normal MCA and ACA origins. Diminutive anterior communicating artery. Bilateral ACA branches are normal aside from mild tortuosity. Left MCA M1 segment is mildly tortuous, but otherwise the M1, bifurcation, and left MCA branches are within normal limits. Right MCA M1 segment, bifurcation, and right MCA branches are within normal limits. Venous sinuses:  Patent. Large developmental venous anomaly incidentally noted in the right superior frontal gyrus (normal variant). Anatomic variants: None. Delayed phase: No abnormal enhancement identified. Gray-white matter differentiation remains normal. IMPRESSION: 1. Minimal to mild cervical carotid atherosclerosis with no stenosis. Tortuous bilateral carotid arteries and ICA siphons. 2. 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 Recommend outpatient Neuro-endovascular follow-up. 3. Otherwise negative CTA head and neck. 4. Stable and negative CT appearance of the brain. Right frontal lobe DVA (normal variant). Electronically Signed   By: Genevie Ann M.D.   On: 03/01/2016 11:09   Mr Jeri Cos IF Contrast  02/29/2016  CLINICAL DATA:  Possible overdose yesterday evening, found unresponsive on floor. Evaluate seizure like activity. EXAM: MRI HEAD WITHOUT AND WITH CONTRAST TECHNIQUE: Multiplanar, multiecho pulse sequences of the brain and surrounding structures were obtained without and with intravenous contrast. CONTRAST:  15 cc MultiHance COMPARISON:  CT head February 28, 2016 and MRI of the brain November 07, 2007 FINDINGS: The ventricles and sulci are  normal for patient's age. No abnormal parenchymal signal, mass lesions, mass effect. No abnormal parenchymal enhancement. Re- demonstration of RIGHT frontal developmental venous anomaly. No susceptibility artifact is suggests associated cavernoma. A few punctate supratentorial white matter FLAIR T2 hyperintensities are less than expected for age, most compatible with chronic small vessel ischemic disease. No reduced diffusion to suggest acute ischemia. Hippocampi demonstrate symmetric size, morphology and signal. No abnormal extra-axial fluid collections. No extra-axial masses nor leptomeningeal enhancement. Normal major intracranial vascular flow voids seen at the skull base. Ocular globes and orbital contents are  unremarkable though not tailored for evaluation. No suspicious calvarial bone marrow signal. No abnormal sellar expansion. Craniocervical junction maintained. RIGHT maxillary sinus air-fluid level. Mild paranasal sinus mucosal thickening. Mastoid air cells are well aerated. IMPRESSION: No acute intracranial process. Stable RIGHT frontal developmental venous anomaly ; otherwise negative MRI of the brain with contrast for age. Electronically Signed   By: Elon Alas M.D.   On: 02/29/2016 02:56   US Abdomen Complete  02/28/2016  CLINICAL DATA:  Abnormal liver function studies, history of hypertension. EXAM: ABDOMEN ULTRASOUND COMPLETE COMPARISON:  None. FINDINGS: Gallbladder: No gallstones or wall thickening visualized. No sonographic Murphy sign noted by sonographer. Common bile duct: Diameter: 3.2 mm Liver: The hepatic echotexture is normal. There is no focal mass or ductal dilation. The surface contour of the liver is normal. IVC: No abnormality visualized. Pancreas: Evaluation of the pancreas was not possible due to excessive bowel gas. Spleen: Size and appearance within normal limits. Right Kidney: Length: 11.8 cm. Echogenicity within normal limits. No mass or hydronephrosis visualized. Left Kidney: Length: 12.4 cm. Echogenicity within normal limits. No mass or hydronephrosis visualized. Abdominal aorta: There is no abdominal aortic aneurysm. Other findings: There is no ascites. IMPRESSION: 1. Normal abdominal ultrasound examination. No gallstones. If there are clinical concerns of gallbladder dysfunction, a nuclear medicine hepatobiliary scan may be useful. 2. Nonvisualization of the pancreas due to bowel gas. Electronically Signed   By: David  Martinique M.D.   On: 02/28/2016 10:12   Dg Chest Port 1 View  03/01/2016  CLINICAL DATA:  Emergent intubation. EXAM: PORTABLE CHEST 1 VIEW COMPARISON:  09/12/2015 FINDINGS: Endotracheal tube placed with tip measuring 5.3 cm above the carina. Shallow inspiration  with atelectasis in the lung bases. Possible focal consolidation in the left lung base behind the heart. No pneumothorax. No blunting of costophrenic angles. IMPRESSION: Endotracheal tube with tip measuring 5.3 cm above the carina. Shallow inspiration with atelectasis in the lung bases and possible consolidation in the left lung base. Electronically Signed   By: Lucienne Capers M.D.   On: 03/01/2016 01:51    Assessment and plan:   MARCIA LEPERA is an 55 y.o. male patient with seizures, on LTM EEG monitoring. Keppra dose increased to 1500 mg twice daily.  With regard to the external carotid AVM, discussed with Dr. Estanislado Pandy for further evaluation with an elective diagnostic angiogram.   We'll follow-up

## 2016-03-01 NOTE — Progress Notes (Signed)
abg collected  

## 2016-03-01 NOTE — Consult Note (Signed)
PULMONARY / CRITICAL CARE MEDICINE   Name: Nathan Ballard MRN: [id number] DOB: Jul 15, 1961    ADMISSION DATE:  02/28/2016 CONSULTATION DATE:  03/01/2016  REFERRING MD :  Dr. Blaine Hamper  CHIEF COMPLAINT:  [place]  INITIAL PRESENTATION:  55 yo male with h/o HTN, HLD, depression anxiety who had had 3 episodes of of shaking and LOC per wife over the last month.  He was admitted after he was found down by son and got CPR.  EMS gave narcan with resolution of symptoms however, UDS negative in the ED.  SIGNIFICANT EVENTS: Today became unresponsive on the floor with O2 sat 19%, and gurgling noise, no lost pulse (See Rapid response note for full details).  He was intubated for airway protection, no CPR was performed, he awoke confused, became hypotensive and was treated with fluid resuscitation, BG at that time was normal.  He was transferred to the ICU under the CCM service.     PAST MEDICAL HISTORY :   has a past medical history of Hypertension; Hyperlipidemia; Anxiety; and Arthritis.  has past surgical history that includes Anterior cruciate ligament repair (1989, 2007); Shoulder surgery (1999); Ganglion cyst excision (1998); removal toe nail (1993); and Knee arthroscopy with medial menisectomy (Right, 09/19/2015). Prior to Admission medications   Medication Sig Start Date End Date Taking? Authorizing Provider  ALPRAZolam (XANAX) 0.25 MG tablet Take 0.25 mg by mouth 2 (two) times daily as needed for anxiety.   Yes Historical Provider, MD  amphetamine-dextroamphetamine (ADDERALL XR) 20 MG 24 hr capsule Take 20 mg by mouth daily. 01/30/16  Yes Historical Provider, MD  atorvastatin (LIPITOR) 40 MG tablet Take 20 mg by mouth daily.  08/18/12  Yes Historical Provider, MD  DULoxetine (CYMBALTA) 30 MG capsule Take 30 mg by mouth 2 (two) times daily. 02/22/16  Yes Historical Provider, MD  Multiple Vitamin (MULTIVITAMIN) tablet Take 1 tablet by mouth daily.   Yes Historical Provider, MD  nebivolol (BYSTOLIC) 10  MG tablet Take 10 mg by mouth daily.   Yes Historical Provider, MD  HYDROcodone-acetaminophen (NORCO) 7.5-325 MG tablet Take 1-2 tablets by mouth every 6 (six) hours as needed for moderate pain. Patient not taking: Reported on 01/26/2016 09/19/15   Leandrew Koyanagi, MD   Allergies  Allergen Reactions  . Morphine And Related Nausea And Vomiting    FAMILY HISTORY:  indicated that his father is deceased.  SOCIAL HISTORY:  reports that he has never smoked. He has never used smokeless tobacco. He reports that he does not drink alcohol or use illicit drugs.  REVIEW OF SYSTEMS:   Per wife: Denies: n/v/d, chills, fevers, sweating, HA, heat/cold intolerance. No change in sensation or weakness. No visual/auditory hallucinations.  Reports: Confusion, shaking prior to episodes  SUBJECTIVE:   VITAL SIGNS: Temp:  [97.5 F (36.4 C)-98.9 F (37.2 C)] 97.5 F (36.4 C) (04/22 0225) Pulse Rate:  [57-99] 66 (04/22 0606) Resp:  [11-24] 14 (04/22 0606) BP: (51-131)/(35-77) 117/73 mmHg (04/22 0606) SpO2:  [74 %-100 %] 100 % (04/22 0606) FiO2 (%):  [40 %] 40 % (04/22 0215) Weight:  [94.7 kg (208 lb 12.4 oz)] 94.7 kg (208 lb 12.4 oz) (04/22 0100) HEMODYNAMICS:   VENTILATOR SETTINGS: Vent Mode:  [-] PRVC FiO2 (%):  [40 %] 40 % Set Rate:  [24 bmp] 24 bmp Vt Set:  [580 mL] 580 mL PEEP:  [5 cmH20] 5 cmH20 Plateau Pressure:  [16 cmH20] 16 cmH20 INTAKE / OUTPUT:  Intake/Output Summary (Last 24 hours) at 03/01/16 0630 Last  data filed at 03/01/16 0600  Gross per 24 hour  Intake  43.05 ml  Output    475 ml  Net -431.95 ml    PHYSICAL EXAMINATION: General:  NAD intubated Neuro:  CN-II-XII intact, moves all extremities HEENT:  NCAT, ETT in place Cardiovascular:  RRR, S1/s2 no m/r/g Lungs:  Mechanical breath sounds bilaterally. No w/r/r Abdomen:  Soft, non tender, normal bowel sounds Musculoskeletal:  Normal bulk and tone Skin:  No c/c/e  LABS:  CBC  Recent Labs Lab 02/28/16 2348  02/29/16 0109  WBC 12.8* 11.1*  HGB 12.6* 12.3*  HCT 37.8* 37.0*  PLT 214 212   Coag's  Recent Labs Lab 02/29/16 0108  APTT 25  INR 1.10   BMET  Recent Labs Lab 02/28/16 2348 02/29/16 0109 03/01/16 0055  NA 142 141 138  K 4.7 3.8 3.3*  CL 105 105 103  CO2 27 24 22   BUN 17 16 13   CREATININE 1.11 0.96 1.34*  GLUCOSE 138* 91 309*   Electrolytes  Recent Labs Lab 02/28/16 2348 02/29/16 0109 03/01/16 0055  CALCIUM 8.8* 8.7* 8.1*  MG 2.1  --  2.1  PHOS  --   --  7.1*   Sepsis Markers  Recent Labs Lab 03/01/16 0055  LATICACIDVEN 5.0*   ABG  Recent Labs Lab 03/01/16 0121 03/01/16 0233  PHART 7.214* 7.381  PCO2ART 65.1* 38.5  PO2ART 363.0* 142.0*   Liver Enzymes  Recent Labs Lab 02/28/16 2348 02/29/16 0109  AST 93* 83*  ALT 104* 99*  ALKPHOS 50 49  BILITOT 0.5 0.7  ALBUMIN 3.8 3.9   Cardiac Enzymes  Recent Labs Lab 03/01/16 0055  TROPONINI <0.03   Glucose  Recent Labs Lab 02/29/16 0759 03/01/16 0045  GLUCAP 73 245*    Imaging Ct Head Wo Contrast  03/01/2016  CLINICAL DATA:  Seizure activity.  Low blood pressure. EXAM: CT HEAD WITHOUT CONTRAST TECHNIQUE: Contiguous axial images were obtained from the base of the skull through the vertex without intravenous contrast. COMPARISON:  MRI brain 02/29/2016.  CT head 02/28/2016. FINDINGS: Ventricles and sulci appear symmetrical. No ventricular dilatation. Vague focal area of increased attenuation in the right posterior frontal lobe with suggestion of a draining vein consistent with venous malformation. This finding was also demonstrated on previous MRI. No mass effect or midline shift. No abnormal extra-axial fluid collections. Gray-white matter junctions are distinct. Basal cisterns are not effaced. No evidence of acute intracranial hemorrhage. No depressed skull fractures. Mucosal thickening in the paranasal sinuses. Inspissated mucus in the right maxillary antrum. Mastoid air cells are not  opacified. IMPRESSION: No acute intracranial abnormalities. 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 Electronically Signed   By: Lucienne Capers M.D.   On: 03/01/2016 02:35   Dg Chest Port 1 View  03/01/2016  CLINICAL DATA:  Emergent intubation. EXAM: PORTABLE CHEST 1 VIEW COMPARISON:  09/12/2015 FINDINGS: Endotracheal tube placed with tip measuring 5.3 cm above the carina. Shallow inspiration with atelectasis in the lung bases. Possible focal consolidation in the left lung base behind the heart. No pneumothorax. No blunting of costophrenic angles. IMPRESSION: Endotracheal tube with tip measuring 5.3 cm above the carina. Shallow inspiration with atelectasis in the lung bases and possible consolidation in the left lung base. Electronically Signed   By: Lucienne Capers M.D.   On: 03/01/2016 01:51     ASSESSMENT / PLAN: 55 yo male with LOC today concerning for seizure given hypoxemia, post-ictal state and elevated lactate.   PULMONARY A:  Acute hypoxic and hypercapnic respiratory failure Intubated for above and airway protection P:   - CPAP/PS trial, volumes >1L on 5/5, Mental status appropriate, follows all commands - Extubated  CARDIOVASCULAR  A:  Peri-intubation hypotension - resloved P:  - resolved with fluid administration  RENAL A:   Hypertension P:   Cont home meds  GASTROINTESTINAL A:  Not acitve P:     HEMATOLOGIC A:   Not acitve P:    INFECTIOUS A:   Not active P:    ENDOCRINE A:   HLD   P:   Continue home meds - maintain BG >180  NEUROLOGIC A:   Seizure with post-ictal state P:   RASS goal: 0 - Keppra 500bid - PO ativivan PRN  - plan per neurology consult - CT negative   FAMILY: Wife at bedside, updated by me.  Total critical care time: 45 min  Critical care time was exclusive of separately billable procedures and treating other patients.  Critical care was necessary to treat or prevent imminent or life-threatening  deterioration.  Critical care was time spent personally by me on the following activities: development of treatment plan with patient and/or surrogate as well as nursing, discussions with consultants, evaluation of patient's response to treatment, examination of patient, obtaining history from patient or surrogate, ordering and performing treatments and interventions, ordering and review of laboratory studies, ordering and review of radiographic studies, pulse oximetry and re-evaluation of patient's condition.   Meribeth Mattes, DO., MS [facility]     Pulmonary and [place] Pager: [phone]  03/01/2016, 6:30 AM

## 2016-03-01 NOTE — Progress Notes (Signed)
[facility] Progress Note Patient Name: Nathan Ballard DOB: 12/25/1960 MRN: [id number]   Date of Service  03/01/2016  HPI/Events of Note  Hypotension on 15 mcg propofol with BP of 51/35 (42).  eICU Interventions  1 liter of NS IV for BP support     Intervention Category Intermediate Interventions: Hypotension - evaluation and management  Tranae Laramie 03/01/2016, 2:34 AM

## 2016-03-01 NOTE — Consult Note (Addendum)
Called to evaluate for loss of mental status.  Patient had a respiratory arrest and needed intubation. Never lost his pulse.  No sedation but has recovered mental status.  Blood pressure dropped to the [id number] systolic. Patient transferred to the ICU and critical care consulted. 1L bolus fluid ordered.  There was some blood over his lip and pt smelled of urine suggesting he might have seized.  Shortly after 1L bolus patient woke up and became combative - given 2mg  versed.  Then CT head done and negative.  Blood pressure increased to 0000000 systolic and pt then wide awake and following commands.  After discussion with ICU attending decided to keep him sedated and intubated overnight.  At this point this patient is going to require ICU monitoring and cEEG monitoring starting tomorrow.

## 2016-03-01 NOTE — Progress Notes (Signed)
*  PRELIMINARY RESULTS* Echocardiogram 2D Echocardiogram has been performed.  Nathan Ballard 03/01/2016, 3:50 PM

## 2016-03-01 NOTE — Progress Notes (Addendum)
Seen earlier by ccm MD  Eye ball bedside exam He is on c-eeg and talking Looks stable  PULMONARY  Recent Labs Lab 03/01/16 0121 03/01/16 0233 03/01/16 0540  PHART 7.214* 7.381  --   PCO2ART 65.1* 38.5  --   PO2ART 363.0* 142.0*  --   HCO3 26.3* 23.0 24.4*  TCO2 28 24 25.4  O2SAT 100.0 99.0 74.7    CBC  Recent Labs Lab 02/28/16 2348 02/29/16 0109 03/01/16 0923  HGB 12.6* 12.3* 11.0*  HCT 37.8* 37.0* 33.7*  WBC 12.8* 11.1* 6.7  PLT 214 212 178    COAGULATION  Recent Labs Lab 02/29/16 0108  INR 1.10    CARDIAC   Recent Labs Lab 03/01/16 0055  TROPONINI <0.03   No results for input(s): PROBNP in the last 168 hours.   CHEMISTRY  Recent Labs Lab 02/28/16 2348 02/29/16 0109 03/01/16 0055 03/01/16 0923  NA 142 141 138 143  K 4.7 3.8 3.3* 4.2  CL 105 105 103 108  CO2 27 24 22 27   GLUCOSE 138* 91 309* 89  BUN 17 16 13 12   CREATININE 1.11 0.96 1.34* 1.04  CALCIUM 8.8* 8.7* 8.1* 7.9*  MG 2.1  --  2.1  --   PHOS  --   --  7.1*  --    Estimated Creatinine Clearance: 96.9 mL/min (by C-G formula based on Cr of 1.04).   LIVER  Recent Labs Lab 02/28/16 2348 02/29/16 0108 02/29/16 0109  AST 93*  --  83*  ALT 104*  --  99*  ALKPHOS 50  --  49  BILITOT 0.5  --  0.7  PROT 5.9*  --  6.0*  ALBUMIN 3.8  --  3.9  INR  --  1.10  --      INFECTIOUS  Recent Labs Lab 03/01/16 0055 03/01/16 0923  LATICACIDVEN 5.0* 0.7     ENDOCRINE CBG (last 3)   Recent Labs  02/29/16 0759 03/01/16 0045 03/01/16 0819  GLUCAP 73 245* 24        Plan - eMD routine care from now on through AM  Dr. Brand Males, M.D., [facility].C.P Pulmonary and Critical Care Medicine Staff Physician [facility] Pager: [phone], If no answer or between  15:00h - 7:00h: call [phone]  03/01/2016 2:02 PM

## 2016-03-01 NOTE — Procedures (Signed)
Intubation Procedure Note Nathan Ballard [id number] 1961/08/23  Procedure: Intubation Indications: Respiratory insufficiency  Procedure Details Consent: Unable to obtain consent because of altered level of consciousness. Time Out: Verified patient identification, verified procedure, site/side was marked, verified correct patient position, special equipment/implants available, medications/allergies/relevent history reviewed, required imaging and test results available.  Performed  Maximum sterile technique was used including gloves and hand hygiene.  MAC and 3    Evaluation Hemodynamic Status: BP stable throughout; O2 sats: stable throughout Patient's Current Condition: stable Complications: No apparent complications Patient did tolerate procedure well. Chest X-ray ordered to verify placement.  CXR: tube position acceptable.  Code Blue initiated. On arrival, found patient marked diaphoretic and respiratory insufficieny. Pt was unresponsive to commands or verbal stimuli and patient may have possibly seized. O2 sats was 19% on arrival. Pt was bag masked manually ventilated with 100% FiO2. O2 sats came up to the 60's and wouldnt increase no further. Pt was intubated per RT and sats immediately came up once bagged through ETT. Pt had positive color change on ETCO2 detector and bilaterally breath sounds confirmed. No distress or complications noted throughout intubation.   Leigh Aurora, BS, RRT, RCP 03/01/2016

## 2016-03-02 ENCOUNTER — Encounter (HOSPITAL_COMMUNITY): Payer: Self-pay | Admitting: General Surgery

## 2016-03-02 DIAGNOSIS — Q280 Arteriovenous malformation of precerebral vessels: Secondary | ICD-10-CM

## 2016-03-02 DIAGNOSIS — R930 Abnormal findings on diagnostic imaging of skull and head, not elsewhere classified: Secondary | ICD-10-CM

## 2016-03-02 DIAGNOSIS — J9601 Acute respiratory failure with hypoxia: Secondary | ICD-10-CM

## 2016-03-02 DIAGNOSIS — R569 Unspecified convulsions: Principal | ICD-10-CM

## 2016-03-02 HISTORY — DX: Acute respiratory failure with hypoxia: J96.01

## 2016-03-02 HISTORY — DX: Hypomagnesemia: E83.42

## 2016-03-02 LAB — LEVETIRACETAM LEVEL: LEVETIRACETAM: 17.7 ug/mL (ref 10.0–40.0)

## 2016-03-02 LAB — PHOSPHORUS: Phosphorus: 3.4 mg/dL (ref 2.5–4.6)

## 2016-03-02 LAB — LACTIC ACID, PLASMA: Lactic Acid, Venous: 1 mmol/L (ref 0.5–2.0)

## 2016-03-02 LAB — T3, FREE: T3 FREE: 2.7 pg/mL (ref 2.0–4.4)

## 2016-03-02 LAB — MAGNESIUM: MAGNESIUM: 1.8 mg/dL (ref 1.7–2.4)

## 2016-03-02 LAB — GLUCOSE, CAPILLARY: GLUCOSE-CAPILLARY: 80 mg/dL (ref 65–99)

## 2016-03-02 MED ORDER — MAGNESIUM SULFATE 2 GM/50ML IV SOLN
2.0000 g | Freq: Once | INTRAVENOUS | Status: AC
  Administered 2016-03-02: 2 g via INTRAVENOUS
  Filled 2016-03-02: qty 50

## 2016-03-02 MED ORDER — ACETAMINOPHEN 325 MG PO TABS
650.0000 mg | ORAL_TABLET | Freq: Four times a day (QID) | ORAL | Status: DC | PRN
  Administered 2016-03-02 (×3): 650 mg via ORAL
  Filled 2016-03-02 (×3): qty 2

## 2016-03-02 MED ORDER — ENOXAPARIN SODIUM 40 MG/0.4ML ~~LOC~~ SOLN
40.0000 mg | SUBCUTANEOUS | Status: DC
Start: 2016-03-02 — End: 2016-03-04
  Administered 2016-03-02 – 2016-03-03 (×2): 40 mg via SUBCUTANEOUS
  Filled 2016-03-02 (×3): qty 0.4

## 2016-03-02 NOTE — Consult Note (Signed)
Chief Complaint: precerebral AVM  Referring Physician:Dr. Audria Nine  Supervising Physician: Luanne Bras  HPI: Nathan Ballard is an 55 y.o. male who was admitted secondary to seizures.  He has had several since being here and is currently hooked up to an EEG machine.  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  We were asked to see the patient for cerebral angiogram as either an inpatient or an outpatient.    Past Medical History:  Past Medical History  Diagnosis Date  . Hypertension   . Hyperlipidemia   . Anxiety   . Arthritis     Past Surgical History:  Past Surgical History  Procedure Laterality Date  . Anterior cruciate ligament repair  1989, 2007    left  . Shoulder surgery  1999    right  . Ganglion cyst excision  1998    right wrist  . Removal toe nail  1993    right great toe  . Knee arthroscopy with medial menisectomy Right 09/19/2015    Procedure: RIGHT KNEE ARTHROSCOPY WITH PARTIAL MEDIAL MENISCECTOMY;  Surgeon: Leandrew Koyanagi, MD;  Location: [facility];  Service: Orthopedics;  Laterality: Right;    Family History:  Family History  Problem Relation Age of Onset  . Colon cancer Neg Hx   . Stomach cancer Neg Hx   . Diabetes Father   . Heart disease Father   . Hypertension Father   . Alzheimer's disease Mother     Social History:  reports that he has never smoked. He has never used smokeless tobacco. He reports that he does not drink alcohol or use illicit drugs.  Allergies:  Allergies  Allergen Reactions  . Morphine And Related Nausea And Vomiting    Medications:   Medication List    ASK your doctor about these medications        ALPRAZolam 0.25 MG tablet  Commonly known as:  XANAX  Take 0.25 mg by mouth 2 (two) times daily as needed for anxiety.     amphetamine-dextroamphetamine 20 MG 24 hr capsule  Commonly known as:  ADDERALL XR  Take  20 mg by mouth daily.     atorvastatin 40 MG tablet  Commonly known as:  LIPITOR  Take 20 mg by mouth daily.     DULoxetine 30 MG capsule  Commonly known as:  CYMBALTA  Take 30 mg by mouth 2 (two) times daily.     HYDROcodone-acetaminophen 7.5-325 MG tablet  Commonly known as:  NORCO  Take 1-2 tablets by mouth every 6 (six) hours as needed for moderate pain.     multivitamin tablet  Take 1 tablet by mouth daily.     nebivolol 10 MG tablet  Commonly known as:  BYSTOLIC  Take 10 mg by mouth daily.        Please HPI for pertinent positives, otherwise complete 10 system ROS negative.  Mallampati Score: MD Evaluation Airway: WNL Heart: WNL Abdomen: WNL Chest/ Lungs: WNL ASA  Classification: 2 Mallampati/Airway Score: One  Physical Exam: BP 139/81 mmHg  Pulse 56  Temp(Src) 98.2 F (36.8 C) (Oral)  Resp 17  Ht 6' (1.829 m)  Wt 208 lb 12.4 oz (94.7 kg)  BMI 28.31 kg/m2  SpO2 99% Body mass index is 28.31 kg/(m^2). General: pleasant, WD, WN white male who is laying in bed in NAD HEENT: head is normocephalic, atraumatic, with leads  in place right now for EEG.  Sclera are noninjected.  PERRL.  Ears and nose without any masses or lesions.  Mouth is pink and moist Heart: regular, rate, and rhythm.  Normal s1,s2. No obvious murmurs, gallops, or rubs noted.  Palpable radial and pedal pulses bilaterally Lungs: CTAB, no wheezes, rhonchi, or rales noted.  Respiratory effort nonlabored Abd: soft, NT, ND, +BS, no masses, hernias, or organomegaly MS: all 4 extremities are symmetrical with no cyanosis, clubbing, or edema. Skin: warm and dry with no masses, lesions, or rashes Psych: A&Ox3 with an appropriate affect.   Labs: Results for orders placed or performed during the hospital encounter of 02/28/16 (from the past 48 hour(s))  Glucose, capillary     Status: Abnormal   Collection Time: 03/01/16 12:45 AM  Result Value Ref Range   Glucose-Capillary 245 (H) 65 - 99 mg/dL  Basic  metabolic panel     Status: Abnormal   Collection Time: 03/01/16 12:55 AM  Result Value Ref Range   Sodium 138 135 - 145 mmol/L   Potassium 3.3 (L) 3.5 - 5.1 mmol/L   Chloride 103 101 - 111 mmol/L   CO2 22 22 - 32 mmol/L   Glucose, Bld 309 (H) 65 - 99 mg/dL   BUN 13 6 - 20 mg/dL   Creatinine, Ser 1.34 (H) 0.61 - 1.24 mg/dL   Calcium 8.1 (L) 8.9 - 10.3 mg/dL   GFR calc non Af Amer 59 (L) >60 mL/min   GFR calc Af Amer >60 >60 mL/min    Comment: (NOTE) The eGFR has been calculated using the CKD EPI equation. This calculation has not been validated in all clinical situations. eGFR's persistently <60 mL/min signify possible Chronic Kidney Disease.    Anion gap 13 5 - 15  Magnesium     Status: None   Collection Time: 03/01/16 12:55 AM  Result Value Ref Range   Magnesium 2.1 1.7 - 2.4 mg/dL  Troponin I     Status: None   Collection Time: 03/01/16 12:55 AM  Result Value Ref Range   Troponin I <0.03 <0.031 ng/mL    Comment:        NO INDICATION OF MYOCARDIAL INJURY.   Lactic acid, plasma     Status: Abnormal   Collection Time: 03/01/16 12:55 AM  Result Value Ref Range   Lactic Acid, Venous 5.0 (HH) 0.5 - 2.0 mmol/L    Comment: CRITICAL RESULT CALLED TO, READ BACK BY AND VERIFIED WITH: PERRIN,J RN 03/01/2016 0206 JORDANS   Phosphorus     Status: Abnormal   Collection Time: 03/01/16 12:55 AM  Result Value Ref Range   Phosphorus 7.1 (H) 2.5 - 4.6 mg/dL  MRSA PCR Screening     Status: None   Collection Time: 03/01/16  1:20 AM  Result Value Ref Range   MRSA by PCR NEGATIVE NEGATIVE    Comment:        The GeneXpert MRSA Assay (FDA approved for NASAL specimens only), is one component of a comprehensive MRSA colonization surveillance program. It is not intended to diagnose MRSA infection nor to guide or monitor treatment for MRSA infections.   I-STAT 3, arterial blood gas (G3+)     Status: Abnormal   Collection Time: 03/01/16  1:21 AM  Result Value Ref Range   pH, Arterial  7.214 (L) 7.350 - 7.450   pCO2 arterial 65.1 (HH) 35.0 - 45.0 mmHg   pO2, Arterial 363.0 (H) 80.0 - 100.0 mmHg   Bicarbonate 26.3 (H)  20.0 - 24.0 mEq/L   TCO2 28 0 - 100 mmol/L   O2 Saturation 100.0 %   Acid-base deficit 3.0 (H) 0.0 - 2.0 mmol/L   Patient temperature 98.6 F    Collection site RADIAL, ALLEN'S TEST ACCEPTABLE    Drawn by RT    Sample type ARTERIAL    Comment NOTIFIED PHYSICIAN   I-STAT 3, arterial blood gas (G3+)     Status: Abnormal   Collection Time: 03/01/16  2:33 AM  Result Value Ref Range   pH, Arterial 7.381 7.350 - 7.450   pCO2 arterial 38.5 35.0 - 45.0 mmHg   pO2, Arterial 142.0 (H) 80.0 - 100.0 mmHg   Bicarbonate 23.0 20.0 - 24.0 mEq/L   TCO2 24 0 - 100 mmol/L   O2 Saturation 99.0 %   Acid-base deficit 2.0 0.0 - 2.0 mmol/L   Patient temperature 97.5 F    Sample type ARTERIAL   Triglycerides     Status: None   Collection Time: 03/01/16  3:47 AM  Result Value Ref Range   Triglycerides 45 <150 mg/dL  Blood gas, venous     Status: Abnormal   Collection Time: 03/01/16  5:40 AM  Result Value Ref Range   FIO2 0.40    Delivery systems VENTILATOR    Mode CONTINUOUS POSITIVE AIRWAY PRESSURE    Peep/cpap 5.0 cm H20   Pressure support 5.0 cm H20   pH, Ven 7.490 (H) 7.250 - 7.300   pCO2, Ven 32.4 (L) 45.0 - 50.0 mmHg   pO2, Ven 37.2 31.0 - 45.0 mmHg   Bicarbonate 24.4 (H) 20.0 - 24.0 mEq/L   TCO2 25.4 0 - 100 mmol/L   Acid-Base Excess 1.3 0.0 - 2.0 mmol/L   O2 Saturation 74.7 %   Patient temperature 98.6    Drawn by [id number]    Sample type VENOUS   Glucose, capillary     Status: None   Collection Time: 03/01/16  8:19 AM  Result Value Ref Range   Glucose-Capillary 76 65 - 99 mg/dL  Basic metabolic panel     Status: Abnormal   Collection Time: 03/01/16  9:23 AM  Result Value Ref Range   Sodium 143 135 - 145 mmol/L   Potassium 4.2 3.5 - 5.1 mmol/L   Chloride 108 101 - 111 mmol/L   CO2 27 22 - 32 mmol/L   Glucose, Bld 89 65 - 99 mg/dL   BUN 12 6 - 20  mg/dL   Creatinine, Ser 1.04 0.61 - 1.24 mg/dL   Calcium 7.9 (L) 8.9 - 10.3 mg/dL   GFR calc non Af Amer >60 >60 mL/min   GFR calc Af Amer >60 >60 mL/min    Comment: (NOTE) The eGFR has been calculated using the CKD EPI equation. This calculation has not been validated in all clinical situations. eGFR's persistently <60 mL/min signify possible Chronic Kidney Disease.    Anion gap 8 5 - 15  CBC     Status: Abnormal   Collection Time: 03/01/16  9:23 AM  Result Value Ref Range   WBC 6.7 4.0 - 10.5 K/uL   RBC 3.50 (L) 4.22 - 5.81 MIL/uL   Hemoglobin 11.0 (L) 13.0 - 17.0 g/dL   HCT 33.7 (L) 39.0 - 52.0 %   MCV 96.3 78.0 - 100.0 fL   MCH 31.4 26.0 - 34.0 pg   MCHC 32.6 30.0 - 36.0 g/dL   RDW 12.9 11.5 - 15.5 %   Platelets 178 150 - 400 K/uL  Sedimentation  rate     Status: None   Collection Time: 03/01/16  9:23 AM  Result Value Ref Range   Sed Rate 2 0 - 16 mm/hr  TSH     Status: None   Collection Time: 03/01/16  9:23 AM  Result Value Ref Range   TSH 1.340 0.350 - 4.500 uIU/mL  T3, free     Status: None   Collection Time: 03/01/16  9:23 AM  Result Value Ref Range   T3, Free 2.7 2.0 - 4.4 pg/mL    Comment: (NOTE) Performed At: [facility] [address] [place], [place] [id number] Lindon Romp MD YT:[id number]   T4, free     Status: None   Collection Time: 03/01/16  9:23 AM  Result Value Ref Range   Free T4 1.07 0.61 - 1.12 ng/dL  C-reactive protein     Status: None   Collection Time: 03/01/16  9:23 AM  Result Value Ref Range   CRP 0.5 <1.0 mg/dL  Vitamin B12     Status: None   Collection Time: 03/01/16  9:23 AM  Result Value Ref Range   Vitamin B-12 605 180 - 914 pg/mL    Comment: (NOTE) This assay is not validated for testing neonatal or myeloproliferative syndrome specimens for Vitamin B12 levels.   Lactic acid, plasma     Status: None   Collection Time: 03/01/16  9:23 AM  Result Value Ref Range   Lactic Acid, Venous 0.7 0.5 - 2.0 mmol/L  Ammonia      Status: None   Collection Time: 03/01/16  6:33 PM  Result Value Ref Range   Ammonia 22 9 - 35 umol/L  Lactic acid, plasma     Status: None   Collection Time: 03/01/16 11:36 PM  Result Value Ref Range   Lactic Acid, Venous 1.0 0.5 - 2.0 mmol/L  Magnesium     Status: None   Collection Time: 03/02/16  4:48 AM  Result Value Ref Range   Magnesium 1.8 1.7 - 2.4 mg/dL  Phosphorus     Status: None   Collection Time: 03/02/16  4:48 AM  Result Value Ref Range   Phosphorus 3.4 2.5 - 4.6 mg/dL  Glucose, capillary     Status: None   Collection Time: 03/02/16  8:02 AM  Result Value Ref Range   Glucose-Capillary 80 65 - 99 mg/dL   Comment 1 Capillary Specimen    Comment 2 Notify RN     Imaging: Ct Angio Head W/cm &/or Wo Cm  03/01/2016  CLINICAL DATA:  55 year old male with possible overdose, found unresponsive. Syncope, seizure like activity. Initial encounter. EXAM: CT ANGIOGRAPHY HEAD AND NECK TECHNIQUE: Multidetector CT imaging of the head and neck was performed using the standard protocol during bolus administration of intravenous contrast. Multiplanar CT image reconstructions and MIPs were obtained to evaluate the vascular anatomy. Carotid stenosis measurements (when applicable) are obtained utilizing NASCET criteria, using the distal internal carotid diameter as the denominator. CONTRAST:  50 mL Isovue 370. COMPARISON:  Head CT without contrast 0145 hours today. Brain MRI 02/29/2016, and earlier. FINDINGS: CTA NECK Skeleton:  No acute osseous abnormality identified. Right paranasal sinus opacification and bubbly opacity. Mastoids are clear. Other neck: Negative visualized lung parenchyma aside from dependent opacity probably reflecting atelectasis. No superior mediastinal lymphadenopathy. Negative thyroid, larynx, pharynx, parapharyngeal spaces, retropharyngeal space, sublingual space (other than left external carotid vascular findings described below), submandibular glands, and parotid glands.  No cervical lymphadenopathy. Aortic arch: 3 vessel arch configuration.  No arch atherosclerosis. No great vessel origin stenosis. Right carotid system: Minimal soft and calcified plaque at the posterior right ICA origin. No stenosis. Tortuous right ICA just below the skullbase. Left carotid system: Mild mostly calcified plaque at the left ICA origin. No stenosis. Mildly tortuous left ICA. There is a prominent branch of the left ECA (Takeoff series 501, image 99) which tracks into the left sublingual space and oral tongue (see series 506, images 25 and 26) and is asymmetrically enlarged suggesting a mild vascular malformation. Vertebral arteries:No proximal subclavian artery stenosis, despite some calcified plaque on the right. Normal right vertebral artery origin. Mildly tortuous but otherwise negative right vertebral artery to the skullbase. Normal left vertebral artery origin. Tortuous but otherwise negative left vertebral artery to the skullbase. CTA HEAD Posterior circulation: Codominant distal vertebral arteries. Tortuous but otherwise normal vertebrobasilar junction. PICA origins are patent. No basilar stenosis. Normal SCA and PCA origins. Left posterior communicating artery is present, the right is diminutive or absent. Bilateral PCA branches are within normal limits. Anterior circulation: Both ICA siphons are patent with mild dolichoectasia. No siphon plaque or stenosis. Normal left posterior communicating artery origin. Patent carotid termini. Normal MCA and ACA origins. Diminutive anterior communicating artery. Bilateral ACA branches are normal aside from mild tortuosity. Left MCA M1 segment is mildly tortuous, but otherwise the M1, bifurcation, and left MCA branches are within normal limits. Right MCA M1 segment, bifurcation, and right MCA branches are within normal limits. Venous sinuses: Patent. Large developmental venous anomaly incidentally noted in the right superior frontal gyrus (normal variant).  Anatomic variants: None. Delayed phase: No abnormal enhancement identified. Gray-white matter differentiation remains normal. IMPRESSION: 1. Minimal to mild cervical carotid atherosclerosis with no stenosis. Tortuous bilateral carotid arteries and ICA siphons. 2. 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 Recommend outpatient Neuro-endovascular follow-up. 3. Otherwise negative CTA head and neck. 4. Stable and negative CT appearance of the brain. Right frontal lobe DVA (normal variant). Electronically Signed   By: Genevie Ann M.D.   On: 03/01/2016 11:02   Ct Head Wo Contrast  03/01/2016  CLINICAL DATA:  Seizure activity.  Low blood pressure. EXAM: CT HEAD WITHOUT CONTRAST TECHNIQUE: Contiguous axial images were obtained from the base of the skull through the vertex without intravenous contrast. COMPARISON:  MRI brain 02/29/2016.  CT head 02/28/2016. FINDINGS: Ventricles and sulci appear symmetrical. No ventricular dilatation. Vague focal area of increased attenuation in the right posterior frontal lobe with suggestion of a draining vein consistent with venous malformation. This finding was also demonstrated on previous MRI. No mass effect or midline shift. No abnormal extra-axial fluid collections. Gray-white matter junctions are distinct. Basal cisterns are not effaced. No evidence of acute intracranial hemorrhage. No depressed skull fractures. Mucosal thickening in the paranasal sinuses. Inspissated mucus in the right maxillary antrum. Mastoid air cells are not opacified. IMPRESSION: No acute intracranial abnormalities. 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 Electronically Signed   By: Lucienne Capers M.D.   On: 03/01/2016 02:35   Ct Angio Neck W/cm &/or Wo/cm  03/01/2016  CLINICAL DATA:  55 year old male with possible overdose, found unresponsive. Syncope, seizure like activity. Initial encounter. EXAM: CT ANGIOGRAPHY HEAD AND  NECK TECHNIQUE: Multidetector CT imaging of the head and neck was performed using the standard protocol during bolus administration of intravenous contrast. Multiplanar CT image reconstructions and MIPs were obtained to evaluate the vascular anatomy. Carotid stenosis measurements (when  applicable) are obtained utilizing NASCET criteria, using the distal internal carotid diameter as the denominator. CONTRAST:  50 mL Isovue 370 COMPARISON:  Head CT without contrast 0145 hours today. Brain MRI 02/29/2016, and earlier. FINDINGS: CTA NECK Skeleton: No acute osseous abnormality identified. Right paranasal sinus opacification and bubbly opacity. Mastoids are clear. Other neck: Negative visualized lung parenchyma aside from dependent opacity probably reflecting atelectasis. No superior mediastinal lymphadenopathy. Negative thyroid, larynx, pharynx, parapharyngeal spaces, retropharyngeal space, sublingual space (other than left external carotid vascular findings described below), submandibular glands, and parotid glands. No cervical lymphadenopathy. Aortic arch: 3 vessel arch configuration. No arch atherosclerosis. No great vessel origin stenosis. Right carotid system: Minimal soft and calcified plaque at the posterior right ICA origin. No stenosis. Tortuous right ICA just below the skullbase. Left carotid system: Mild mostly calcified plaque at the left ICA origin. No stenosis. Mildly tortuous left ICA. There is a prominent branch of the left ECA (Takeoff series 501, image 99) which tracks into the left sublingual space and oral tongue (see series 506, images 25 and 26) and is asymmetrically enlarged suggesting a mild vascular malformation. Vertebral arteries:No proximal subclavian artery stenosis, despite some calcified plaque on the right. Normal right vertebral artery origin. Mildly tortuous but otherwise negative right vertebral artery to the skullbase. Normal left vertebral artery origin. Tortuous but otherwise  negative left vertebral artery to the skullbase. CTA HEAD Posterior circulation: Codominant distal vertebral arteries. Tortuous but otherwise normal vertebrobasilar junction. PICA origins are patent. No basilar stenosis. Normal SCA and PCA origins. Left posterior communicating artery is present, the right is diminutive or absent. Bilateral PCA branches are within normal limits. Anterior circulation: Both ICA siphons are patent with mild dolichoectasia. No siphon plaque or stenosis. Normal left posterior communicating artery origin. Patent carotid termini. Normal MCA and ACA origins. Diminutive anterior communicating artery. Bilateral ACA branches are normal aside from mild tortuosity. Left MCA M1 segment is mildly tortuous, but otherwise the M1, bifurcation, and left MCA branches are within normal limits. Right MCA M1 segment, bifurcation, and right MCA branches are within normal limits. Venous sinuses: Patent. Large developmental venous anomaly incidentally noted in the right superior frontal gyrus (normal variant). Anatomic variants: None. Delayed phase: No abnormal enhancement identified. Gray-white matter differentiation remains normal. IMPRESSION: 1. Minimal to mild cervical carotid atherosclerosis with no stenosis. Tortuous bilateral carotid arteries and ICA siphons. 2. 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 Recommend outpatient Neuro-endovascular follow-up. 3. Otherwise negative CTA head and neck. 4. Stable and negative CT appearance of the brain. Right frontal lobe DVA (normal variant). Electronically Signed   By: Genevie Ann M.D.   On: 03/01/2016 11:09   Dg Chest Port 1 View  03/01/2016  CLINICAL DATA:  Emergent intubation. EXAM: PORTABLE CHEST 1 VIEW COMPARISON:  09/12/2015 FINDINGS: Endotracheal tube placed with tip measuring 5.3 cm above the carina. Shallow inspiration with atelectasis in the lung bases. Possible focal consolidation in  the left lung base behind the heart. No pneumothorax. No blunting of costophrenic angles. IMPRESSION: Endotracheal tube with tip measuring 5.3 cm above the carina. Shallow inspiration with atelectasis in the lung bases and possible consolidation in the left lung base. Electronically Signed   By: Lucienne Capers M.D.   On: 03/01/2016 01:51    Assessment/Plan 1. precerebral AVM, mild -patient is already NPO p MN tonight.  We will leave him this way.  It is UNLIKELY that we can proceed with  angio tomorrow due to our previous case load; however, if anything gets cancelled then the patient is already NPO in case.  If the patient is likely going to be discharged soon, then we can set this up as an outpatient as this is not emergent.  I have explained this to the patient and he understands. -Risks and Benefits discussed with the patient including, but not limited to bleeding, infection, vascular injury or contrast induced renal failure. All of the patient's questions were answered, patient is agreeable to proceed. Consent signed and in chart. (incase we can proceed while he is here in the hospital)   Thank you for this interesting consult.  I greatly enjoyed meeting ALEXANDR YAWORSKI and look forward to participating in their care.  A copy of this report was sent to the requesting provider on this date.  Electronically Signed: Henreitta Cea 03/02/2016, 9:40 AM   I spent a total of 40 Minutes    in face to face in clinical consultation, greater than 50% of which was counseling/coordinating care for precerebral AVM

## 2016-03-02 NOTE — Procedures (Addendum)
Electroencephalogram report- LTM  Ordering Physician : Dr. Silverio Decamp EEG number: [id number]    Beginning date and time: 03/02/2016 11:30AM Ending date and time:  03/03/2016 7:30AM  Day of study: 2  The best background activity in this tracing was low voltage 8-9 Hz. This activity is symmetric bilaterally and reactive to eye opening and stimulation. Drowsiness and sleep were manifested by background fragmentation, vertex waves, K-complexes and sleep spindles. No focal slowing or epileptiform activity was identified.  There were no push button events during the recording.  IMPRESSION: This EEG shows: No focal or paroxysmal activity  CLINICAL CORRELATION: This is an essentially normal EEG. The nature of patient's episodes is still unclear as none were recorded during the monitoring.    --------------------------------------------------------------------------------------------------------------------------------  Beginning date and time: 03/01/2016 11:30AM Ending date and time:  03/02/2016 11:30AM  Day of study: 1  Medications include: per EMR  MENTAL STATUS (per technician's notes): Alert and Oriented  HISTORY: This 24 hours of intensive EEG monitoring with simultaneous video monitoring was performed for this patient with episodic unresponsiveness. Patient is undergoing EEG to characterize these episodes and guide medical management.  TECHNICAL DESCRIPTION:  The study consists of a continuous 16-channel multi-montage digital video EEG recording with twenty-one electrodes placed according to the International 10-20 System. Additional leads included eye leads, true temporal leads (T1, T2), and an EKG lead. Activation procedures were not done.  REPORT: The best background activity in this tracing was low voltage 8-9 Hz. This activity is symmetric bilaterally and reactive to eye opening and stimulation. Present in the anterior head region is a 15-20 Hz 5-10 uV beta activity. Drowsiness and sleep  were manifested by background fragmentation, vertex waves, K-complexes and sleep spindles. No focal slowing or epileptiform activity was identified.  There were no push button events during the recording.  IMPRESSION: This EEG shows: No focal or paroxysmal activity  CLINICAL CORRELATION: This is an essentially normal EEG. The nature of patient's episodes is still unclear as none were recorded during the monitoring.

## 2016-03-02 NOTE — Progress Notes (Signed)
Dr. Silverio Decamp came at 19:40 and spoke with pt and his wife about plan of care. Said if no more seizure-like events happen overnight, pt could possibly be discharged tomorrow on the current dose of Keppra. Clarified that pt does not need to go to IR tomorrow. Pt asked about followup (they'd already made an appointment with an MD at [facility]), Dr. Silverio Decamp said they could schedule that for a week from now.  Continuous EEG monitoring will continue until AM.  Henreitta Leber, RN 7:56 PM 03/02/2016

## 2016-03-02 NOTE — Progress Notes (Signed)
Interval History:                                                                                                                      MARZELL LEDDON is an 55 y.o. male patient with  seizures, currently on long-term EEG monitoring. His Keppra dose was increased to 1500 mg twice a day, tolerating well with no side effects. He had not had any clinical spells since LTM EEG was started. No new neurological symptoms.   Past Medical History: Past Medical History  Diagnosis Date  . Hypertension   . Hyperlipidemia   . Anxiety   . Arthritis     Past Surgical History  Procedure Laterality Date  . Anterior cruciate ligament repair  1989, 2007    left  . Shoulder surgery  1999    right  . Ganglion cyst excision  1998    right wrist  . Removal toe nail  1993    right great toe  . Knee arthroscopy with medial menisectomy Right 09/19/2015    Procedure: RIGHT KNEE ARTHROSCOPY WITH PARTIAL MEDIAL MENISCECTOMY;  Surgeon: Leandrew Koyanagi, MD;  Location: [facility];  Service: Orthopedics;  Laterality: Right;    Family History: Family History  Problem Relation Age of Onset  . Colon cancer Neg Hx   . Stomach cancer Neg Hx   . Diabetes Father   . Heart disease Father   . Hypertension Father   . Alzheimer's disease Mother     Social History:   reports that he has never smoked. He has never used smokeless tobacco. He reports that he does not drink alcohol or use illicit drugs.  Allergies:  Allergies  Allergen Reactions  . Morphine And Related Nausea And Vomiting     Medications:                                                                                                                         Current facility-administered medications:  .  0.9 %  sodium chloride infusion, , Intravenous, Continuous, Tammy S Parrett, NP, Last Rate: 10 mL/hr at 03/02/16 1800 .  acetaminophen (TYLENOL) tablet 650 mg, 650 mg, Oral, Q6H PRN, Brand Males, MD, 650 mg at 03/02/16 1355 .   ALPRAZolam (XANAX) tablet 0.25 mg, 0.25 mg, Oral, BID PRN, Ivor Costa, MD .  amphetamine-dextroamphetamine (ADDERALL XR) 24 hr capsule 20 mg, 20 mg, Oral, Daily, Ivor Costa, MD, 20 mg at 03/02/16 0803 .  atorvastatin (LIPITOR)  tablet 20 mg, 20 mg, Oral, Daily, Ivor Costa, MD, 20 mg at 03/02/16 0803 .  DULoxetine (CYMBALTA) DR capsule 30 mg, 30 mg, Oral, BID, Ivor Costa, MD, 30 mg at 03/02/16 0810 .  enoxaparin (LOVENOX) injection 40 mg, 40 mg, Subcutaneous, Q24H, Saverio Danker, PA-C, 40 mg at 03/02/16 1136 .  levETIRAcetam (KEPPRA) tablet 1,500 mg, 1,500 mg, Oral, BID, Romona Murdy Fuller Mandril, MD, 1,500 mg at 03/02/16 0804 .  LORazepam (ATIVAN) injection 1 mg, 1 mg, Intravenous, PRN, Ivor Costa, MD .  multivitamin with minerals tablet 1 tablet, 1 tablet, Oral, Daily, Ivor Costa, MD, 1 tablet at 03/02/16 0804 .  nebivolol (BYSTOLIC) tablet 10 mg, 10 mg, Oral, Daily, Ivor Costa, MD, 10 mg at 03/01/16 1035 .  ondansetron (ZOFRAN) tablet 4 mg, 4 mg, Oral, Q6H PRN **OR** ondansetron (ZOFRAN) injection 4 mg, 4 mg, Intravenous, Q6H PRN, Ivor Costa, MD .  pantoprazole (PROTONIX) EC tablet 40 mg, 40 mg, Oral, Daily, Brand Males, MD, 40 mg at 03/02/16 0803 .  sodium chloride flush (NS) 0.9 % injection 3 mL, 3 mL, Intravenous, Q12H, Ivor Costa, MD, 3 mL at 03/02/16 0805   Neurologic Examination:                                                                                                     Today's Vitals   03/02/16 1629 03/02/16 1800 03/02/16 1900 03/02/16 1924  BP:  128/76 133/81   Pulse:  61 50   Temp: 98 F (36.7 C)   98 F (36.7 C)  TempSrc: Oral   Oral  Resp:  16 14   Height:      Weight:      SpO2:  98% 99%   PainSc:        Evaluation of higher integrative functions including: Level of alertness: Alert,  Oriented to time, place and person Speech: fluent, no evidence of dysarthria or aphasia noted.  Test the following cranial nerves: 2-12 grossly intact Motor examination: Normal  tone, bulk, full 5/5 motor strength in all 4 extremities Test coordination: Normal finger nose testing, with no evidence of limb appendicular ataxia or abnormal involuntary movements or tremors noted.     Lab Results: Basic Metabolic Panel:  Recent Labs Lab 02/28/16 2348 02/29/16 0109 03/01/16 0055 03/01/16 0923 03/02/16 0448  NA 142 141 138 143  --   K 4.7 3.8 3.3* 4.2  --   CL 105 105 103 108  --   CO2 27 24 22 27   --   GLUCOSE 138* 91 309* 89  --   BUN 17 16 13 12   --   CREATININE 1.11 0.96 1.34* 1.04  --   CALCIUM 8.8* 8.7* 8.1* 7.9*  --   MG 2.1  --  2.1  --  1.8  PHOS  --   --  7.1*  --  3.4    Liver Function Tests:  Recent Labs Lab 02/28/16 2348 02/29/16 0109  AST 93* 83*  ALT 104* 99*  ALKPHOS 50 49  BILITOT 0.5 0.7  PROT 5.9* 6.0*  ALBUMIN  3.8 3.9   No results for input(s): LIPASE, AMYLASE in the last 168 hours.  Recent Labs Lab 03/01/16 1833  AMMONIA 22    CBC:  Recent Labs Lab 02/28/16 2348 02/29/16 0109 03/01/16 0923  WBC 12.8* 11.1* 6.7  NEUTROABS 10.6*  --   --   HGB 12.6* 12.3* 11.0*  HCT 37.8* 37.0* 33.7*  MCV 95.2 94.4 96.3  PLT 214 212 178    Cardiac Enzymes:  Recent Labs Lab 03/01/16 0055  TROPONINI <0.03    Lipid Panel:  Recent Labs Lab 02/29/16 0129 03/01/16 0347  CHOL 121  --   TRIG 34 45  HDL 50  --   CHOLHDL 2.4  --   VLDL 7  --   LDLCALC 64  --     CBG:  Recent Labs Lab 02/29/16 0759 03/01/16 0045 03/01/16 0819 03/02/16 0802  GLUCAP 73 245* 36 80    Microbiology: Results for orders placed or performed during the hospital encounter of 02/28/16  MRSA PCR Screening     Status: None   Collection Time: 03/01/16  1:20 AM  Result Value Ref Range Status   MRSA by PCR NEGATIVE NEGATIVE Final    Comment:        The GeneXpert MRSA Assay (FDA approved for NASAL specimens only), is one component of a comprehensive MRSA colonization surveillance program. It is not intended to diagnose  MRSA infection nor to guide or monitor treatment for MRSA infections.     Imaging: Ct Angio Head W/cm &/or Wo Cm  03/01/2016  CLINICAL DATA:  55 year old male with possible overdose, found unresponsive. Syncope, seizure like activity. Initial encounter. EXAM: CT ANGIOGRAPHY HEAD AND NECK TECHNIQUE: Multidetector CT imaging of the head and neck was performed using the standard protocol during bolus administration of intravenous contrast. Multiplanar CT image reconstructions and MIPs were obtained to evaluate the vascular anatomy. Carotid stenosis measurements (when applicable) are obtained utilizing NASCET criteria, using the distal internal carotid diameter as the denominator. CONTRAST:  50 mL Isovue 370. COMPARISON:  Head CT without contrast 0145 hours today. Brain MRI 02/29/2016, and earlier. FINDINGS: CTA NECK Skeleton:  No acute osseous abnormality identified. Right paranasal sinus opacification and bubbly opacity. Mastoids are clear. Other neck: Negative visualized lung parenchyma aside from dependent opacity probably reflecting atelectasis. No superior mediastinal lymphadenopathy. Negative thyroid, larynx, pharynx, parapharyngeal spaces, retropharyngeal space, sublingual space (other than left external carotid vascular findings described below), submandibular glands, and parotid glands. No cervical lymphadenopathy. Aortic arch: 3 vessel arch configuration. No arch atherosclerosis. No great vessel origin stenosis. Right carotid system: Minimal soft and calcified plaque at the posterior right ICA origin. No stenosis. Tortuous right ICA just below the skullbase. Left carotid system: Mild mostly calcified plaque at the left ICA origin. No stenosis. Mildly tortuous left ICA. There is a prominent branch of the left ECA (Takeoff series 501, image 99) which tracks into the left sublingual space and oral tongue (see series 506, images 25 and 26) and is asymmetrically enlarged suggesting a mild vascular  malformation. Vertebral arteries:No proximal subclavian artery stenosis, despite some calcified plaque on the right. Normal right vertebral artery origin. Mildly tortuous but otherwise negative right vertebral artery to the skullbase. Normal left vertebral artery origin. Tortuous but otherwise negative left vertebral artery to the skullbase. CTA HEAD Posterior circulation: Codominant distal vertebral arteries. Tortuous but otherwise normal vertebrobasilar junction. PICA origins are patent. No basilar stenosis. Normal SCA and PCA origins. Left posterior communicating artery is present, the right  is diminutive or absent. Bilateral PCA branches are within normal limits. Anterior circulation: Both ICA siphons are patent with mild dolichoectasia. No siphon plaque or stenosis. Normal left posterior communicating artery origin. Patent carotid termini. Normal MCA and ACA origins. Diminutive anterior communicating artery. Bilateral ACA branches are normal aside from mild tortuosity. Left MCA M1 segment is mildly tortuous, but otherwise the M1, bifurcation, and left MCA branches are within normal limits. Right MCA M1 segment, bifurcation, and right MCA branches are within normal limits. Venous sinuses: Patent. Large developmental venous anomaly incidentally noted in the right superior frontal gyrus (normal variant). Anatomic variants: None. Delayed phase: No abnormal enhancement identified. Gray-white matter differentiation remains normal. IMPRESSION: 1. Minimal to mild cervical carotid atherosclerosis with no stenosis. Tortuous bilateral carotid arteries and ICA siphons. 2. 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 Recommend outpatient Neuro-endovascular follow-up. 3. Otherwise negative CTA head and neck. 4. Stable and negative CT appearance of the brain. Right frontal lobe DVA (normal variant). Electronically Signed   By: Genevie Ann M.D.   On: 03/01/2016 11:02    Ct Head Wo Contrast  03/01/2016  CLINICAL DATA:  Seizure activity.  Low blood pressure. EXAM: CT HEAD WITHOUT CONTRAST TECHNIQUE: Contiguous axial images were obtained from the base of the skull through the vertex without intravenous contrast. COMPARISON:  MRI brain 02/29/2016.  CT head 02/28/2016. FINDINGS: Ventricles and sulci appear symmetrical. No ventricular dilatation. Vague focal area of increased attenuation in the right posterior frontal lobe with suggestion of a draining vein consistent with venous malformation. This finding was also demonstrated on previous MRI. No mass effect or midline shift. No abnormal extra-axial fluid collections. Gray-white matter junctions are distinct. Basal cisterns are not effaced. No evidence of acute intracranial hemorrhage. No depressed skull fractures. Mucosal thickening in the paranasal sinuses. Inspissated mucus in the right maxillary antrum. Mastoid air cells are not opacified. IMPRESSION: No acute intracranial abnormalities. 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 Electronically Signed   By: Lucienne Capers M.D.   On: 03/01/2016 02:35   Ct Head Wo Contrast  02/29/2016  CLINICAL DATA:  Syncope, possible apnea. 3-4 possible seizures. History of hypertension hyperlipidemia. EXAM: CT HEAD WITHOUT CONTRAST TECHNIQUE: Contiguous axial images were obtained from the base of the skull through the vertex without intravenous contrast. COMPARISON:  MRI of the brain November 07, 2007 FINDINGS: INTRACRANIAL CONTENTS: The ventricles and sulci are normal. No intraparenchymal hemorrhage, mass effect nor midline shift. No acute large vascular territory infarcts. RIGHT frontal developmental venous anomaly better demonstrated on prior contrast-enhanced MRI. No abnormal extra-axial fluid collections. Basal cisterns are patent. ORBITS: The included ocular globes and orbital contents are normal. SINUSES: RIGHT maxillary sinus mucosal thickening with air-fluid level.  Mild fronto ethmoidal mucosal thickening. Subcentimeter RIGHT frontal osteoma. Mastoid air cells are well aerated. SKULL/SOFT TISSUES: No skull fracture. No significant soft tissue swelling. IMPRESSION: Negative CT head. Mild acute paranasal sinusitis. Electronically Signed   By: Elon Alas M.D.   On: 02/29/2016 00:02   Ct Angio Neck W/cm &/or Wo/cm  03/01/2016  CLINICAL DATA:  55 year old male with possible overdose, found unresponsive. Syncope, seizure like activity. Initial encounter. EXAM: CT ANGIOGRAPHY HEAD AND NECK TECHNIQUE: Multidetector CT imaging of the head and neck was performed using the standard protocol during bolus administration of intravenous contrast. Multiplanar CT image reconstructions and MIPs were obtained to evaluate the vascular anatomy. Carotid stenosis measurements (when applicable) are obtained utilizing  NASCET criteria, using the distal internal carotid diameter as the denominator. CONTRAST:  50 mL Isovue 370 COMPARISON:  Head CT without contrast 0145 hours today. Brain MRI 02/29/2016, and earlier. FINDINGS: CTA NECK Skeleton: No acute osseous abnormality identified. Right paranasal sinus opacification and bubbly opacity. Mastoids are clear. Other neck: Negative visualized lung parenchyma aside from dependent opacity probably reflecting atelectasis. No superior mediastinal lymphadenopathy. Negative thyroid, larynx, pharynx, parapharyngeal spaces, retropharyngeal space, sublingual space (other than left external carotid vascular findings described below), submandibular glands, and parotid glands. No cervical lymphadenopathy. Aortic arch: 3 vessel arch configuration. No arch atherosclerosis. No great vessel origin stenosis. Right carotid system: Minimal soft and calcified plaque at the posterior right ICA origin. No stenosis. Tortuous right ICA just below the skullbase. Left carotid system: Mild mostly calcified plaque at the left ICA origin. No stenosis. Mildly tortuous left  ICA. There is a prominent branch of the left ECA (Takeoff series 501, image 99) which tracks into the left sublingual space and oral tongue (see series 506, images 25 and 26) and is asymmetrically enlarged suggesting a mild vascular malformation. Vertebral arteries:No proximal subclavian artery stenosis, despite some calcified plaque on the right. Normal right vertebral artery origin. Mildly tortuous but otherwise negative right vertebral artery to the skullbase. Normal left vertebral artery origin. Tortuous but otherwise negative left vertebral artery to the skullbase. CTA HEAD Posterior circulation: Codominant distal vertebral arteries. Tortuous but otherwise normal vertebrobasilar junction. PICA origins are patent. No basilar stenosis. Normal SCA and PCA origins. Left posterior communicating artery is present, the right is diminutive or absent. Bilateral PCA branches are within normal limits. Anterior circulation: Both ICA siphons are patent with mild dolichoectasia. No siphon plaque or stenosis. Normal left posterior communicating artery origin. Patent carotid termini. Normal MCA and ACA origins. Diminutive anterior communicating artery. Bilateral ACA branches are normal aside from mild tortuosity. Left MCA M1 segment is mildly tortuous, but otherwise the M1, bifurcation, and left MCA branches are within normal limits. Right MCA M1 segment, bifurcation, and right MCA branches are within normal limits. Venous sinuses: Patent. Large developmental venous anomaly incidentally noted in the right superior frontal gyrus (normal variant). Anatomic variants: None. Delayed phase: No abnormal enhancement identified. Gray-white matter differentiation remains normal. IMPRESSION: 1. Minimal to mild cervical carotid atherosclerosis with no stenosis. Tortuous bilateral carotid arteries and ICA siphons. 2. 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 Recommend  outpatient Neuro-endovascular follow-up. 3. Otherwise negative CTA head and neck. 4. Stable and negative CT appearance of the brain. Right frontal lobe DVA (normal variant). Electronically Signed   By: Genevie Ann M.D.   On: 03/01/2016 11:09   Mr Jeri Cos [id number] Contrast  02/29/2016  CLINICAL DATA:  Possible overdose yesterday evening, found unresponsive on floor. Evaluate seizure like activity. EXAM: MRI HEAD WITHOUT AND WITH CONTRAST TECHNIQUE: Multiplanar, multiecho pulse sequences of the brain and surrounding structures were obtained without and with intravenous contrast. CONTRAST:  15 cc MultiHance COMPARISON:  CT head February 28, 2016 and MRI of the brain November 07, 2007 FINDINGS: The ventricles and sulci are normal for patient's age. No abnormal parenchymal signal, mass lesions, mass effect. No abnormal parenchymal enhancement. Re- demonstration of RIGHT frontal developmental venous anomaly. No susceptibility artifact is suggests associated cavernoma. A few punctate supratentorial white matter FLAIR T2 hyperintensities are less than expected for age, most compatible with chronic small vessel ischemic disease. No reduced diffusion to suggest acute ischemia.  Hippocampi demonstrate symmetric size, morphology and signal. No abnormal extra-axial fluid collections. No extra-axial masses nor leptomeningeal enhancement. Normal major intracranial vascular flow voids seen at the skull base. Ocular globes and orbital contents are unremarkable though not tailored for evaluation. No suspicious calvarial bone marrow signal. No abnormal sellar expansion. Craniocervical junction maintained. RIGHT maxillary sinus air-fluid level. Mild paranasal sinus mucosal thickening. Mastoid air cells are well aerated. IMPRESSION: No acute intracranial process. Stable RIGHT frontal developmental venous anomaly ; otherwise negative MRI of the brain with contrast for age. Electronically Signed   By: Elon Alas M.D.   On: 02/29/2016 02:56    US Abdomen Complete  02/28/2016  CLINICAL DATA:  Abnormal liver function studies, history of hypertension. EXAM: ABDOMEN ULTRASOUND COMPLETE COMPARISON:  None. FINDINGS: Gallbladder: No gallstones or wall thickening visualized. No sonographic Murphy sign noted by sonographer. Common bile duct: Diameter: 3.2 mm Liver: The hepatic echotexture is normal. There is no focal mass or ductal dilation. The surface contour of the liver is normal. IVC: No abnormality visualized. Pancreas: Evaluation of the pancreas was not possible due to excessive bowel gas. Spleen: Size and appearance within normal limits. Right Kidney: Length: 11.8 cm. Echogenicity within normal limits. No mass or hydronephrosis visualized. Left Kidney: Length: 12.4 cm. Echogenicity within normal limits. No mass or hydronephrosis visualized. Abdominal aorta: There is no abdominal aortic aneurysm. Other findings: There is no ascites. IMPRESSION: 1. Normal abdominal ultrasound examination. No gallstones. If there are clinical concerns of gallbladder dysfunction, a nuclear medicine hepatobiliary scan may be useful. 2. Nonvisualization of the pancreas due to bowel gas. Electronically Signed   By: David  Martinique M.D.   On: 02/28/2016 10:12   Dg Chest Port 1 View  03/01/2016  CLINICAL DATA:  Emergent intubation. EXAM: PORTABLE CHEST 1 VIEW COMPARISON:  09/12/2015 FINDINGS: Endotracheal tube placed with tip measuring 5.3 cm above the carina. Shallow inspiration with atelectasis in the lung bases. Possible focal consolidation in the left lung base behind the heart. No pneumothorax. No blunting of costophrenic angles. IMPRESSION: Endotracheal tube with tip measuring 5.3 cm above the carina. Shallow inspiration with atelectasis in the lung bases and possible consolidation in the left lung base. Electronically Signed   By: Lucienne Capers M.D.   On: 03/01/2016 01:51    Assessment and plan:   VITTORIO KLUMB is an 55 y.o. male patient with seizures, on  Keppra 1500 twice a day, tolerating well. Long-term EEG monitoring did not reveal any abnormalities. He did not have any further seizures while on the LTM EEG. We'll plan to discontinue the EEG tomorrow morning and discharged home if he remains stable overnight. He'll follow-up with outpatient neurology.  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 Placed interventional radiology consultation to evaluate for any further treatment options. Consult is still pending.  We'll follow-up.

## 2016-03-02 NOTE — Consult Note (Signed)
PULMONARY / CRITICAL CARE MEDICINE   Name: Nathan Ballard MRN: [id number] DOB: 1961/03/01    ADMISSION DATE:  02/28/2016 CONSULTATION DATE:  03/01/2016  REFERRING MD :  Dr. Blaine Hamper  CHIEF COMPLAINT:  [place]  INITIAL PRESENTATION:  55 yo male with h/o HTN, HLD, depression anxiety who had had 3 episodes of of shaking and LOC per wife over the last month.  He was admitted after he was found down by son and got CPR.  EMS gave narcan with resolution of symptoms however, UDS negative in the ED.  SIGNIFICANT EVENTS: 4/22> became unresponsive on the floor with O2 sat 19%, and gurgling noise, no lost pulse (See Rapid response note for full details).  He was intubated for airway protection, no CPR was performed, he awoke confused, became hypotensive and was treated with fluid resuscitation, BG at that time was normal.  He was transferred to the ICU under the CCM service.    SUBJECTIVE:  Sitting up in bed awake and f/c.  Continuous EEG in place -to finish 4/24.   VITAL SIGNS: Temp:  [98.1 F (36.7 C)-98.7 F (37.1 C)] 98.1 F (36.7 C) (04/23 1138) Pulse Rate:  [49-70] 62 (04/23 1100) Resp:  [10-20] 14 (04/23 1100) BP: (113-139)/(62-86) 138/76 mmHg (04/23 1100) SpO2:  [92 %-100 %] 98 % (04/23 1100) HEMODYNAMICS:   VENTILATOR SETTINGS:   INTAKE / OUTPUT:  Intake/Output Summary (Last 24 hours) at 03/02/16 1205 Last data filed at 03/02/16 1100  Gross per 24 hour  Intake   2808 ml  Output   3000 ml  Net   -192 ml    PHYSICAL EXAMINATION: General:  NAD  Neuro:  CN-II-XII intact, moves all extremities HEENT:  NCAT, Cardiovascular:  RRR, S1/s2 no m/r/g Lungs:  Mechanical breath sounds bilaterally. No w/r/r Abdomen:  Soft, non tender, normal bowel sounds Musculoskeletal:  Normal bulk and tone Skin:  No c/c/e  LABS:  CBC  Recent Labs Lab 02/28/16 2348 02/29/16 0109 03/01/16 0923  WBC 12.8* 11.1* 6.7  HGB 12.6* 12.3* 11.0*  HCT 37.8* 37.0* 33.7*  PLT 214 212 178    Coag's  Recent Labs Lab 02/29/16 0108  APTT 25  INR 1.10   BMET  Recent Labs Lab 02/29/16 0109 03/01/16 0055 03/01/16 0923  NA 141 138 143  K 3.8 3.3* 4.2  CL 105 103 108  CO2 24 22 27   BUN 16 13 12   CREATININE 0.96 1.34* 1.04  GLUCOSE 91 309* 89   Electrolytes  Recent Labs Lab 02/28/16 2348 02/29/16 0109 03/01/16 0055 03/01/16 0923 03/02/16 0448  CALCIUM 8.8* 8.7* 8.1* 7.9*  --   MG 2.1  --  2.1  --  1.8  PHOS  --   --  7.1*  --  3.4   Sepsis Markers  Recent Labs Lab 03/01/16 0055 03/01/16 0923 03/01/16 2336  LATICACIDVEN 5.0* 0.7 1.0   ABG  Recent Labs Lab 03/01/16 0121 03/01/16 0233  PHART 7.214* 7.381  PCO2ART 65.1* 38.5  PO2ART 363.0* 142.0*   Liver Enzymes  Recent Labs Lab 02/28/16 2348 02/29/16 0109  AST 93* 83*  ALT 104* 99*  ALKPHOS 50 49  BILITOT 0.5 0.7  ALBUMIN 3.8 3.9   Cardiac Enzymes  Recent Labs Lab 03/01/16 0055  TROPONINI <0.03   Glucose  Recent Labs Lab 02/29/16 0759 03/01/16 0045 03/01/16 0819 03/02/16 0802  GLUCAP 73 245* 76 80    Imaging No results found.   ASSESSMENT / PLAN: 55 yo male with LOC today  concerning for seizure given hypoxemia, post-ictal state and elevated lactate.    PULMONARY A: Acute hypoxic and hypercapnic respiratory failure -intubated briefly with extubation on 4/22.  P:    O2 As needed  For O2 sat >90%.   CARDIOVASCULAR  A:  HTN  Hypotension resolved 4/22 with fluid bolus  P:  Continue on Bystolic   RENAL A:   Hypertension P:   Cont home meds  GASTROINTESTINAL A:  Not acitve P:   Diet as tol  NPO p Midnight   HEMATOLOGIC A:   Mild anemia  P:  Monitor  Transfuse per protcol   INFECTIOUS A:   Not active P:    ENDOCRINE A:   HLD   P:   Continue home meds - maintain BG >180  NEUROLOGIC A:   Seizure with post-ictal state CT A Head Min carotid atheroscleroiss , no stenosis . 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 I have discussed with resident/NP and other care providers such as pharmacist, RN and RRT.  In addition,  I personally evaluated patient and elicited key findings of   S: d/w APP and RN - no issues. On c-eeg in icu bed. Able to go to bathroom  O: sitting, talking, watching TV. Family at side Looks well\ Non focal neuro EEG dressing +  CTA head shows oral AVM   Recent Labs Lab 02/28/16 2348 02/29/16 0109 03/01/16 0923  HGB 12.6* 12.3* 11.0*  HCT 37.8* 37.0* 33.7*  WBC 12.8* 11.1* 6.7  PLT 214 212 178    Recent Labs Lab 02/28/16 2348 02/29/16 0109 03/01/16 0055 03/01/16 0923 03/02/16 0448  NA 142 141 138 143  --   K 4.7 3.8 3.3* 4.2  --   CL 105 105 103 108  --   CO2 27 24 22 27   --   GLUCOSE 138* 91 309* 89  --   BUN 17 16 13 12   --   CREATININE 1.11 0.96 1.34* 1.04  --   CALCIUM 8.8* 8.7* 8.1* 7.9*  --   MG 2.1  --  2.1  --  1.8  PHOS  --   --  7.1*  --  3.4     A: status epilepticus - on ceeg and neuro consult Acute resp fialure - resolved 03/01/16 Mild hypomag < 2gm% - will replete now AVM oral area - ? APP called IR consult  Move to SDU status. PCCM off from 03/03/16 and TRH primary - d/w Dr Thereasa Solo  .  Rest per NP/medical resident whose note is outlined above and that I agree with  Dr. Brand Males, M.D., [facility].C.P Pulmonary and Critical Care Medicine Staff Physician [facility] Pager: [phone], If no answer or  between  15:00h - 7:00h: call [phone]  03/02/2016 3:17 PM

## 2016-03-03 ENCOUNTER — Institutional Professional Consult (permissible substitution): Payer: Managed Care, Other (non HMO) | Admitting: Neurology

## 2016-03-03 LAB — CBC
HCT: 34.7 % — ABNORMAL LOW (ref 39.0–52.0)
Hemoglobin: 11.7 g/dL — ABNORMAL LOW (ref 13.0–17.0)
MCH: 31.7 pg (ref 26.0–34.0)
MCHC: 33.7 g/dL (ref 30.0–36.0)
MCV: 94 fL (ref 78.0–100.0)
PLATELETS: 188 10*3/uL (ref 150–400)
RBC: 3.69 MIL/uL — AB (ref 4.22–5.81)
RDW: 12.5 % (ref 11.5–15.5)
WBC: 5.8 10*3/uL (ref 4.0–10.5)

## 2016-03-03 LAB — BASIC METABOLIC PANEL
ANION GAP: 11 (ref 5–15)
BUN: 9 mg/dL (ref 6–20)
CO2: 25 mmol/L (ref 22–32)
Calcium: 8.4 mg/dL — ABNORMAL LOW (ref 8.9–10.3)
Chloride: 105 mmol/L (ref 101–111)
Creatinine, Ser: 0.88 mg/dL (ref 0.61–1.24)
Glucose, Bld: 102 mg/dL — ABNORMAL HIGH (ref 65–99)
POTASSIUM: 4 mmol/L (ref 3.5–5.1)
SODIUM: 141 mmol/L (ref 135–145)

## 2016-03-03 LAB — GLUCOSE, CAPILLARY: GLUCOSE-CAPILLARY: 88 mg/dL (ref 65–99)

## 2016-03-03 LAB — MAGNESIUM: Magnesium: 1.9 mg/dL (ref 1.7–2.4)

## 2016-03-03 LAB — PHOSPHORUS: Phosphorus: 4.4 mg/dL (ref 2.5–4.6)

## 2016-03-03 MED ORDER — ACETAMINOPHEN 500 MG PO TABS
500.0000 mg | ORAL_TABLET | Freq: Four times a day (QID) | ORAL | Status: DC | PRN
  Administered 2016-03-03 – 2016-03-04 (×2): 500 mg via ORAL
  Filled 2016-03-03 (×2): qty 1

## 2016-03-03 MED ORDER — GADOBENATE DIMEGLUMINE 529 MG/ML IV SOLN
15.0000 mL | Freq: Once | INTRAVENOUS | Status: AC | PRN
  Administered 2016-02-29: 15 mL via INTRAVENOUS

## 2016-03-03 MED ORDER — IOPAMIDOL (ISOVUE-370) INJECTION 76%
50.0000 mL | Freq: Once | INTRAVENOUS | Status: AC | PRN
  Administered 2016-03-01: 50 mL via INTRAVENOUS

## 2016-03-03 NOTE — Clinical Documentation Improvement (Signed)
Internal Medicine  Based on the clinical findings below, please document in next progress note any associated diagnoses/conditions the patient has or may have.   Other  Clinically Undetermined  Supporting Information:  S/P respiratory arrest  Became hypotensive - 51/35 with Map of 42, bradycardic - 50 to 55  1L NS fluid resuscitation given  Please exercise your independent, professional judgment when responding. A specific answer is not anticipated or expected.  Thank You, Zoila Shutter RN, BSN, [place] [phone]; Cell: [phone]

## 2016-03-03 NOTE — Progress Notes (Signed)
[place] TEAM 1 - Stepdown/ICU TEAM PROGRESS NOTE  Nathan Ballard [id number] DOB: 07/03/1961 DOA: 02/28/2016 PCP: Nathan Gravel, MD  Admit HPI / Brief Narrative: 55 yo male with h/o HTN, HLD, depression, and anxiety who had 3 episodes of of shaking and LOC over the preceding month. He was admitted after he was found down by son and got CPR. EMS gave narcan with reported resolution of symptoms, however a UDS was negative in the ED.  Significant Events: 4/20 admit after found down at home 4/21 unresponsive in hospital room - desaturated - kept pulse - intubated/transferred to ICU - hypotensive  4/22 extubated  HPI/Subjective: Pt is resting in bed comfortably.  He has no complaints, and is motivated to go home.    Assessment/Plan:  Seizure with post-ictal state Care per Neurology - appears to be controlled w/ meds - has been instructed that he can not drive for at least 6 months, and that he should avoid any activity which could lead to his death should he suffer a seizure while involved in said activity   Sublingual and oral tongue AVM  Incidentally noted on CT angio head/neck obtained in w/u of Sz - to have formal cerebral angio in AM per IR to investigate   Acute hypoxic and hypercapnic respiratory failure intubated briefly with extubation on 4/22 - resolved - felt to be solely due to seizure activity   Hypotension resolved 4/22 with fluid bolus   HTN  BP currently reasonably controlled   Mild anemia   Mild transamintis  Acute hepatitis panel negative - had liver US prior to admit which was unrevealing - recheck in AM   HLD  Cont home lipitor dose  Code Status: FULL Family Communication: spoke w/ pt and wife at bedside  Disposition Plan: transfer to med bed - possible d/c home after angiogram 4/25  Consultants: PCCM Neurology   Antibiotics: none  DVT prophylaxis: lovenox   Objective: Blood pressure 139/83, pulse 52, temperature 97.8 F (36.6 C),  temperature source Oral, resp. rate 12, height 6' (1.829 m), weight 94.7 kg (208 lb 12.4 oz), SpO2 98 %.  Intake/Output Summary (Last 24 hours) at 03/03/16 1019 Last data filed at 03/03/16 [id number]  Gross per 24 hour  Intake 639.25 ml  Output   2625 ml  Net -1985.75 ml    Exam: General: No acute respiratory distress Lungs: Clear to auscultation bilaterally without wheezes or crackles Cardiovascular: Regular rate and rhythm without murmur gallop or rub normal S1 and S2 Abdomen: Nontender, nondistended, soft, bowel sounds positive, no rebound, no ascites, no appreciable mass Extremities: No significant cyanosis, clubbing, or edema bilateral lower extremities  Data Reviewed: Basic Metabolic Panel:  Recent Labs Lab 02/28/16 2348 02/29/16 0109 03/01/16 0055 03/01/16 0923 03/02/16 0448 03/03/16 0240  NA 142 141 138 143  --  141  K 4.7 3.8 3.3* 4.2  --  4.0  CL 105 105 103 108  --  105  CO2 27 24 22 27   --  25  GLUCOSE 138* 91 309* 89  --  102*  BUN 17 16 13 12   --  9  CREATININE 1.11 0.96 1.34* 1.04  --  0.88  CALCIUM 8.8* 8.7* 8.1* 7.9*  --  8.4*  MG 2.1  --  2.1  --  1.8 1.9  PHOS  --   --  7.1*  --  3.4 4.4    CBC:  Recent Labs Lab 02/28/16 2348 02/29/16 0109 03/01/16 0923 03/03/16 0240  WBC  12.8* 11.1* 6.7 5.8  NEUTROABS 10.6*  --   --   --   HGB 12.6* 12.3* 11.0* 11.7*  HCT 37.8* 37.0* 33.7* 34.7*  MCV 95.2 94.4 96.3 94.0  PLT 214 212 178 188    Liver Function Tests:  Recent Labs Lab 02/28/16 2348 02/29/16 0109  AST 93* 83*  ALT 104* 99*  ALKPHOS 50 49  BILITOT 0.5 0.7  PROT 5.9* 6.0*  ALBUMIN 3.8 3.9    Recent Labs Lab 03/01/16 1833  AMMONIA 22    Coags:  Recent Labs Lab 02/29/16 0108  INR 1.10    Recent Labs Lab 02/29/16 0108  APTT 25    Cardiac Enzymes:  Recent Labs Lab 03/01/16 0055  TROPONINI <0.03    CBG:  Recent Labs Lab 02/29/16 0759 03/01/16 0045 03/01/16 0819 03/02/16 0802 03/03/16 0753  GLUCAP 73 245* 76  80 88    Recent Results (from the past 240 hour(s))  MRSA PCR Screening     Status: None   Collection Time: 03/01/16  1:20 AM  Result Value Ref Range Status   MRSA by PCR NEGATIVE NEGATIVE Final    Comment:        The GeneXpert MRSA Assay (FDA approved for NASAL specimens only), is one component of a comprehensive MRSA colonization surveillance program. It is not intended to diagnose MRSA infection nor to guide or monitor treatment for MRSA infections.      Studies:   Recent x-ray studies have been reviewed in detail by the Attending Physician  Scheduled Meds:  Scheduled Meds: . amphetamine-dextroamphetamine  20 mg Oral Daily  . atorvastatin  20 mg Oral Daily  . DULoxetine  30 mg Oral BID  . enoxaparin (LOVENOX) injection  40 mg Subcutaneous Q24H  . levETIRAcetam  1,500 mg Oral BID  . multivitamin with minerals  1 tablet Oral Daily  . nebivolol  10 mg Oral Daily  . pantoprazole  40 mg Oral Daily  . sodium chloride flush  3 mL Intravenous Q12H    Time spent on care of this patient: 35 mins   Nathan Ballard , MD   [facility] Office  [phone] Pager - Text Page per Nathan Ballard as per below:  On-Call/Text Page:      [email or web address]      password [email or web address]  If 7PM-7AM, please contact night-coverage [email or web address] Password [email or web address] 03/03/2016, 10:19 AM   LOS: 2 days

## 2016-03-03 NOTE — Progress Notes (Signed)
LTVM taken down, skin breakdown noted at FP1, FP2, F3, F4, F7, F8, A1, A2, FZ

## 2016-03-03 NOTE — Care Management Note (Signed)
Case Management Note  Patient Details  Name: Nathan Ballard MRN: [id number] Date of Birth: [birth year]  Subjective/Objective:                    Action/Plan: Continued medical treatment. CM following for discharge needs.   Expected Discharge Date:  03/03/16               Expected Discharge Plan:     In-House Referral:     Discharge planning Services     Post Acute Care Choice:    Choice offered to:     DME Arranged:    DME Agency:     HH Arranged:    HH Agency:     Status of Service:  In process, will continue to follow  Medicare Important Message Given:    Date Medicare IM Given:    Medicare IM give by:    Date Additional Medicare IM Given:    Additional Medicare Important Message give by:     If discussed at [place] of Stay Meetings, dates discussed:    Additional Comments:  Pollie Friar, RN 03/03/2016, 2:54 PM

## 2016-03-03 NOTE — Progress Notes (Signed)
Subjective: No further spells  Exam: Filed Vitals:   03/03/16 0755 03/03/16 0800  BP:  136/83  Pulse:  55  Temp: 97.8 F (36.6 C)   Resp:  13   Gen: In bed, NAD Resp: non-labored breathing, no acute distress Abd: soft, nt  Neuro: MS: awake, alert, interactive and appropriate [id number], EOMI Motor: MAEW Sensory: intact to LT  Pertinent Labs: Bmp - unremarkable  Impression: 55 yo M With recurrent episodes concerning for seizures. He is now spell free on keppra 1500mg  BID. I do nto think that further EEG monitoring is likely to be helpful at this time, and can discontinue this. No change on telemetry to explain symptoms for event in the hospital.   Recommendations: 1) Continue keppra 1500mg  BID 2) will defer to IR regarding tongue AVM, this is an incidental finding which I do not feel is related to his symptoms.  3) follow up with neurology as an outpatient 4) Please call with any further questions or concerns.   Roland Rack, MD [facility] [phone]  If 7pm- 7am, please page neurology on call as listed in [place].

## 2016-03-04 ENCOUNTER — Inpatient Hospital Stay (HOSPITAL_COMMUNITY): Payer: Managed Care, Other (non HMO)

## 2016-03-04 ENCOUNTER — Institutional Professional Consult (permissible substitution): Payer: Managed Care, Other (non HMO) | Admitting: Emergency Medicine

## 2016-03-04 DIAGNOSIS — I10 Essential (primary) hypertension: Secondary | ICD-10-CM

## 2016-03-04 DIAGNOSIS — E785 Hyperlipidemia, unspecified: Secondary | ICD-10-CM

## 2016-03-04 LAB — COMPREHENSIVE METABOLIC PANEL
ALK PHOS: 48 U/L (ref 38–126)
ALT: 44 U/L (ref 17–63)
ANION GAP: 7 (ref 5–15)
AST: 24 U/L (ref 15–41)
Albumin: 3.1 g/dL — ABNORMAL LOW (ref 3.5–5.0)
BILIRUBIN TOTAL: 0.6 mg/dL (ref 0.3–1.2)
BUN: 10 mg/dL (ref 6–20)
CALCIUM: 8.3 mg/dL — AB (ref 8.9–10.3)
CO2: 29 mmol/L (ref 22–32)
CREATININE: 0.97 mg/dL (ref 0.61–1.24)
Chloride: 104 mmol/L (ref 101–111)
GFR calc non Af Amer: >60 mL/min (ref >60)
GLUCOSE: 103 mg/dL — AB (ref 65–99)
Potassium: 3.8 mmol/L (ref 3.5–5.1)
SODIUM: 140 mmol/L (ref 135–145)
TOTAL PROTEIN: 5.3 g/dL — AB (ref 6.5–8.1)

## 2016-03-04 MED ORDER — FENTANYL CITRATE (PF) 100 MCG/2ML IJ SOLN
INTRAMUSCULAR | Status: AC | PRN
  Administered 2016-03-04: 12.5 ug via INTRAVENOUS
  Administered 2016-03-04: 25 ug via INTRAVENOUS
  Administered 2016-03-04: 12.5 ug via INTRAVENOUS

## 2016-03-04 MED ORDER — FENTANYL CITRATE (PF) 100 MCG/2ML IJ SOLN
INTRAMUSCULAR | Status: AC
  Filled 2016-03-04: qty 2

## 2016-03-04 MED ORDER — SODIUM CHLORIDE 0.9 % IV SOLN: INTRAVENOUS | Status: AC

## 2016-03-04 MED ORDER — MIDAZOLAM HCL 2 MG/2ML IJ SOLN
INTRAMUSCULAR | Status: AC
  Filled 2016-03-04: qty 2

## 2016-03-04 MED ORDER — LORAZEPAM 2 MG/ML IJ SOLN
INTRAMUSCULAR | Status: AC | PRN
  Administered 2016-03-04: 0.5 mg via INTRAVENOUS

## 2016-03-04 MED ORDER — IOPAMIDOL (ISOVUE-300) INJECTION 61%
INTRAVENOUS | Status: AC
  Administered 2016-03-04: 15 mL
  Filled 2016-03-04: qty 50

## 2016-03-04 MED ORDER — LIDOCAINE HCL 1 % IJ SOLN
INTRAMUSCULAR | Status: AC
  Administered 2016-03-04: 10 mL
  Filled 2016-03-04: qty 20

## 2016-03-04 MED ORDER — HEPARIN SOD (PORK) LOCK FLUSH 100 UNIT/ML IV SOLN
INTRAVENOUS | Status: AC
  Filled 2016-03-04: qty 20

## 2016-03-04 MED ORDER — MIDAZOLAM HCL 2 MG/2ML IJ SOLN
INTRAMUSCULAR | Status: AC | PRN
  Administered 2016-03-04: 0.5 mg via INTRAVENOUS
  Administered 2016-03-04: 1 mg via INTRAVENOUS

## 2016-03-04 MED ORDER — LIDOCAINE HCL 1 % IJ SOLN
INTRAMUSCULAR | Status: AC
  Filled 2016-03-04: qty 20

## 2016-03-04 MED ORDER — IOPAMIDOL (ISOVUE-300) INJECTION 61%
INTRAVENOUS | Status: AC
  Administered 2016-03-04: 75 mL
  Filled 2016-03-04: qty 150

## 2016-03-04 MED ORDER — LEVETIRACETAM 750 MG PO TABS: 1500.0000 mg | ORAL_TABLET | Freq: Two times a day (BID) | ORAL | Status: DC

## 2016-03-04 MED ORDER — HEPARIN SOD (PORK) LOCK FLUSH 100 UNIT/ML IV SOLN
INTRAVENOUS | Status: AC | PRN
  Administered 2016-03-04: 1000 [IU] via INTRAVENOUS

## 2016-03-04 MED ORDER — SODIUM CHLORIDE 0.9 % IV SOLN
INTRAVENOUS | Status: AC | PRN
  Administered 2016-03-04: 10 mL/h via INTRAVENOUS

## 2016-03-04 NOTE — Sedation Documentation (Signed)
Patient is resting comfortably. 

## 2016-03-04 NOTE — Progress Notes (Signed)
Pt d/c to home by car with family. Assessment stable. Prescription given. All questions answered 

## 2016-03-04 NOTE — Sedation Documentation (Signed)
Patient denies pain and is resting comfortably.  

## 2016-03-04 NOTE — Sedation Documentation (Signed)
Patient is resting comfortably. Pt reports some discomfort at site, additional medication given as discussed with Dr. Estanislado Pandy

## 2016-03-04 NOTE — Procedures (Signed)
S/P 4 vessel cerebral arteriograms and LT external carotid arteriogram Rt CFA approach. Findings . 1. Rt frontal developmental venous anomaly. 2.Prominent LT lingual and Lt facial arteries  Without AV shunting

## 2016-03-04 NOTE — Discharge Summary (Signed)
Physician Discharge Summary  Nathan Ballard [id number] DOB: Dec 16, 1960 DOA: 02/28/2016  PCP: Jani Gravel, MD  Admit date: 02/28/2016 Discharge date: 03/04/2016  Recommendations for Outpatient Follow-up:  Continue Keppra 1500 mg twice a day Do not drive for 6 months per neurology recommendations  Discharge Diagnoses:  Principal Problem:   Seizure [facility]) Active Problems:   Hypertension   Hyperlipidemia   Anxiety   Arthritis   Abnormal liver function   Spells ([place])   Depression   Essential hypertension   Leukocytosis   Hypomagnesemia   Acute respiratory failure with hypoxia ([place])    Discharge Condition: stable   Diet recommendation: as tolerated   History of present illness:  Per brief narrative 03/03/2016 "55 yo male with h/o HTN, HLD, depression, and anxiety who had 3 episodes of of shaking and LOC over the preceding month. He was admitted after he was found down by son and got CPR. EMS gave narcan with reported resolution of symptoms, however a UDS was negative in the ED."  Hospital Course:   Significant Events: 4/20 admit after found down at home 4/21 unresponsive in hospital room - desaturated - kept pulse - intubated/transferred to ICU - hypotensive  4/22 extubated  Assessment/Plan:  Seizure with post-ictal state - Controlled w/ meds - has been instructed that he can not drive for at least 6 months - Continue Keppra 1500 mg BID on discharge   Sublingual and oral tongue AVM  - Incidentally noted on CT angio head/neck obtained in w/u of Sz - Cerebral angio this am by IR, no AVM's noted   Acute hypoxic and hypercapnic respiratory failure - Intubated briefly with extubation on 4/22  - Felt to be due to seizure activity   Hypotension - Resolved with fluids   Essential hypertension  - Controlled with Bystolic    Dyslipidemia   - Continue Lipitor   Code Status: full code Family Communication: pt and wife at bedside     Consultants: PCCM Neurology   Antibiotics: None  DVT prophylaxis: Lovenox subQ    Signed:  Leisa Lenz, MD  [facility] 03/04/2016, 11:43 AM  Pager #: [phone]  Time spent in minutes: less than 30 minutes   Discharge Exam: Filed Vitals:   03/04/16 1115 03/04/16 1130  BP: 126/71 124/71  Pulse: 51 50  Temp:    Resp: 16 16   Filed Vitals:   03/04/16 1032 03/04/16 1045 03/04/16 1115 03/04/16 1130  BP: 122/78 118/83 126/71 124/71  Pulse: 52 52 51 50  Temp:      TempSrc:      Resp: 16 14 16 16   Height:      Weight:      SpO2: 94% 95% 96% 96%    General: Pt is alert, follows commands appropriately, not in acute distress Cardiovascular: Regular rate and rhythm, S1/S2 +, no murmurs Respiratory: Clear to auscultation bilaterally, no wheezing, no crackles, no rhonchi Abdominal: Soft, non tender, non distended, bowel sounds +, no guarding Extremities: no edema, no cyanosis, pulses palpable bilaterally DP and PT Neuro: Grossly nonfocal  Discharge Instructions  Discharge Instructions    Call MD for:  difficulty breathing, headache or visual disturbances    Complete by:  As directed      Call MD for:  persistant dizziness or light-headedness    Complete by:  As directed      Call MD for:  persistant nausea and vomiting    Complete by:  As directed      Call  MD for:  severe uncontrolled pain    Complete by:  As directed      Diet - low sodium heart healthy    Complete by:  As directed      Discharge instructions    Complete by:  As directed   Continue Keppra 1500 mg twice a day Do not drive for 6 months per neurology recommendations     Increase activity slowly    Complete by:  As directed             Medication List    STOP taking these medications        HYDROcodone-acetaminophen 7.5-325 MG tablet  Commonly known as:  NORCO      TAKE these medications        ALPRAZolam 0.25 MG tablet  Commonly known as:  XANAX  Take 0.25 mg by  mouth 2 (two) times daily as needed for anxiety.     amphetamine-dextroamphetamine 20 MG 24 hr capsule  Commonly known as:  ADDERALL XR  Take 20 mg by mouth daily.     atorvastatin 40 MG tablet  Commonly known as:  LIPITOR  Take 20 mg by mouth daily.     DULoxetine 30 MG capsule  Commonly known as:  CYMBALTA  Take 30 mg by mouth 2 (two) times daily.     levETIRAcetam 750 MG tablet  Commonly known as:  KEPPRA  Take 2 tablets (1,500 mg total) by mouth 2 (two) times daily.     multivitamin tablet  Take 1 tablet by mouth daily.     nebivolol 10 MG tablet  Commonly known as:  BYSTOLIC  Take 10 mg by mouth daily.           Follow-up Information    Follow up with Jani Gravel, MD. Schedule an appointment as soon as possible for a visit in 1 week.   Specialty:  Internal Medicine   Why:  Follow up appt after recent hospitalization   Contact information:   [address] [place] [id number] [phone]        The results of significant diagnostics from this hospitalization (including imaging, microbiology, ancillary and laboratory) are listed below for reference.    Significant Diagnostic Studies: Ct Angio Head W/cm &/or Wo Cm  03/01/2016  CLINICAL DATA:  55 year old male with possible overdose, found unresponsive. Syncope, seizure like activity. Initial encounter. EXAM: CT ANGIOGRAPHY HEAD AND NECK TECHNIQUE: Multidetector CT imaging of the head and neck was performed using the standard protocol during bolus administration of intravenous contrast. Multiplanar CT image reconstructions and MIPs were obtained to evaluate the vascular anatomy. Carotid stenosis measurements (when applicable) are obtained utilizing NASCET criteria, using the distal internal carotid diameter as the denominator. CONTRAST:  50 mL Isovue 370. COMPARISON:  Head CT without contrast 0145 hours today. Brain MRI 02/29/2016, and earlier. FINDINGS: CTA NECK Skeleton:  No acute osseous abnormality  identified. Right paranasal sinus opacification and bubbly opacity. Mastoids are clear. Other neck: Negative visualized lung parenchyma aside from dependent opacity probably reflecting atelectasis. No superior mediastinal lymphadenopathy. Negative thyroid, larynx, pharynx, parapharyngeal spaces, retropharyngeal space, sublingual space (other than left external carotid vascular findings described below), submandibular glands, and parotid glands. No cervical lymphadenopathy. Aortic arch: 3 vessel arch configuration. No arch atherosclerosis. No great vessel origin stenosis. Right carotid system: Minimal soft and calcified plaque at the posterior right ICA origin. No stenosis. Tortuous right ICA just below the skullbase. Left carotid system: Mild mostly calcified plaque  at the left ICA origin. No stenosis. Mildly tortuous left ICA. There is a prominent branch of the left ECA (Takeoff series 501, image 99) which tracks into the left sublingual space and oral tongue (see series 506, images 25 and 26) and is asymmetrically enlarged suggesting a mild vascular malformation. Vertebral arteries:No proximal subclavian artery stenosis, despite some calcified plaque on the right. Normal right vertebral artery origin. Mildly tortuous but otherwise negative right vertebral artery to the skullbase. Normal left vertebral artery origin. Tortuous but otherwise negative left vertebral artery to the skullbase. CTA HEAD Posterior circulation: Codominant distal vertebral arteries. Tortuous but otherwise normal vertebrobasilar junction. PICA origins are patent. No basilar stenosis. Normal SCA and PCA origins. Left posterior communicating artery is present, the right is diminutive or absent. Bilateral PCA branches are within normal limits. Anterior circulation: Both ICA siphons are patent with mild dolichoectasia. No siphon plaque or stenosis. Normal left posterior communicating artery origin. Patent carotid termini. Normal MCA and ACA  origins. Diminutive anterior communicating artery. Bilateral ACA branches are normal aside from mild tortuosity. Left MCA M1 segment is mildly tortuous, but otherwise the M1, bifurcation, and left MCA branches are within normal limits. Right MCA M1 segment, bifurcation, and right MCA branches are within normal limits. Venous sinuses: Patent. Large developmental venous anomaly incidentally noted in the right superior frontal gyrus (normal variant). Anatomic variants: None. Delayed phase: No abnormal enhancement identified. Gray-white matter differentiation remains normal. IMPRESSION: 1. Minimal to mild cervical carotid atherosclerosis with no stenosis. Tortuous bilateral carotid arteries and ICA siphons. 2. 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 Recommend outpatient Neuro-endovascular follow-up. 3. Otherwise negative CTA head and neck. 4. Stable and negative CT appearance of the brain. Right frontal lobe DVA (normal variant). Electronically Signed   By: Genevie Ann M.D.   On: 03/01/2016 11:02   Ct Head Wo Contrast  03/01/2016  CLINICAL DATA:  Seizure activity.  Low blood pressure. EXAM: CT HEAD WITHOUT CONTRAST TECHNIQUE: Contiguous axial images were obtained from the base of the skull through the vertex without intravenous contrast. COMPARISON:  MRI brain 02/29/2016.  CT head 02/28/2016. FINDINGS: Ventricles and sulci appear symmetrical. No ventricular dilatation. Vague focal area of increased attenuation in the right posterior frontal lobe with suggestion of a draining vein consistent with venous malformation. This finding was also demonstrated on previous MRI. No mass effect or midline shift. No abnormal extra-axial fluid collections. Gray-white matter junctions are distinct. Basal cisterns are not effaced. No evidence of acute intracranial hemorrhage. No depressed skull fractures. Mucosal thickening in the paranasal sinuses. Inspissated mucus in  the right maxillary antrum. Mastoid air cells are not opacified. IMPRESSION: No acute intracranial abnormalities. 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 Electronically Signed   By: Lucienne Capers M.D.   On: 03/01/2016 02:35   Ct Head Wo Contrast  02/29/2016  CLINICAL DATA:  Syncope, possible apnea. 3-4 possible seizures. History of hypertension hyperlipidemia. EXAM: CT HEAD WITHOUT CONTRAST TECHNIQUE: Contiguous axial images were obtained from the base of the skull through the vertex without intravenous contrast. COMPARISON:  MRI of the brain November 07, 2007 FINDINGS: INTRACRANIAL CONTENTS: The ventricles and sulci are normal. No intraparenchymal hemorrhage, mass effect nor midline shift. No acute large vascular territory infarcts. RIGHT frontal developmental venous anomaly better demonstrated on prior contrast-enhanced MRI. No abnormal extra-axial fluid collections. Basal cisterns are patent. ORBITS: The included ocular globes and orbital contents are normal. SINUSES: RIGHT  maxillary sinus mucosal thickening with air-fluid level. Mild fronto ethmoidal mucosal thickening. Subcentimeter RIGHT frontal osteoma. Mastoid air cells are well aerated. SKULL/SOFT TISSUES: No skull fracture. No significant soft tissue swelling. IMPRESSION: Negative CT head. Mild acute paranasal sinusitis. Electronically Signed   By: Elon Alas M.D.   On: 02/29/2016 00:02   Ct Angio Neck W/cm &/or Wo/cm  03/01/2016  CLINICAL DATA:  55 year old male with possible overdose, found unresponsive. Syncope, seizure like activity. Initial encounter. EXAM: CT ANGIOGRAPHY HEAD AND NECK TECHNIQUE: Multidetector CT imaging of the head and neck was performed using the standard protocol during bolus administration of intravenous contrast. Multiplanar CT image reconstructions and MIPs were obtained to evaluate the vascular anatomy. Carotid stenosis measurements (when applicable) are obtained utilizing NASCET criteria, using  the distal internal carotid diameter as the denominator. CONTRAST:  50 mL Isovue 370 COMPARISON:  Head CT without contrast 0145 hours today. Brain MRI 02/29/2016, and earlier. FINDINGS: CTA NECK Skeleton: No acute osseous abnormality identified. Right paranasal sinus opacification and bubbly opacity. Mastoids are clear. Other neck: Negative visualized lung parenchyma aside from dependent opacity probably reflecting atelectasis. No superior mediastinal lymphadenopathy. Negative thyroid, larynx, pharynx, parapharyngeal spaces, retropharyngeal space, sublingual space (other than left external carotid vascular findings described below), submandibular glands, and parotid glands. No cervical lymphadenopathy. Aortic arch: 3 vessel arch configuration. No arch atherosclerosis. No great vessel origin stenosis. Right carotid system: Minimal soft and calcified plaque at the posterior right ICA origin. No stenosis. Tortuous right ICA just below the skullbase. Left carotid system: Mild mostly calcified plaque at the left ICA origin. No stenosis. Mildly tortuous left ICA. There is a prominent branch of the left ECA (Takeoff series 501, image 99) which tracks into the left sublingual space and oral tongue (see series 506, images 25 and 26) and is asymmetrically enlarged suggesting a mild vascular malformation. Vertebral arteries:No proximal subclavian artery stenosis, despite some calcified plaque on the right. Normal right vertebral artery origin. Mildly tortuous but otherwise negative right vertebral artery to the skullbase. Normal left vertebral artery origin. Tortuous but otherwise negative left vertebral artery to the skullbase. CTA HEAD Posterior circulation: Codominant distal vertebral arteries. Tortuous but otherwise normal vertebrobasilar junction. PICA origins are patent. No basilar stenosis. Normal SCA and PCA origins. Left posterior communicating artery is present, the right is diminutive or absent. Bilateral PCA  branches are within normal limits. Anterior circulation: Both ICA siphons are patent with mild dolichoectasia. No siphon plaque or stenosis. Normal left posterior communicating artery origin. Patent carotid termini. Normal MCA and ACA origins. Diminutive anterior communicating artery. Bilateral ACA branches are normal aside from mild tortuosity. Left MCA M1 segment is mildly tortuous, but otherwise the M1, bifurcation, and left MCA branches are within normal limits. Right MCA M1 segment, bifurcation, and right MCA branches are within normal limits. Venous sinuses: Patent. Large developmental venous anomaly incidentally noted in the right superior frontal gyrus (normal variant). Anatomic variants: None. Delayed phase: No abnormal enhancement identified. Gray-white matter differentiation remains normal. IMPRESSION: 1. Minimal to mild cervical carotid atherosclerosis with no stenosis. Tortuous bilateral carotid arteries and ICA siphons. 2. 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 Recommend outpatient Neuro-endovascular follow-up. 3. Otherwise negative CTA head and neck. 4. Stable and negative CT appearance of the brain. Right frontal lobe DVA (normal variant). Electronically Signed   By: Genevie Ann M.D.   On: 03/01/2016 11:09   Mr Jeri Cos [id number] Contrast  02/29/2016  CLINICAL DATA:  Possible overdose yesterday evening, found unresponsive on floor. Evaluate seizure like activity. EXAM: MRI HEAD WITHOUT AND WITH CONTRAST TECHNIQUE: Multiplanar, multiecho pulse sequences of the brain and surrounding structures were obtained without and with intravenous contrast. CONTRAST:  15 cc MultiHance COMPARISON:  CT head February 28, 2016 and MRI of the brain November 07, 2007 FINDINGS: The ventricles and sulci are normal for patient's age. No abnormal parenchymal signal, mass lesions, mass effect. No abnormal parenchymal enhancement. Re- demonstration of RIGHT frontal  developmental venous anomaly. No susceptibility artifact is suggests associated cavernoma. A few punctate supratentorial white matter FLAIR T2 hyperintensities are less than expected for age, most compatible with chronic small vessel ischemic disease. No reduced diffusion to suggest acute ischemia. Hippocampi demonstrate symmetric size, morphology and signal. No abnormal extra-axial fluid collections. No extra-axial masses nor leptomeningeal enhancement. Normal major intracranial vascular flow voids seen at the skull base. Ocular globes and orbital contents are unremarkable though not tailored for evaluation. No suspicious calvarial bone marrow signal. No abnormal sellar expansion. Craniocervical junction maintained. RIGHT maxillary sinus air-fluid level. Mild paranasal sinus mucosal thickening. Mastoid air cells are well aerated. IMPRESSION: No acute intracranial process. Stable RIGHT frontal developmental venous anomaly ; otherwise negative MRI of the brain with contrast for age. Electronically Signed   By: Elon Alas M.D.   On: 02/29/2016 02:56   US Abdomen Complete  02/28/2016  CLINICAL DATA:  Abnormal liver function studies, history of hypertension. EXAM: ABDOMEN ULTRASOUND COMPLETE COMPARISON:  None. FINDINGS: Gallbladder: No gallstones or wall thickening visualized. No sonographic Murphy sign noted by sonographer. Common bile duct: Diameter: 3.2 mm Liver: The hepatic echotexture is normal. There is no focal mass or ductal dilation. The surface contour of the liver is normal. IVC: No abnormality visualized. Pancreas: Evaluation of the pancreas was not possible due to excessive bowel gas. Spleen: Size and appearance within normal limits. Right Kidney: Length: 11.8 cm. Echogenicity within normal limits. No mass or hydronephrosis visualized. Left Kidney: Length: 12.4 cm. Echogenicity within normal limits. No mass or hydronephrosis visualized. Abdominal aorta: There is no abdominal aortic aneurysm. Other  findings: There is no ascites. IMPRESSION: 1. Normal abdominal ultrasound examination. No gallstones. If there are clinical concerns of gallbladder dysfunction, a nuclear medicine hepatobiliary scan may be useful. 2. Nonvisualization of the pancreas due to bowel gas. Electronically Signed   By: David  Martinique M.D.   On: 02/28/2016 10:12   Dg Chest Port 1 View  03/01/2016  CLINICAL DATA:  Emergent intubation. EXAM: PORTABLE CHEST 1 VIEW COMPARISON:  09/12/2015 FINDINGS: Endotracheal tube placed with tip measuring 5.3 cm above the carina. Shallow inspiration with atelectasis in the lung bases. Possible focal consolidation in the left lung base behind the heart. No pneumothorax. No blunting of costophrenic angles. IMPRESSION: Endotracheal tube with tip measuring 5.3 cm above the carina. Shallow inspiration with atelectasis in the lung bases and possible consolidation in the left lung base. Electronically Signed   By: Lucienne Capers M.D.   On: 03/01/2016 01:51    Microbiology: Recent Results (from the past 240 hour(s))  MRSA PCR Screening     Status: None   Collection Time: 03/01/16  1:20 AM  Result Value Ref Range Status   MRSA by PCR NEGATIVE NEGATIVE Final    Comment:        The GeneXpert MRSA Assay (FDA approved for NASAL specimens only), is one component of a comprehensive MRSA colonization surveillance program. It is not intended to diagnose  MRSA infection nor to guide or monitor treatment for MRSA infections.      Labs: Basic Metabolic Panel:  Recent Labs Lab 02/28/16 2348 02/29/16 0109 03/01/16 0055 03/01/16 0923 03/02/16 0448 03/03/16 0240 03/04/16 0448  NA 142 141 138 143  --  141 140  K 4.7 3.8 3.3* 4.2  --  4.0 3.8  CL 105 105 103 108  --  105 104  CO2 27 24 22 27   --  25 29  GLUCOSE 138* 91 309* 89  --  102* 103*  BUN 17 16 13 12   --  9 10  CREATININE 1.11 0.96 1.34* 1.04  --  0.88 0.97  CALCIUM 8.8* 8.7* 8.1* 7.9*  --  8.4* 8.3*  MG 2.1  --  2.1  --  1.8 1.9  --    PHOS  --   --  7.1*  --  3.4 4.4  --    Liver Function Tests:  Recent Labs Lab 02/28/16 2348 02/29/16 0109 03/04/16 0448  AST 93* 83* 24  ALT 104* 99* 44  ALKPHOS 50 49 48  BILITOT 0.5 0.7 0.6  PROT 5.9* 6.0* 5.3*  ALBUMIN 3.8 3.9 3.1*   No results for input(s): LIPASE, AMYLASE in the last 168 hours.  Recent Labs Lab 03/01/16 1833  AMMONIA 22   CBC:  Recent Labs Lab 02/28/16 2348 02/29/16 0109 03/01/16 0923 03/03/16 0240  WBC 12.8* 11.1* 6.7 5.8  NEUTROABS 10.6*  --   --   --   HGB 12.6* 12.3* 11.0* 11.7*  HCT 37.8* 37.0* 33.7* 34.7*  MCV 95.2 94.4 96.3 94.0  PLT 214 212 178 188   Cardiac Enzymes:  Recent Labs Lab 03/01/16 0055  TROPONINI <0.03   BNP: BNP (last 3 results) No results for input(s): BNP in the last 8760 hours.  ProBNP (last 3 results) No results for input(s): PROBNP in the last 8760 hours.  CBG:  Recent Labs Lab 02/29/16 0759 03/01/16 0045 03/01/16 0819 03/02/16 0802 03/03/16 0753  GLUCAP 73 245* 76 80 88

## 2016-03-04 NOTE — Discharge Instructions (Addendum)
Levetiracetam extended-release tablets What is this medicine? LEVETIRACETAM (lee ve tye RA se tam) is an antiepileptic drug. It is used with other medicines to treat certain types of seizures. This medicine may be used for other purposes; ask your health care provider or pharmacist if you have questions. What should I tell my health care provider before I take this medicine? They need to know if you have any of these conditions: -kidney disease -suicidal thoughts, plans, or attempt; a previous suicide attempt by you or a family member -an unusual or allergic reaction to levetiracetam, other medicines, foods, dyes, or preservatives -pregnant or trying to get pregnant -breast-feeding How should I use this medicine? Take this medicine by mouth with a glass of water. Follow the directions on the prescription label. Do not cut, crush or chew this medicine. You may take this medicine with or without food. Take your doses at regular intervals. Do not take your medicine more often than directed. Do not stop taking this medicine or any of your seizure medicines unless instructed by your doctor or health care professional. Stopping your medicine suddenly can increase your seizures or their severity. A special MedGuide will be given to you by the pharmacist with each prescription and refill. Be sure to read this information carefully each time. Contact your pediatrician or health care professional regarding the use of this medication in children. While this drug may be prescribed for children as young as 33 years of age for selected conditions, precautions do apply. Overdosage: If you think you have taken too much of this medicine contact a poison control center or emergency room at once. NOTE: This medicine is only for you. Do not share this medicine with others. What if I miss a dose? If you miss a dose and it has only been a few hours, take it as soon as you can. If it is almost time for your next dose,  take only that dose. Do not take double or extra doses. What may interact with this medicine? This medicine may interact with the following medications: -carbamazepine -colesevelam -probenecid -sevelamer This list may not describe all possible interactions. Give your health care provider a list of all the medicines, herbs, non-prescription drugs, or dietary supplements you use. Also tell them if you smoke, drink alcohol, or use illegal drugs. Some items may interact with your medicine. What should I watch for while using this medicine? Visit your doctor or health care professional for a regular check on your progress. Wear a medical identification bracelet or chain to say you have epilepsy, and carry a card that lists all your medications. It is important to take this medicine exactly as instructed by your health care professional. When first starting treatment, your dose may need to be adjusted. It may take weeks or months before your dose is stable. You should contact your doctor or health care professional if your seizures get worse or if you have any new types of seizures. You may get drowsy or dizzy. Do not drive, use machinery, or do anything that needs mental alertness until you know how this medicine affects you. Do not stand or sit up quickly, especially if you are an older patient. This reduces the risk of dizzy or fainting spells. Alcohol may interfere with the effect of this medicine. Avoid alcoholic drinks. The use of this medicine may increase the chance of suicidal thoughts or actions. Pay special attention to how you are responding while on this medicine. Any worsening of mood,  or thoughts of suicide or dying should be reported to your health care professional right away. The tablet shell for some brands of this medicine does not dissolve. This is normal. The tablet shell may appear in the stool. This is not cause for concern. Women who become pregnant while using this medicine may  enroll in the [place] Pregnancy Registry by calling [phone]. This registry collects information about the safety of antiepileptic drug use during pregnancy. What side effects may I notice from receiving this medicine? Side effects you should report to your doctor or health care professional as soon as possible: -allergic reactions like skin rash, itching or hives, swelling of the face, lips, or tongue -breathing problems -changes in emotions or moods -dark urine -general ill feeling or flu-like symptoms -problems with balance, talking, walking -suicidal thoughts or actions -unusually weak or tired -yellowing of the eyes or skin Side effects that usually do not require medical attention (report to your doctor or health care professional if they continue or are bothersome): -diarrhea -dizzy, drowsy -headache -loss of appetite This list may not describe all possible side effects. Call your doctor for medical advice about side effects. You may report side effects to FDA at 1-800-FDA-1088. Where should I keep my medicine? Keep out of reach of children. Store at room temperature between 15 and 30 degrees C (59 and 86 degrees F). Throw away any unused medicine after the expiration date. NOTE: This sheet is a summary. It may not cover all possible information. If you have questions about this medicine, talk to your doctor, pharmacist, or health care provider.    2016, Elsevier/Gold Standard. (2015-02-19 10:13:38) Nonepileptic Seizures Nonepileptic seizures are seizures that are not caused by abnormal electrical signals in your brain. These seizures often seem like epileptic seizures, but they are not caused by epilepsy.  There are two types of nonepileptic seizures:  A physiologic nonepileptic seizure results from a disruption in your brain.  A psychogenic seizure results from emotional stress. These seizures are sometimes called pseudoseizures. CAUSES    Causes of physiologic nonepileptic seizures include:   Sudden drop in blood pressure.  Low blood sugar.  Low levels of salt (sodium) in your blood.  Low levels of calcium in your blood.  Migraine.  Heart rhythm problems.  Sleep disorders.  Drug and alcohol abuse. Common causes of psychogenic nonepileptic seizures include:  Stress.  Emotional trauma.  Sexual or physical abuse.  Major life events, such as divorce or the death of a loved one.  Mental health disorders, including panic attack and hyperactivity disorder. SIGNS AND SYMPTOMS A nonepileptic seizure can look like an epileptic seizure, including uncontrollable shaking (convulsions), or changes in attention, behavior, or the ability to remain awake and alert. However, there are some differences. Nonepileptic seizures usually:  Do not cause physical injuries.  Start slowly.  Include crying or shrieking.  Last longer than 2 minutes.  Have a short recovery time without headache or exhaustion. DIAGNOSIS  Your health care provider can usually diagnose nonepileptic seizures after taking your medical history and giving you a physical exam. Your health care provider may want to talk to your friends or relatives who have seen you have a seizure.  You may also need to have tests to look for causes of physiologic nonepileptic seizures. This may include an electroencephalogram (EEG), which is a test that measures electrical activity in your brain. If you have had an epileptic seizure, the results of your EEG will be abnormal.  If your health care provider thinks you have had a psychogenic nonepileptic seizure, you may need to see a mental health specialist for an evaluation. TREATMENT  Treatment depends on the type and cause of your seizures.  For physiologic nonepileptic seizures, treatment is aimed at addressing the underlying condition that caused the seizures. These seizures usually stop when the underlying condition is  properly treated.  Nonepileptic seizures do not respond to the seizure medicines used to treat epilepsy.  For psychogenic seizures, you may need to work with a mental health specialist. [place] care will depend on the type of nonepileptic seizures you have.   Follow all your health care provider's instructions.  Keep all your follow-up appointments. SEEK MEDICAL CARE IF: You continue to have seizures after treatment. SEEK IMMEDIATE MEDICAL CARE IF:  Your seizures change or become more frequent.  You injure yourself during a seizure.  You have one seizure after another.  You have trouble recovering from a seizure.  You have chest pain or trouble breathing. MAKE SURE YOU:  Understand these instructions.  Will watch your condition.  Will get help right away if you are not doing well or get worse.   This information is not intended to replace advice given to you by your health care provider. Make sure you discuss any questions you have with your health care provider.   Document Released: 12/12/2005 Document Revised: 11/17/2014 Document Reviewed: 08/23/2013 Elsevier Interactive Patient Education [employer].

## 2016-03-04 NOTE — Sedation Documentation (Signed)
Co2 removed per Dr. Estanislado Pandy request

## 2016-03-06 ENCOUNTER — Ambulatory Visit (INDEPENDENT_AMBULATORY_CARE_PROVIDER_SITE_OTHER): Payer: Managed Care, Other (non HMO) | Admitting: Neurology

## 2016-03-06 ENCOUNTER — Encounter: Payer: Self-pay | Admitting: Neurology

## 2016-03-06 VITALS — BP 116/73 | HR 58 | Ht 72.0 in | Wt 203.8 lb

## 2016-03-06 DIAGNOSIS — D1802 Hemangioma of intracranial structures: Secondary | ICD-10-CM | POA: Diagnosis not present

## 2016-03-06 DIAGNOSIS — G40209 Localization-related (focal) (partial) symptomatic epilepsy and epileptic syndromes with complex partial seizures, not intractable, without status epilepticus: Secondary | ICD-10-CM | POA: Insufficient documentation

## 2016-03-06 HISTORY — DX: Localization-related (focal) (partial) symptomatic epilepsy and epileptic syndromes with complex partial seizures, not intractable, without status epilepticus: G40.209

## 2016-03-06 NOTE — Progress Notes (Signed)
[facility] [address] [place]. [place] [address] [phone]       OFFICE CONSULT NOTE  Mr. Nathan Ballard Date of Birth:  [birth year] Medical Record Number:  [id number]   Referring MD:  Leisa Lenz Reason for Referral:  Seizures HPI: 55 year old Caucasian male who is referred from [facility] for seizures. Patient is a complaint today by his wife who provides most of the history. The patient has had multiple at least 5 episodes of brief altered consciousness followed by some confusion and disorientation. The first episode was on March 18 when the patient was asleep and making loud noises. His son who had been studying late that night tried to wake him up and could not awaken him. EMS was called and patient taken to the emergency room where he was thought to have taken extra tablets of narcotic pain medication but his urine drug screen was negative. He was given Narcan. He gradually woke up but was confused and disoriented for a while. Next episode on March 24 which was similar though little bit shorter. He was again given Narcan did not improve. He recovered quickly and did not go to the hospital. But EMS were called. Third episode occurred on April 13 and the patient was at home was standing near the sternum and wife who is alive against prescribing a staring with leg trembling and then he became unresponsive. He was admitted to [facility] and thought to have his seizure and was started on Keppra 500 twice daily. 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 Patient wasn't discharged next day but early morning developed episode of again brief unresponsiveness with hypoxia difficulty breathing he was intubated and moved to the intensive care unit. He was thought to have  minor another seizure-like episode. He had initial EEG performed on 02/29/16 which was normal. Subsequently had overnight EEG with video recording performed on 03/01/2016 hat was negative for any epileptiform activity. His Keppra dose was increased to 1500 mg twice daily. Patient states his done well since going home. He is tolerating the current dose of Keppra without dizziness, drowsiness and personality change. On inquiry states that he had somewhat similar episode 10 years ago for which he saw Dr. love. In brief staring followed by behavioral change. Extensive workup at that time was negative but he was not started on seizure medicines. Patient has also been investigated in the past for sleep apnea but polysomnogram showing only borderline sleep apnea and is not on treatment. He has no history of seizures in childhood, significant head injury with loss of consciousness, meningitis, encephalitis. There is no family history of epilepsy.  ROS:   14 system review of systems is positive for  ringing in the ears, seizure, loss of consciousness, confusion, and his presentation, cough and all other systems negative  PMH:  Past Medical History  Diagnosis Date  . Hypertension   . Hyperlipidemia   . Anxiety   . Arthritis   . Seizures ([place])     Social History:  Social History   Social History  . Marital Status: Married    Spouse Name: N/A  . Number of Children: N/A  . Years of Education: N/A   Occupational History  . Not on file.   Social History Main Topics  . Smoking status:  Never Smoker   . Smokeless tobacco: Never Used  . Alcohol Use: No  . Drug Use: No  . Sexual Activity: Not on file   Other Topics Concern  . Not on file   Social History Narrative    Medications:   Current Outpatient Prescriptions on File Prior to Visit  Medication Sig Dispense Refill  . ALPRAZolam (XANAX) 0.25 MG tablet Take 0.25 mg by mouth 2 (two) times daily as needed for anxiety.    Marland Kitchen  amphetamine-dextroamphetamine (ADDERALL XR) 20 MG 24 hr capsule Take 20 mg by mouth daily.    Marland Kitchen atorvastatin (LIPITOR) 40 MG tablet Take 20 mg by mouth daily.     . DULoxetine (CYMBALTA) 30 MG capsule Take 30 mg by mouth 2 (two) times daily.    Marland Kitchen levETIRAcetam (KEPPRA) 750 MG tablet Take 2 tablets (1,500 mg total) by mouth 2 (two) times daily. 60 tablet 0  . Multiple Vitamin (MULTIVITAMIN) tablet Take 1 tablet by mouth daily.    . nebivolol (BYSTOLIC) 10 MG tablet Take 10 mg by mouth daily.     No current facility-administered medications on file prior to visit.    Allergies:   Allergies  Allergen Reactions  . Morphine And Related Nausea And Vomiting    Physical Exam General: well developed, well nourished middle-age Caucasian male, seated, in no evident distress Head: head normocephalic and atraumatic.   Neck: supple with no carotid or supraclavicular bruits Cardiovascular: regular rate and rhythm, no murmurs Musculoskeletal: no deformity Skin:  no rash/petichiae Vascular:  Normal pulses all extremities  Neurologic Exam Mental Status: Awake and fully alert. Oriented to place and time. Recent and remote memory intact. Attention span, concentration and fund of knowledge appropriate. Mood and affect appropriate. Diminished recall 1/3. Animal naming 15. Cranial Nerves: Fundoscopic exam reveals sharp disc margins. Pupils equal, briskly reactive to light. Extraocular movements full without nystagmus. Visual fields full to confrontation. Hearing intact. Facial sensation intact. Face, tongue, palate moves normally and symmetrically.  Motor: Normal bulk and tone. Normal strength in all tested extremity muscles. Sensory.: intact to touch , pinprick , position and vibratory sensation.  Coordination: Rapid alternating movements normal in all extremities. Finger-to-nose and heel-to-shin performed accurately bilaterally. Gait and Station: Arises from chair without difficulty. Stance is normal. Gait  demonstrates normal stride length and balance . Able to heel, toe and tandem walk without difficulty.  Reflexes: 1+ and symmetric. Toes downgoing.       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    PLAN: I had a long discussion with the patient and his wife regarding his multiple recent episodes of brief loss of consciousness likely representing partial complex seizures. I also personally reviewed the brain imaging studies, EEG and hospital evaluation and answered questions. I agree with continuing Keppra in the current dose of 1500 mg twice daily since is tolerating it well. I advised the patient to avoid seizure provoking stimuli like irregular sleeping or eating habits, alcohol, stimulants. He was advised not to drive for the next 3-6 months as per [facility]. He may return to work. Greater than 50% time during this 45 minute consultation was spent on counseling and coordination of care about these episodes, seizures and prevention .He was advised to follow-up with me in 3 months or call earlier  if necessary. Antony Contras, MD  [facility] [address] [place], [place] [address]  Phone [phone] Fax [phone]   Note: This document was prepared with digital dictation and possible smart phrase technology. Any transcriptional errors that result from this process are unintentional.

## 2016-03-06 NOTE — Patient Instructions (Signed)
I had a long discussion with the patient and his wife regarding his multiple recent episodes of brief loss of consciousness likely representing partial complex seizures. I also personally reviewed the brain imaging studies, EEG and hospital evaluation and answered questions. I agree with continuing Keppra and the current dose of 1500 mg twice daily since is tolerating it well. I advised the patient to avoid seizure provoking stimuli like irregular sleeping or eating habits, alcohol, stimulants. He was advised not to drive for the next 3-6 months as per [facility]. He may return to work. He was advised to follow-up with me in 3 months or call earlier if necessary. Epilepsy People with epilepsy have times when they shake and jerk uncontrollably (seizures). This happens when there is a sudden change in brain function. Epilepsy may have many possible causes. Anything that disturbs the normal pattern of brain cell activity can lead to seizures. HOME CARE   Follow your doctor's instructions about driving and safety during normal activities.  Get enough sleep.  Only take medicine as told by your doctor.  Avoid things that you know can cause you to have seizures (triggers).  Write down when your seizures happen and what you remember about each seizure. Write down anything you think may have caused the seizure to happen.  Tell the people you live and work with that you have seizures. Make sure they know how to help you. They should:  Cushion your head and body.  Turn you on your side.  Not restrain you.  Not place anything inside your mouth.  Call for local emergency medical help if there is any question about what has happened.  Keep all follow-up visits with your doctor. This is very important. GET HELP IF:  You get an infection or start to feel sick. You may have more seizures when you are sick.  You are having seizures more often.  Your seizure pattern is changing. GET HELP  RIGHT AWAY IF:   A seizure does not stop after a few seconds or minutes.  A seizure causes you to have trouble breathing.  A seizure gives you a very bad headache.  A seizure makes you unable to speak or use a part of your body.   This information is not intended to replace advice given to you by your health care provider. Make sure you discuss any questions you have with your health care provider.   Document Released: 08/24/2009 Document Revised: 08/17/2013 Document Reviewed: 06/08/2013 Elsevier Interactive Patient Education [employer].

## 2016-03-22 ENCOUNTER — Emergency Department (HOSPITAL_COMMUNITY)
Admission: EM | Admit: 2016-03-22 | Discharge: 2016-03-23 | Disposition: A | Discharge status: Home / Self Care | Payer: Managed Care, Other (non HMO) | Attending: Emergency Medicine | Admitting: Emergency Medicine

## 2016-03-22 ENCOUNTER — Encounter (HOSPITAL_COMMUNITY): Payer: Self-pay | Admitting: Nurse Practitioner

## 2016-03-22 ENCOUNTER — Encounter (HOSPITAL_COMMUNITY): Payer: Self-pay

## 2016-03-22 ENCOUNTER — Emergency Department (HOSPITAL_COMMUNITY)
Admission: EM | Admit: 2016-03-22 | Discharge: 2016-03-22 | Disposition: A | Discharge status: Home / Self Care | Payer: Managed Care, Other (non HMO) | Source: Home / Self Care | Attending: Emergency Medicine | Admitting: Emergency Medicine

## 2016-03-22 DIAGNOSIS — G40209 Localization-related (focal) (partial) symptomatic epilepsy and epileptic syndromes with complex partial seizures, not intractable, without status epilepticus: Secondary | ICD-10-CM | POA: Insufficient documentation

## 2016-03-22 DIAGNOSIS — E785 Hyperlipidemia, unspecified: Secondary | ICD-10-CM | POA: Insufficient documentation

## 2016-03-22 DIAGNOSIS — R51 Headache: Secondary | ICD-10-CM | POA: Insufficient documentation

## 2016-03-22 DIAGNOSIS — Z8739 Personal history of other diseases of the musculoskeletal system and connective tissue: Secondary | ICD-10-CM

## 2016-03-22 DIAGNOSIS — R569 Unspecified convulsions: Secondary | ICD-10-CM

## 2016-03-22 DIAGNOSIS — R4789 Other speech disturbances: Secondary | ICD-10-CM | POA: Insufficient documentation

## 2016-03-22 DIAGNOSIS — I10 Essential (primary) hypertension: Secondary | ICD-10-CM | POA: Insufficient documentation

## 2016-03-22 DIAGNOSIS — R251 Tremor, unspecified: Secondary | ICD-10-CM | POA: Insufficient documentation

## 2016-03-22 DIAGNOSIS — F419 Anxiety disorder, unspecified: Secondary | ICD-10-CM

## 2016-03-22 DIAGNOSIS — Z79899 Other long term (current) drug therapy: Secondary | ICD-10-CM

## 2016-03-22 DIAGNOSIS — F1310 Sedative, hypnotic or anxiolytic abuse, uncomplicated: Secondary | ICD-10-CM | POA: Insufficient documentation

## 2016-03-22 LAB — CBC WITH DIFFERENTIAL/PLATELET
BASOS PCT: 1 %
Basophils Absolute: 0 10*3/uL (ref 0.0–0.1)
EOS ABS: 0.1 10*3/uL (ref 0.0–0.7)
EOS PCT: 2 %
HCT: 37.7 % — ABNORMAL LOW (ref 39.0–52.0)
Hemoglobin: 12.4 g/dL — ABNORMAL LOW (ref 13.0–17.0)
Lymphocytes Relative: 43 %
Lymphs Abs: 2 10*3/uL (ref 0.7–4.0)
MCH: 31.9 pg (ref 26.0–34.0)
MCHC: 32.9 g/dL (ref 30.0–36.0)
MCV: 96.9 fL (ref 78.0–100.0)
MONO ABS: 0.4 10*3/uL (ref 0.1–1.0)
MONOS PCT: 9 %
NEUTROS PCT: 45 %
Neutro Abs: 2 10*3/uL (ref 1.7–7.7)
PLATELETS: 186 10*3/uL (ref 150–400)
RBC: 3.89 MIL/uL — ABNORMAL LOW (ref 4.22–5.81)
RDW: 12.5 % (ref 11.5–15.5)
WBC: 4.5 10*3/uL (ref 4.0–10.5)

## 2016-03-22 LAB — CBG MONITORING, ED
GLUCOSE-CAPILLARY: 82 mg/dL (ref 65–99)
Glucose-Capillary: 120 mg/dL — ABNORMAL HIGH (ref 65–99)

## 2016-03-22 MED ORDER — VALPROATE SODIUM 500 MG/5ML IV SOLN: 1000.0000 mg | Freq: Once | INTRAVENOUS | Status: DC

## 2016-03-22 MED ORDER — DIAZEPAM 20 MG RE GEL: 20.0000 mg | Freq: Once | RECTAL | Status: DC

## 2016-03-22 MED ORDER — BUTALBITAL-APAP-CAFFEINE 50-325-40 MG PO TABS
1.0000 | ORAL_TABLET | Freq: Once | ORAL | Status: AC
  Administered 2016-03-22: 1 via ORAL
  Filled 2016-03-22: qty 1

## 2016-03-22 MED ORDER — DIVALPROEX SODIUM 250 MG PO DR TAB: 750.0000 mg | DELAYED_RELEASE_TABLET | Freq: Three times a day (TID) | ORAL | Status: DC

## 2016-03-22 MED ORDER — VALPROATE SODIUM 500 MG/5ML IV SOLN
1000.0000 mg | Freq: Once | INTRAVENOUS | Status: AC
  Administered 2016-03-23: 1000 mg via INTRAVENOUS
  Filled 2016-03-22: qty 10

## 2016-03-22 MED ORDER — DIVALPROEX SODIUM 250 MG PO DR TAB
1500.0000 mg | DELAYED_RELEASE_TABLET | Freq: Once | ORAL | Status: AC
  Administered 2016-03-22: 1500 mg via ORAL
  Filled 2016-03-22: qty 6

## 2016-03-22 NOTE — ED Notes (Signed)
Wife reports pt had approximately 35 minute episode of shaking decreased O2 sats, what appeared to be one of his seizures. he's had a recent admission here for seizures and they are not sure why he is having the seizures yet. He is alert now and breathing easily. He is taking keppra as prescribed.

## 2016-03-22 NOTE — Discharge Instructions (Signed)

## 2016-03-22 NOTE — ED Provider Notes (Signed)
CSN: [id number]     Arrival date & time 03/22/16  1435 History   First MD Initiated Contact with Patient 03/22/16 1521     Chief Complaint  Patient presents with  . Seizures     (Consider location/radiation/quality/duration/timing/severity/associated sxs/prior Treatment) Patient is a 55 y.o. male presenting with seizures. The history is provided by the patient.  Seizures Seizure activity on arrival: no   Seizure type:  Partial simple Preceding symptoms: aura   Initial focality:  None Episode characteristics: abnormal movements, confusion, disorientation and eye deviation   Postictal symptoms: confusion   Return to baseline: yes   Severity:  Moderate Duration:  30 minutes Timing:  Once Number of seizures this episode:  2 Progression:  Unchanged Recent head injury:  No recent head injuries PTA treatment:  None History of seizures: yes    55 yo M With a chief complaints of seizures. Patient was recently seen 2 weeks ago and admitted for the same. Patient with no history prior to that event. At that time had to be intubated due to apnea. Patient had one episode last night and then 1 today. Family states that he has been off and on having partial seizures where he is able to eventually come to without sequela. However had 2 episodes where they felt like he was not breathing and took some time to come around. The one today lasted for about 30 minutes. Staring off into the distance and localize shaking eyes rolled back of his head. I think eventually he responded to loud noises. After which he was confused and this lasted for another 20 minutes or so. He is currently on 1500 mg of Keppra twice a day. Has been taking this religiously and keeping a log of the exact time in which he takes it. Denies any loss of sleep or any other change which may attribute to the seizures. As last visit he had an MRI as well as 2 negative EEGs.  Past Medical History  Diagnosis Date  . Hypertension   .  Hyperlipidemia   . Anxiety   . Arthritis   . Seizures [facility])    Past Surgical History  Procedure Laterality Date  . Anterior cruciate ligament repair  1989, 2007    left  . Shoulder surgery  1999    right  . Ganglion cyst excision  1998    right wrist  . Removal toe nail  1993    right great toe  . Knee arthroscopy with medial menisectomy Right 09/19/2015    Procedure: RIGHT KNEE ARTHROSCOPY WITH PARTIAL MEDIAL MENISCECTOMY;  Surgeon: Leandrew Koyanagi, MD;  Location: [place];  Service: Orthopedics;  Laterality: Right;   Family History  Problem Relation Age of Onset  . Colon cancer Neg Hx   . Stomach cancer Neg Hx   . Diabetes Father   . Heart disease Father   . Hypertension Father   . Alzheimer's disease Mother    Social History  Substance Use Topics  . Smoking status: Never Smoker   . Smokeless tobacco: Never Used  . Alcohol Use: No    Review of Systems  Constitutional: Negative for fever and chills.  HENT: Negative for congestion and facial swelling.   Eyes: Negative for discharge and visual disturbance.  Respiratory: Negative for shortness of breath.   Cardiovascular: Negative for chest pain and palpitations.  Gastrointestinal: Negative for vomiting, abdominal pain and diarrhea.  Musculoskeletal: Negative for myalgias and arthralgias.  Skin: Negative for color change  and rash.  Neurological: Positive for tremors, seizures, speech difficulty and headaches. Negative for syncope.  Psychiatric/Behavioral: Negative for confusion and dysphoric mood.      Allergies  Morphine and related  Home Medications   Prior to Admission medications   Medication Sig Start Date End Date Taking? Authorizing Provider  ALPRAZolam (XANAX) 0.25 MG tablet Take 0.25 mg by mouth 2 (two) times daily as needed for anxiety.    Historical Provider, MD  amphetamine-dextroamphetamine (ADDERALL XR) 20 MG 24 hr capsule Take 20 mg by mouth daily. 01/30/16   Historical Provider, MD   atorvastatin (LIPITOR) 40 MG tablet Take 20 mg by mouth daily.  08/18/12   Historical Provider, MD  diazepam (DIASAT) 20 MG GEL Place 20 mg rectally once. For breathing trouble or seizure lasting >5 min. 03/22/16   Deno Etienne, DO  divalproex (DEPAKOTE) 250 MG DR tablet Take 3 tablets (750 mg total) by mouth 3 (three) times daily. 03/22/16   Deno Etienne, DO  DULoxetine (CYMBALTA) 30 MG capsule Take 30 mg by mouth 2 (two) times daily. 02/22/16   Historical Provider, MD  levETIRAcetam (KEPPRA) 750 MG tablet Take 2 tablets (1,500 mg total) by mouth 2 (two) times daily. 03/04/16   Robbie Lis, MD  Multiple Vitamin (MULTIVITAMIN) tablet Take 1 tablet by mouth daily.    Historical Provider, MD  nebivolol (BYSTOLIC) 10 MG tablet Take 10 mg by mouth daily.    Historical Provider, MD   BP 122/80 mmHg  Pulse 54  Temp(Src) 98.5 F (36.9 C) (Oral)  Resp 12  SpO2 96% Physical Exam  Constitutional: He is oriented to person, place, and time. He appears well-developed and well-nourished.  HENT:  Head: Normocephalic and atraumatic.  Eyes: EOM are normal. Pupils are equal, round, and reactive to light.  Neck: Normal range of motion. Neck supple. No JVD present.  Cardiovascular: Normal rate and regular rhythm.  Exam reveals no gallop and no friction rub.   No murmur heard. Pulmonary/Chest: No respiratory distress. He has no wheezes.  Abdominal: He exhibits no distension. There is no rebound and no guarding.  Musculoskeletal: Normal range of motion.  Neurological: He is alert and oriented to person, place, and time. He has normal strength. No cranial nerve deficit or sensory deficit. He displays a negative Romberg sign. Coordination and gait normal. GCS eye subscore is 4. GCS verbal subscore is 5. GCS motor subscore is 6. He displays no Babinski's sign on the right side. He displays no Babinski's sign on the left side.  Reflex Scores:      Tricep reflexes are 2+ on the right side and 2+ on the left side.       Bicep reflexes are 2+ on the right side and 2+ on the left side.      Brachioradialis reflexes are 2+ on the right side and 2+ on the left side.      Patellar reflexes are 2+ on the right side and 2+ on the left side.      Achilles reflexes are 2+ on the right side and 2+ on the left side. Benign neuro exam  Skin: No rash noted. No pallor.  Psychiatric: He has a normal mood and affect. His behavior is normal.  Nursing note and vitals reviewed.   ED Course  Procedures (including critical care time) Labs Review Labs Reviewed  CBG MONITORING, ED - Abnormal; Notable for the following:    Glucose-Capillary 120 (*)    All other components within normal limits  PARATHYROID HORMONE, INTACT (NO CA)    Imaging Review No results found. I have personally reviewed and evaluated these images and lab results as part of my medical decision-making.   EKG Interpretation   Date/Time:  Saturday Mar 22 2016 14:39:49 EDT Ventricular Rate:  77 PR Interval:  152 QRS Duration: 86 QT Interval:  352 QTC Calculation: 398 R Axis:   31 Text Interpretation:  Normal sinus rhythm Normal ECG No significant change  since last tracing Confirmed by Evangelos Paulino MD, Quillian Quince [id number]) on 03/22/2016  3:21:59 PM      MDM   Final diagnoses:  Seizure ([place])    55 yo M with a chief complaint of seizures. Having them even though he is on his Keppra dose. Currently back to baseline. Benign neuro exam. Prior visits with low calcium will obtain a PTH. Discussed with neurology. Dr. Leonel Ramsay, recommended starting Depakote as well. Will follow-up with his neurologist.  6:51 PM:  I have discussed the diagnosis/risks/treatment options with the patient and family and believe the pt to be eligible for discharge home to follow-up with Neuro. We also discussed returning to the ED immediately if new or worsening sx occur. We discussed the sx which are most concerning (e.g., repeat event, focal weakness) that necessitate immediate  return. Medications administered to the patient during their visit and any new prescriptions provided to the patient are listed below.  Medications given during this visit Medications  butalbital-acetaminophen-caffeine (FIORICET, ESGIC) 50-325-40 MG per tablet 1 tablet (1 tablet Oral Given 03/22/16 1601)  divalproex (DEPAKOTE) DR tablet 1,500 mg (1,500 mg Oral Given 03/22/16 1640)    Discharge Medication List as of 03/22/2016  4:34 PM    START taking these medications   Details  diazepam (DIASAT) 20 MG GEL Place 20 mg rectally once. For breathing trouble or seizure lasting >5 min., Starting 03/22/2016, Print    divalproex (DEPAKOTE) 250 MG DR tablet Take 3 tablets (750 mg total) by mouth 3 (three) times daily., Starting 03/22/2016, Until Discontinued, Print        The patient appears reasonably screen and/or stabilized for discharge and I doubt any other medical condition or other [facility] requiring further screening, evaluation, or treatment in the ED at this time prior to discharge.      Deno Etienne, DO 03/22/16 1851

## 2016-03-22 NOTE — ED Provider Notes (Signed)
CSN: [id number]     Arrival date & time 03/22/16  2208 History   First MD Initiated Contact with Patient 03/22/16 2232     Chief Complaint  Patient presents with  . Seizures     (Consider location/radiation/quality/duration/timing/severity/associated sxs/prior Treatment) HPI Patient is a 55 year old male with past medical history of seizures who presents with a partial simple seizure. Seizure occurred just prior to arrival patient was getting ready to eat dinner. His wife found him standing in the kitchen staring and disoriented. He subsequently has 20-30 minutes of confusion where he was giving inappropriate answers to questions. This presentation is consistent with his prior seizures. Wife drove patient to the nearby fire station where his vitals were taken and he was found with O2sats 86% on room air. Subsequently returned to the ED for evaluation. He was not cyanotic or having increased work of breathing. He is currently slightly sleepy, but has otherwise returned to baseline. Denies any recent head injuries, lack of sleep or any new infectious symptoms.  Of note, he was recently seen in the hospital this afternoon for similar symptoms. He was started on by mouth Depakote after consultation with neurology. However it does not appear that he was loaded with any IV medicines at that time. Patient reports a history of similar seizures 10 years ago that have recurred starting about 2 weeks ago. All at that time also patient was admitted to hospital. Patient was reportedly apneic at that time. He was started on Keppra which has been increased to 1500 twice daily. Patient reports he has not missed any doses of Keppra.  Past Medical History  Diagnosis Date  . Hypertension   . Hyperlipidemia   . Anxiety   . Arthritis   . Seizures [facility])    Past Surgical History  Procedure Laterality Date  . Anterior cruciate ligament repair  1989, 2007    left  . Shoulder surgery  1999    right  . Ganglion  cyst excision  1998    right wrist  . Removal toe nail  1993    right great toe  . Knee arthroscopy with medial menisectomy Right 09/19/2015    Procedure: RIGHT KNEE ARTHROSCOPY WITH PARTIAL MEDIAL MENISCECTOMY;  Surgeon: Leandrew Koyanagi, MD;  Location: [place];  Service: Orthopedics;  Laterality: Right;   Family History  Problem Relation Age of Onset  . Colon cancer Neg Hx   . Stomach cancer Neg Hx   . Diabetes Father   . Heart disease Father   . Hypertension Father   . Alzheimer's disease Mother    Social History  Substance Use Topics  . Smoking status: Never Smoker   . Smokeless tobacco: Never Used  . Alcohol Use: No    Review of Systems  Constitutional: Negative for fever and chills.  HENT: Negative for congestion and rhinorrhea.   Eyes: Negative for visual disturbance.  Respiratory: Negative for chest tightness.   Cardiovascular: Negative for chest pain, palpitations and leg swelling.  Gastrointestinal: Negative for nausea, vomiting, abdominal pain and diarrhea.  Genitourinary: Negative for dysuria and difficulty urinating.  Musculoskeletal: Negative for back pain, gait problem and neck pain.  Skin: Negative for color change, pallor and rash.  Neurological: Positive for seizures. Negative for dizziness and headaches.  Psychiatric/Behavioral: Negative for confusion.  All other systems reviewed and are negative.     Allergies  Morphine and related  Home Medications   Prior to Admission medications   Medication Sig Start Date  End Date Taking? Authorizing Provider  atorvastatin (LIPITOR) 40 MG tablet Take 20 mg by mouth daily.  08/18/12  Yes Historical Provider, MD  DULoxetine (CYMBALTA) 30 MG capsule Take 30 mg by mouth 2 (two) times daily. 02/22/16  Yes Historical Provider, MD  levETIRAcetam (KEPPRA) 750 MG tablet Take 2 tablets (1,500 mg total) by mouth 2 (two) times daily. 03/04/16  Yes Robbie Lis, MD  Misc Natural Products (OSTEO BI-FLEX JOINT  SHIELD) TABS Take 2 tablets by mouth daily.   Yes Historical Provider, MD  Multiple Vitamin (MULTIVITAMIN) tablet Take 1 tablet by mouth daily.   Yes Historical Provider, MD  nebivolol (BYSTOLIC) 10 MG tablet Take 10 mg by mouth daily.   Yes Historical Provider, MD  ALPRAZolam (XANAX) 0.25 MG tablet Take 0.25 mg by mouth 2 (two) times daily as needed for anxiety.    Historical Provider, MD  diazepam (DIASAT) 20 MG GEL Place 20 mg rectally once. For breathing trouble or seizure lasting >5 min. 03/22/16   Deno Etienne, DO  divalproex (DEPAKOTE) 250 MG DR tablet Take 3 tablets (750 mg total) by mouth 3 (three) times daily. 03/22/16   Deno Etienne, DO   BP 129/70 mmHg  Pulse 67  Temp(Src) 97.9 F (36.6 C) (Oral)  Resp 20  Ht 6' (1.829 m)  Wt 88.27 kg  BMI 26.39 kg/m2  SpO2 99% Physical Exam  Constitutional: He is oriented to person, place, and time. He appears well-developed and well-nourished.  HENT:  Head: Normocephalic and atraumatic.  Eyes: EOM are normal. Pupils are equal, round, and reactive to light.  Neck: Normal range of motion. Neck supple.  Cardiovascular: Normal rate, regular rhythm and intact distal pulses.   Pulmonary/Chest: Effort normal and breath sounds normal. No respiratory distress.  Abdominal: Soft. He exhibits no distension. There is no tenderness.  Musculoskeletal: Normal range of motion. He exhibits no edema or tenderness.  Neurological: He is alert and oriented to person, place, and time. He has normal reflexes. He displays normal reflexes. No cranial nerve deficit or sensory deficit. He exhibits normal muscle tone. Coordination normal. GCS eye subscore is 4. GCS verbal subscore is 5. GCS motor subscore is 6.  Skin: Skin is warm and dry. No rash noted.  Psychiatric: He has a normal mood and affect.  Nursing note and vitals reviewed.   ED Course  Procedures (including critical care time) Labs Review Labs Reviewed  CBC WITH DIFFERENTIAL/PLATELET - Abnormal; Notable for  the following:    RBC 3.89 (*)    Hemoglobin 12.4 (*)    HCT 37.7 (*)    All other components within normal limits  URINE RAPID DRUG SCREEN, HOSP PERFORMED - Abnormal; Notable for the following:    Benzodiazepines POSITIVE (*)    Barbiturates POSITIVE (*)    All other components within normal limits  URINALYSIS, DIPSTICK ONLY - Abnormal; Notable for the following:    APPearance CLOUDY (*)    All other components within normal limits  BASIC METABOLIC PANEL  MAGNESIUM  CBG MONITORING, ED    Imaging Review No results found. I have personally reviewed and evaluated these images and lab results as part of my medical decision-making.   EKG Interpretation None      MDM   Final diagnoses:  None    Afebrile with stable vital signs on arrival. Patient is back to his baseline per wife. Neurologic exam is nonfocal. Labs repeated. No significant electrolyte abnormalities. Po magnesium given for low-normal Mag. Patient was discussed  with neurology on-call who recommended an IV Depacon loading dose followed by Depakote 500 twice a day until he follows up with his neurologist. He had recent [place] level on 03/01/16 that was therapeutic. Plan is to observe patient in the emergency department while receiving medicines and labs. If no further seizure activity or desaturations, will discharge with close neurology follow-up.  Discussed with Dr. Reather Converse.     Gibson Ramp, MD 03/23/16 [id number]  Gibson Ramp, MD 03/23/16 [id number]  Elnora Morrison, MD 03/23/16 [phone]

## 2016-03-22 NOTE — ED Notes (Signed)
Pt. Coming from home via [facility] for altered mental status/seizure-like episodes. Pt. Seen here today for same. Pt. Family drove him to local fire dept. When he started having these episodes today. Pt. Not responsive to family, but very lethargic to EMS and unable to follow simple commands. Pt. Episodes described as balling up fists, shallow breaths, dropped oxygen saturation, left eye deviation. EMS reports oxygen seemed to arouse him. Pt. Denies any pain. Pt. AOx4 at this time.

## 2016-03-22 NOTE — ED Notes (Signed)
Pt is in stable condition upon d/c and ambulates from ED. 

## 2016-03-23 LAB — BASIC METABOLIC PANEL
Anion gap: 10 (ref 5–15)
BUN: 10 mg/dL (ref 6–20)
CALCIUM: 9 mg/dL (ref 8.9–10.3)
CHLORIDE: 103 mmol/L (ref 101–111)
CO2: 25 mmol/L (ref 22–32)
CREATININE: 0.86 mg/dL (ref 0.61–1.24)
GFR calc non Af Amer: >60 mL/min (ref >60)
Glucose, Bld: 89 mg/dL (ref 65–99)
Potassium: 4.5 mmol/L (ref 3.5–5.1)
SODIUM: 138 mmol/L (ref 135–145)

## 2016-03-23 LAB — MAGNESIUM: Magnesium: 1.7 mg/dL (ref 1.7–2.4)

## 2016-03-23 LAB — RAPID URINE DRUG SCREEN, HOSP PERFORMED
AMPHETAMINES: NOT DETECTED
Barbiturates: POSITIVE — AB
Benzodiazepines: POSITIVE — AB
Cocaine: NOT DETECTED
OPIATES: NOT DETECTED
TETRAHYDROCANNABINOL: NOT DETECTED

## 2016-03-23 LAB — URINALYSIS, DIPSTICK ONLY
BILIRUBIN URINE: NEGATIVE
Glucose, UA: NEGATIVE mg/dL
Hgb urine dipstick: NEGATIVE
KETONES UR: NEGATIVE mg/dL
LEUKOCYTES UA: NEGATIVE
NITRITE: NEGATIVE
PROTEIN: NEGATIVE mg/dL
Specific Gravity, Urine: 1.014 (ref 1.005–1.030)
pH: 5.5 (ref 5.0–8.0)

## 2016-03-23 MED ORDER — DIVALPROEX SODIUM 250 MG PO DR TAB: 500.0000 mg | DELAYED_RELEASE_TABLET | Freq: Two times a day (BID) | ORAL | Status: DC

## 2016-03-23 MED ORDER — MAGNESIUM OXIDE 400 (241.3 MG) MG PO TABS
400.0000 mg | ORAL_TABLET | Freq: Once | ORAL | Status: AC
  Administered 2016-03-23: 400 mg via ORAL
  Filled 2016-03-23: qty 1

## 2016-03-23 NOTE — ED Notes (Signed)
Patient left at this time with all belongings. 

## 2016-03-23 NOTE — Discharge Instructions (Signed)

## 2016-03-24 ENCOUNTER — Telehealth: Payer: Self-pay | Admitting: *Deleted

## 2016-03-24 ENCOUNTER — Telehealth: Payer: Self-pay | Admitting: Neurology

## 2016-03-24 LAB — PARATHYROID HORMONE, INTACT (NO CA): PTH: 22 pg/mL (ref 15–65)

## 2016-03-24 NOTE — Telephone Encounter (Signed)
I spoke to wife and she is aware of recommendation below. Voiced understanding.

## 2016-03-24 NOTE — Telephone Encounter (Signed)
Patient's wife is calling back and states her husband is continuing to have seizures.  She states [facility] doubled up on Depakote and Keppra. She would like to speak directly with Dr. Rexene Alberts about the medication and her husband's low oxygen levels.  Thanks!

## 2016-03-24 NOTE — Telephone Encounter (Signed)
Pt wife returning your call.Please call [phone].

## 2016-03-24 NOTE — Telephone Encounter (Signed)
Please see prior phone note. I called wife called back. She stated that the message I left was on patient's cell phone and he was upset because I mention "something about being mental". I advised her that I was talking about his mental status which is level of consciousness. She voiced understanding. Wife states that they had already went to ED this weekend and were advised to f/u with [place]. I offered her appt with Jinny Blossom NP and wife declined saying that patient has appt with pulmonology. Appt with pulmonology is not until June. I offered appt with [place] tomorrow again but wife declined. I advised her to call back if she changes her mind.

## 2016-03-24 NOTE — Telephone Encounter (Signed)
Pt's wife called back. Said she is concerned about his oxygen level being low. He is on additional medication-divalproex (DEPAKOTE) 250 MG DR tablet .

## 2016-03-24 NOTE — Telephone Encounter (Signed)
Please call patient or wife back. Please have patient FU with PCP for low oxygen levels. He can determine if patient needs to see a lung doctor next.

## 2016-03-24 NOTE — Telephone Encounter (Signed)
If patient had a change in mental status, he may have to go to the hospital, emergency room, not urgent care. If he is having back-to-back seizures, she will have to call 911. If he is clinically stable, we can try to bring him in for a nurse practitioner follow-up this week. Please call wife back and explain.

## 2016-03-24 NOTE — Telephone Encounter (Signed)
LM with recommendations below. Left call back number for further questions.

## 2016-03-24 NOTE — Telephone Encounter (Signed)
As you can see I spoke to wife about seeing PCP about low O2 levels. She stated that when he was in the hospital "O2 level dropped down to 19%". She does not understand why the ED did not refer to pulmonologist. I stated that I could not answer for them. Wife says that patient has had Depakote IV, prescribed Depakote and had increase in [place]. She states that "he is not himself" and continues to have seizures.  Also asks for letter to his work with Dr. Clydene Fake recommendations. I advised her that he is out for the week but she wants it this week. Wife requests to speak with doctor about patient's condition. Should we bring in to see NP? How do you advise?

## 2016-03-24 NOTE — Telephone Encounter (Signed)
Pt's wife called said he went ED due to low oxygen 2 times on Saturday 03/22/16. ED notes said to f/u with neurologist. Please call to discuss

## 2016-03-25 NOTE — Telephone Encounter (Signed)
Call Documentation      Laurence Spates, RN at 03/24/2016 5:25 PM     Status: Signed       Expand All Collapse All   Please see prior phone note. I called wife called back. She stated that the message I left was on patient's cell phone and he was upset because I mention "something about being mental". I advised her that I was talking about his mental status which is level of consciousness. She voiced understanding. Wife states that they had already went to ED this weekend and were advised to f/u with [place]. I offered her appt with Jinny Blossom NP and wife declined saying that patient has appt with pulmonology. Appt with pulmonology is not until June. I offered appt with [place] tomorrow again but wife declined. I advised her to call back if she changes her mind.             Pamelia Hoit Settle at 03/24/2016 4:53 PM     Status: Signed       Expand All Collapse All   Pt wife returning your call.Please call [phone].

## 2016-03-27 ENCOUNTER — Ambulatory Visit (INDEPENDENT_AMBULATORY_CARE_PROVIDER_SITE_OTHER): Payer: Managed Care, Other (non HMO) | Admitting: Neurology

## 2016-03-27 ENCOUNTER — Encounter: Payer: Self-pay | Admitting: Neurology

## 2016-03-27 VITALS — BP 122/80 | HR 66 | Ht 72.0 in | Wt 194.0 lb

## 2016-03-27 DIAGNOSIS — J9601 Acute respiratory failure with hypoxia: Secondary | ICD-10-CM

## 2016-03-27 DIAGNOSIS — R6889 Other general symptoms and signs: Secondary | ICD-10-CM | POA: Diagnosis not present

## 2016-03-27 DIAGNOSIS — IMO0001 Reserved for inherently not codable concepts without codable children: Secondary | ICD-10-CM

## 2016-03-27 NOTE — Patient Instructions (Addendum)
1.  Continue Keppra 1500mg  twice daily 2.  Continue Depakote 500mg  twice dailly 3.  Check CBC, CMP and trough Depakote level 4.  Refer you [facility] for EMU monitoring 5.  If you have a spell of unresponsiveness, must call 911 because he has trouble breathing 6.  No driving, no alcohol.

## 2016-03-27 NOTE — Progress Notes (Signed)
NEUROLOGY CONSULTATION NOTE  Nathan Ballard MRN: [id number] DOB: 07/10/1961  Referring provider: [facility] Primary care provider: Dr. Maudie Mercury  Reason for consult:  seizures  HISTORY OF PRESENT ILLNESS: Nathan Ballard is a 55 year old man with hypertension and hyperlipidemia who presents for second opinion regarding evaluation of seizures.  History obtained by patient, his wife, and hospital and ED notes.   He began experiencing several spells since March of this year.  He has had recurrent episodes in which he is unable to be woken up from a deep sleep with labored breathing.  It would last 10 to 15 minutes.  After he wakes up, he is either lucid or somewhat confused.  On a couple of occasions, he was given Narcan by EMS and he awoke.  However, UDS was negative so it was thought to be coincidental.  He had several ED visits.  On 02/28/16, he was admitted to [facility] following another spell.  He was administered Narcan and reportedly woke up, however UDS was negative in the ED.  CBC showed mildly elevated WBC of 12.8.  He was afebrile without sign of infection.  LFTs were elevated but normalized by time of discharge.  He was started on Keppra 59m twice daily.  CT of head was personally reviewed and was unremarkable.  MRI of brain was personally reviewed, which only revealed a right frontal developmental venous anomaly (stable compared to MRI in 2008), but no acute process.  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  He became unresponsive in the hospital and had an O2 sat of 19% and was making gurgling noises.  He was intubated for airway protection.  He was hypotensive but did not lose pulse. Echocardiogram was unremarkable and showed EF 55-60% with grade 1 diastolic dysfunction.  Routine EEG and overnight EEG were normal.  He was discharged on Keppra  15046mtwice daily.  Keppra level was 17.7.  Acute hypoxic and hypercapnic respiratory failure was felt to be secondary to seizure.  He returned to the ED on 03/22/16 after another spell.  He was started on Depakote DR 75080mhree times daily, however he was not given a loading dose.  He was also prescribed Diastat.  Later that evening, he was standing in the kitchen and developed a glassy look in his eyes.  He was slow to respond.  He was sitting and awake but started to shake all over, followed by loss of consciousness.  Otherwise, he does not demonstrate any convulsions..  Marland Kitchenis wife drove him to nearby fire station for vitals, where his O2 sat was 68% on room air and 86% on O2.  He returned to the ED. Blood pressure was in the 90s/60s and pulse in the 40s.  Labs, including UA, CBC and BMP, were unremarkable.  UDS only positive for benzos and barbiturates.  He was given a loading dose of IV Depacon followed by home dose of 500m49mice daily.   He continues to have spells in which he seems a little confused.  Last spell was last night.  He does not demonstrate tongue biting or urinary/bowel incontinence.  Blood sugar has always been okay.  He had a similar episode of confusion in 2008.  MRI of brain with and without contrast on 11/10/07 demonstrated the incidental developmental venous anomaly.  In hindsight, his wife thinks he probably has  had recurrent spells of confusion over the past few years.  He denies history of meningitis or head trauma.  He had an uncomplicated birth. He has no family history of seizures.  Thyroid panel from 03/01/16:  TSH 1.340, free T3 2.7, free T4 1.07 Most recent CBC from 03/22/16:  WBC 4.5, HGB 12.4, HCT 37.7, PLT 186. Most recent BMP from 03/22/16:  NA 138, K 4.5, CL 103, CO2 25, GLUC 89, BUN 10, CR 0.86, CA 9 Most recent LFTs from 03/04/16:  TP 5.3, albumin 3.1, ALK PHOS 48, AST 24, ALT 44, TB 0.6  PAST MEDICAL HISTORY: Past Medical History  Diagnosis Date  . Hypertension     . Hyperlipidemia   . Anxiety   . Arthritis   . Seizures ([place])     PAST SURGICAL HISTORY: Past Surgical History  Procedure Laterality Date  . Anterior cruciate ligament repair  1989, 2007    left  . Shoulder surgery  1999    right  . Ganglion cyst excision  1998    right wrist  . Removal toe nail  1993    right great toe  . Knee arthroscopy with medial menisectomy Right 09/19/2015    Procedure: RIGHT KNEE ARTHROSCOPY WITH PARTIAL MEDIAL MENISCECTOMY;  Surgeon: Leandrew Koyanagi, MD;  Location: [place];  Service: Orthopedics;  Laterality: Right;    MEDICATIONS: Current Outpatient Prescriptions on File Prior to Visit  Medication Sig Dispense Refill  . atorvastatin (LIPITOR) 40 MG tablet Take 20 mg by mouth daily.     . diazepam (DIASAT) 20 MG GEL Place 20 mg rectally once. For breathing trouble or seizure lasting >5 min. 1 Package 0  . divalproex (DEPAKOTE) 250 MG DR tablet Take 2 tablets (500 mg total) by mouth 2 (two) times daily. 180 tablet 0  . DULoxetine (CYMBALTA) 30 MG capsule Take 30 mg by mouth 2 (two) times daily.    Marland Kitchen levETIRAcetam (KEPPRA) 750 MG tablet Take 2 tablets (1,500 mg total) by mouth 2 (two) times daily. 60 tablet 0  . Misc Natural Products (OSTEO BI-FLEX JOINT SHIELD) TABS Take 2 tablets by mouth daily.    . Multiple Vitamin (MULTIVITAMIN) tablet Take 1 tablet by mouth daily.    . nebivolol (BYSTOLIC) 10 MG tablet Take 10 mg by mouth daily.     No current facility-administered medications on file prior to visit.    ALLERGIES: Allergies  Allergen Reactions  . Morphine And Related Nausea And Vomiting    FAMILY HISTORY: Family History  Problem Relation Age of Onset  . Colon cancer Neg Hx   . Stomach cancer Neg Hx   . Diabetes Father   . Heart disease Father   . Hypertension Father   . Alzheimer's disease Mother   . Alzheimer's disease Maternal Grandmother     SOCIAL HISTORY: Social History   Social History  . Marital Status:  Married    Spouse Name: N/A  . Number of Children: N/A  . Years of Education: N/A   Occupational History  . Not on file.   Social History Main Topics  . Smoking status: Never Smoker   . Smokeless tobacco: Never Used  . Alcohol Use: No  . Drug Use: No  . Sexual Activity: Not on file   Other Topics Concern  . Not on file   Social History Narrative    REVIEW OF SYSTEMS: Constitutional: No fevers, chills, or sweats, no generalized fatigue, change in appetite Eyes: No visual changes, double  vision, eye pain Ear, nose and throat: No hearing loss, ear pain, nasal congestion, sore throat Cardiovascular: No chest pain, palpitations Respiratory:  No shortness of breath at rest or with exertion, wheezes GastrointestinaI: No nausea, vomiting, diarrhea, abdominal pain, fecal incontinence Genitourinary:  No dysuria, urinary retention or frequency Musculoskeletal:  No neck pain, back pain Integumentary: No rash, pruritus, skin lesions Neurological: as above Psychiatric: No depression, insomnia, anxiety Endocrine: No palpitations, fatigue, diaphoresis, mood swings, change in appetite, change in weight, increased thirst Hematologic/Lymphatic:  No purpura, petechiae. Allergic/Immunologic: no itchy/runny eyes, nasal congestion, recent allergic reactions, rashes  PHYSICAL EXAM: Filed Vitals:   03/27/16 1044  BP: 122/80  Pulse: 66   General: No acute distress.  Patient appears well-groomed.  Head:  Normocephalic/atraumatic Eyes:  fundi examined but not visualized Neck: supple, no paraspinal tenderness, full range of motion Back: No paraspinal tenderness Heart: regular rate and rhythm Lungs: Clear to auscultation bilaterally. Vascular: No carotid bruits. Neurological Exam: Mental status: alert and oriented to person, place, and time, recent and remote memory intact, fund of knowledge intact, attention and concentration intact, speech fluent and not dysarthric, language intact. Cranial  nerves: CN I: not tested CN II: pupils equal, round and reactive to light, visual fields intact CN III, IV, VI:  full range of motion, no nystagmus, no ptosis CN V: facial sensation intact CN VII: upper and lower face symmetric CN VIII: hearing intact CN IX, X: gag intact, uvula midline CN XI: sternocleidomastoid and trapezius muscles intact CN XII: tongue midline Bulk & Tone: normal, no fasciculations. Motor:  5/5 throughout Sensation:  Pinprick and vibration sensation intact. Deep Tendon Reflexes:  2+ throughout, toes downgoing.  Finger to nose testing:  Without dysmetria.  Heel to shin:  Without dysmetria.  Gait:  Normal station and stride.  Able to turn and tandem walk. Romberg negative.  IMPRESSION: Spells with hypoxia and hypercapnia.  It has been suspected that these are seizures.  I question this diagnosis.  Semiology is unusual.  Also, symptoms have not improved on 2 antiepileptic medications.  Although seizures may cause hypoxia, I would think it to be more likely with generalized tonic clonic seizures.  PLAN: 1.  We will refer to EMU at [facility].  It is imperative that we find out what these spells are so he can appropriately be treated, since he has these hypoxic episodes which are potentially dangerous. 2.  Will continue Keppra 154m twice daily and Depakote 5061mtwice daily for now. 3.  Check CBC, CMP and VPA level. 4.  No driving.  Discussed that as per [place] law, he must be event-free for 6 months before he may drive. 5.  Since he has hypoxia with these spells, he needs to go to the ED with any recurrent episodes. 6.  I think it is important for medicine to evaluate for other possible causes (pulmonary or cardiac).  He has upcoming appointment with pulmonology.   7.  Follow up  Thank you for allowing me to take part in the care of this patient.  AdMetta ClinesDO  CC:  JaJani GravelMD

## 2016-03-27 NOTE — Progress Notes (Signed)
Chart forwarded.  

## 2016-03-28 ENCOUNTER — Telehealth: Payer: Self-pay | Admitting: Neurology

## 2016-03-28 ENCOUNTER — Other Ambulatory Visit (INDEPENDENT_AMBULATORY_CARE_PROVIDER_SITE_OTHER): Payer: Managed Care, Other (non HMO)

## 2016-03-28 DIAGNOSIS — R6889 Other general symptoms and signs: Secondary | ICD-10-CM

## 2016-03-28 DIAGNOSIS — IMO0001 Reserved for inherently not codable concepts without codable children: Secondary | ICD-10-CM

## 2016-03-28 LAB — COMPREHENSIVE METABOLIC PANEL
ALK PHOS: 46 U/L (ref 39–117)
ALT: 17 U/L (ref 0–53)
AST: 18 U/L (ref 0–37)
Albumin: 4.6 g/dL (ref 3.5–5.2)
BILIRUBIN TOTAL: 0.6 mg/dL (ref 0.2–1.2)
BUN: 12 mg/dL (ref 6–23)
CALCIUM: 9.4 mg/dL (ref 8.4–10.5)
CO2: 30 mEq/L (ref 19–32)
Chloride: 102 mEq/L (ref 96–112)
Creatinine, Ser: 0.84 mg/dL (ref 0.40–1.50)
GFR: 101.06 mL/min (ref >60.00)
GLUCOSE: 93 mg/dL (ref 70–99)
POTASSIUM: 4.7 meq/L (ref 3.5–5.1)
Sodium: 137 mEq/L (ref 135–145)
TOTAL PROTEIN: 6.6 g/dL (ref 6.0–8.3)

## 2016-03-28 LAB — CBC WITH DIFFERENTIAL/PLATELET
Basophils Absolute: 0 10*3/uL (ref 0.0–0.1)
Basophils Relative: 0.8 % (ref 0.0–3.0)
EOS PCT: 1.9 % (ref 0.0–5.0)
Eosinophils Absolute: 0.1 10*3/uL (ref 0.0–0.7)
HEMATOCRIT: 41 % (ref 39.0–52.0)
Hemoglobin: 13.9 g/dL (ref 13.0–17.0)
LYMPHS ABS: 1.4 10*3/uL (ref 0.7–4.0)
LYMPHS PCT: 34.2 % (ref 12.0–46.0)
MCHC: 33.9 g/dL (ref 30.0–36.0)
MCV: 95.3 fl (ref 78.0–100.0)
MONOS PCT: 11.4 % (ref 3.0–12.0)
Monocytes Absolute: 0.5 10*3/uL (ref 0.1–1.0)
NEUTROS ABS: 2.1 10*3/uL (ref 1.4–7.7)
NEUTROS PCT: 51.7 % (ref 43.0–77.0)
Platelets: 204 10*3/uL (ref 150.0–400.0)
RBC: 4.31 Mil/uL (ref 4.22–5.81)
RDW: 13.1 % (ref 11.5–15.5)
WBC: 4.1 10*3/uL (ref 4.0–10.5)

## 2016-03-28 NOTE — Telephone Encounter (Signed)
Nathan Ballard 08-Dec-2060. His wife Beverlee Nims called wanting to know what exactly is being checked with the blood work that he came in for today. He also had blood work on Sunday at [facility] hospital. She would like you to please call her back at [phone]. Thank you

## 2016-03-28 NOTE — Telephone Encounter (Signed)
Detailed message left for wife.

## 2016-03-29 ENCOUNTER — Encounter (HOSPITAL_COMMUNITY): Payer: Self-pay | Admitting: Emergency Medicine

## 2016-03-29 ENCOUNTER — Observation Stay (HOSPITAL_COMMUNITY)
Admission: EM | Admit: 2016-03-29 | Discharge: 2016-04-01 | Disposition: A | Discharge status: Home / Self Care | Payer: Managed Care, Other (non HMO) | Attending: Internal Medicine | Admitting: Internal Medicine

## 2016-03-29 DIAGNOSIS — R6889 Other general symptoms and signs: Secondary | ICD-10-CM

## 2016-03-29 DIAGNOSIS — I10 Essential (primary) hypertension: Secondary | ICD-10-CM | POA: Diagnosis not present

## 2016-03-29 DIAGNOSIS — F419 Anxiety disorder, unspecified: Secondary | ICD-10-CM | POA: Diagnosis not present

## 2016-03-29 DIAGNOSIS — R569 Unspecified convulsions: Secondary | ICD-10-CM

## 2016-03-29 DIAGNOSIS — E785 Hyperlipidemia, unspecified: Secondary | ICD-10-CM | POA: Diagnosis not present

## 2016-03-29 DIAGNOSIS — R55 Syncope and collapse: Secondary | ICD-10-CM | POA: Diagnosis present

## 2016-03-29 DIAGNOSIS — G40209 Localization-related (focal) (partial) symptomatic epilepsy and epileptic syndromes with complex partial seizures, not intractable, without status epilepticus: Principal | ICD-10-CM | POA: Diagnosis present

## 2016-03-29 DIAGNOSIS — R001 Bradycardia, unspecified: Secondary | ICD-10-CM | POA: Insufficient documentation

## 2016-03-29 DIAGNOSIS — R0902 Hypoxemia: Secondary | ICD-10-CM | POA: Diagnosis not present

## 2016-03-29 DIAGNOSIS — J9601 Acute respiratory failure with hypoxia: Secondary | ICD-10-CM | POA: Diagnosis present

## 2016-03-29 LAB — VALPROIC ACID LEVEL: Valproic Acid Lvl: 60 ug/mL (ref 50.0–100.0)

## 2016-03-29 NOTE — ED Provider Notes (Signed)
CSN: [id number]     Arrival date & time 03/29/16  2327 History  By signing my name below, I, Hansel Feinstein, attest that this documentation has been prepared under the direction and in the presence of Orpah Greek, MD. Electronically Signed: Hansel Feinstein, ED Scribe. 03/30/2016. 12:05 AM.     Chief Complaint  Patient presents with  . Loss of Consciousness   The history is provided by the patient and the EMS personnel. No language interpreter was used.   HPI Comments: Nathan Ballard is a 55 y.o. male brought in by ambulance with h/o seizures, HTN, HLD who presents to the Emergency Department complaining of multiple episodes of decreased responsiveness occurring for 3 months, with reoccurrence this evening. Per EMS, pt's wife called EMS after the pt became unresponsive, apneic and lethargic this evening. Per EMS, the pt is transiently confused after these episodes. EMS reports that he became bradycardic, bradypneic and de-sated multiple times en route with episodes of unresponsiveness. CBG 115 en route. Per pt, he is generally not aware when these episodes come on and is not able to recall them. He states he is told by his wife that some episodes are accompanied by generalized shaking and some are not. He notes that he is able to recall a few episodes during which his legs shake. He states prior to 3 months ago, his last seizure was 10 years ago. He has been seen by neurology and has had multiple negative EEGs. Pt states he has also been placed on Keppra and Depakote in the last 3 months by neurology.    Past Medical History  Diagnosis Date  . Hypertension   . Hyperlipidemia   . Anxiety   . Arthritis   . Seizures [facility])    Past Surgical History  Procedure Laterality Date  . Anterior cruciate ligament repair  1989, 2007    left  . Shoulder surgery  1999    right  . Ganglion cyst excision  1998    right wrist  . Removal toe nail  1993    right great toe  . Knee arthroscopy with  medial menisectomy Right 09/19/2015    Procedure: RIGHT KNEE ARTHROSCOPY WITH PARTIAL MEDIAL MENISCECTOMY;  Surgeon: Leandrew Koyanagi, MD;  Location: [place];  Service: Orthopedics;  Laterality: Right;   Family History  Problem Relation Age of Onset  . Colon cancer Neg Hx   . Stomach cancer Neg Hx   . Diabetes Father   . Heart disease Father   . Hypertension Father   . Alzheimer's disease Mother   . Alzheimer's disease Maternal Grandmother    Social History  Substance Use Topics  . Smoking status: Never Smoker   . Smokeless tobacco: Never Used  . Alcohol Use: No    Review of Systems  Respiratory:       +bradypnea (resolved)  Neurological: Negative for tremors and seizures.       +episodes of decreased responsiveness  Psychiatric/Behavioral: Positive for confusion (transient).  All other systems reviewed and are negative.  Allergies  Morphine and related  Home Medications   Prior to Admission medications   Medication Sig Start Date End Date Taking? Authorizing Provider  atorvastatin (LIPITOR) 40 MG tablet Take 20 mg by mouth daily.  08/18/12  Yes Historical Provider, MD  diazepam (DIASAT) 20 MG GEL Place 20 mg rectally once. For breathing trouble or seizure lasting >5 min. 03/22/16  Yes Deno Etienne, DO  divalproex (DEPAKOTE) 250 MG DR  tablet Take 2 tablets (500 mg total) by mouth 2 (two) times daily. 03/23/16  Yes Gibson Ramp, MD  DULoxetine (CYMBALTA) 30 MG capsule Take 30 mg by mouth 2 (two) times daily. 02/22/16  Yes Historical Provider, MD  levETIRAcetam (KEPPRA) 750 MG tablet Take 2 tablets (1,500 mg total) by mouth 2 (two) times daily. 03/04/16  Yes Robbie Lis, MD  Misc Natural Products (OSTEO BI-FLEX JOINT SHIELD) TABS Take 2 tablets by mouth daily.   Yes Historical Provider, MD  Multiple Vitamin (MULTIVITAMIN) tablet Take 1 tablet by mouth daily.   Yes Historical Provider, MD  nebivolol (BYSTOLIC) 10 MG tablet Take 10 mg by mouth daily.   Yes Historical  Provider, MD   BP 127/73 mmHg  Pulse 66  Temp(Src) 98.2 F (36.8 C) (Oral)  Resp 18  Ht 6' (1.829 m)  Wt 201 lb 12.8 oz (91.536 kg)  BMI 27.36 kg/m2  SpO2 99% Physical Exam  Constitutional: He is oriented to person, place, and time. He appears well-developed and well-nourished. No distress.  HENT:  Head: Normocephalic and atraumatic.  Right Ear: Hearing normal.  Left Ear: Hearing normal.  Nose: Nose normal.  Mouth/Throat: Oropharynx is clear and moist and mucous membranes are normal.  Eyes: Conjunctivae and EOM are normal. Pupils are equal, round, and reactive to light.  Neck: Normal range of motion. Neck supple.  Cardiovascular: Regular rhythm.  Exam reveals no gallop and no friction rub.   No murmur heard. Pulmonary/Chest: Effort normal and breath sounds normal. No respiratory distress. He exhibits no tenderness.  Abdominal: Soft. Normal appearance and bowel sounds are normal. There is no hepatosplenomegaly. There is no tenderness. There is no rebound, no guarding, no tenderness at McBurney's point and negative Murphy's sign. No hernia.  Musculoskeletal: Normal range of motion.  Neurological: He is alert and oriented to person, place, and time. He has normal strength. No cranial nerve deficit or sensory deficit. Coordination normal. GCS eye subscore is 4. GCS verbal subscore is 5. GCS motor subscore is 6.  Skin: Skin is warm and dry. No rash noted.  Psychiatric: He has a normal mood and affect. His behavior is normal. Thought content normal.  Nursing note and vitals reviewed.   ED Course  Procedures (including critical care time) DIAGNOSTIC STUDIES: Oxygen Saturation is 93% on RA, adequate by my interpretation.    COORDINATION OF CARE: 11:52 PM Discussed treatment plan with pt at bedside which includes CXR, lab work and pt agreed to plan.    Labs Review Labs Reviewed  CBC WITH DIFFERENTIAL/PLATELET - Abnormal; Notable for the following:    RBC 3.97 (*)    Hemoglobin  12.3 (*)    HCT 37.7 (*)    All other components within normal limits  BASIC METABOLIC PANEL - Abnormal; Notable for the following:    Calcium 8.7 (*)    All other components within normal limits  COMPREHENSIVE METABOLIC PANEL - Abnormal; Notable for the following:    Total Protein 5.7 (*)    ALT 15 (*)    All other components within normal limits  I-STAT ARTERIAL BLOOD GAS, ED - Abnormal; Notable for the following:    pCO2 arterial 50.5 (*)    pO2, Arterial 72.0 (*)    Bicarbonate 28.8 (*)    Acid-Base Excess 3.0 (*)    All other components within normal limits  TROPONIN I  CBC    Imaging Review Dg Chest 2 View  03/30/2016  CLINICAL DATA:  Seizures tonight.  History of seizures. Shortness of breath. EXAM: CHEST  2 VIEW COMPARISON:  03/01/2016 FINDINGS: Normal heart size and pulmonary vascularity. Linear scarring or atelectasis in the left lower lung. No focal airspace disease or consolidation. No blunting of costophrenic angles. No pneumothorax. Mediastinal contours appear intact. Old resection or resorption of the distal right clavicle. Degenerative changes in the spine and shoulders. IMPRESSION: No active cardiopulmonary disease. Electronically Signed   By: Lucienne Capers M.D.   On: 03/30/2016 02:07   I have personally reviewed and evaluated these images and lab results as part of my medical decision-making.   EKG Interpretation   Date/Time:  Saturday Mar 29 2016 23:44:26 EDT Ventricular Rate:  76 PR Interval:  167 QRS Duration: 91 QT Interval:  370 QTC Calculation: 416 R Axis:   37 Text Interpretation:  Sinus rhythm Normal ECG Confirmed by POLLINA  MD,  CHRISTOPHER [phone]) on 03/30/2016 12:01:06 AM      2:33 AM Discussed pt with hospitalist Dr. Alcario Drought, who will admit pt to tele obvs.   MDM   Final diagnoses:  Syncope, unspecified syncope type  Patient presents to the emergency department for evaluation of syncopal episodes. Patient has reportedly been experiencing  intermittent episodes of syncope over the last 3 months. These have been treated as seizures. Reviewing his records, however, reveals that he has had CT head, CT angiography of the head, MRI brain, 48 hour EEG. All of these has have been normal. Symptoms do not seem consistent with seizure. He becomes apneic and bradycardic and then passes out. This has been witnessed several times today, including by EMS. Heart rate was around 40 when he was being transported. Based on this, and the fact that his neurologic evaluation has been negative, will observe in the hospital for cardiac evaluation of syncope.  I personally performed the services described in this documentation, which was scribed in my presence. The recorded information has been reviewed and is accurate.   Orpah Greek, MD 03/31/16 [phone]

## 2016-03-29 NOTE — ED Notes (Signed)
Pt brought to ED by [facility] from home after having a syncope episode, pt found unresponsive by wife with no breathing, wife took pt to fire department, SPO2 on low 70% on RA, pt very confuse and lethargic, pt is been having intermittent LOC for the past 4 months under the care of Neurology and on seizure medications, but syncope episodes continues to happen over and over, pt place on capnography by EMS with  A ETCO2 on the low 50's. EKG SR to SB on the low 40's.

## 2016-03-30 ENCOUNTER — Emergency Department (HOSPITAL_COMMUNITY): Payer: Managed Care, Other (non HMO)

## 2016-03-30 ENCOUNTER — Observation Stay (HOSPITAL_COMMUNITY): Payer: Managed Care, Other (non HMO)

## 2016-03-30 DIAGNOSIS — J9601 Acute respiratory failure with hypoxia: Secondary | ICD-10-CM

## 2016-03-30 DIAGNOSIS — R55 Syncope and collapse: Secondary | ICD-10-CM | POA: Diagnosis not present

## 2016-03-30 DIAGNOSIS — R4182 Altered mental status, unspecified: Secondary | ICD-10-CM

## 2016-03-30 DIAGNOSIS — R0902 Hypoxemia: Secondary | ICD-10-CM

## 2016-03-30 DIAGNOSIS — R569 Unspecified convulsions: Secondary | ICD-10-CM | POA: Diagnosis not present

## 2016-03-30 DIAGNOSIS — R6889 Other general symptoms and signs: Secondary | ICD-10-CM

## 2016-03-30 LAB — CBC WITH DIFFERENTIAL/PLATELET
BASOS ABS: 0 10*3/uL (ref 0.0–0.1)
BASOS PCT: 0 %
EOS ABS: 0.1 10*3/uL (ref 0.0–0.7)
EOS PCT: 2 %
HEMATOCRIT: 37.7 % — AB (ref 39.0–52.0)
Hemoglobin: 12.3 g/dL — ABNORMAL LOW (ref 13.0–17.0)
Lymphocytes Relative: 29 %
Lymphs Abs: 1.3 10*3/uL (ref 0.7–4.0)
MCH: 31 pg (ref 26.0–34.0)
MCHC: 32.6 g/dL (ref 30.0–36.0)
MCV: 95 fL (ref 78.0–100.0)
MONO ABS: 0.6 10*3/uL (ref 0.1–1.0)
MONOS PCT: 14 %
Neutro Abs: 2.5 10*3/uL (ref 1.7–7.7)
Neutrophils Relative %: 55 %
PLATELETS: 162 10*3/uL (ref 150–400)
RBC: 3.97 MIL/uL — ABNORMAL LOW (ref 4.22–5.81)
RDW: 12.1 % (ref 11.5–15.5)
WBC: 4.5 10*3/uL (ref 4.0–10.5)

## 2016-03-30 LAB — BASIC METABOLIC PANEL
ANION GAP: 8 (ref 5–15)
BUN: 13 mg/dL (ref 6–20)
CALCIUM: 8.7 mg/dL — AB (ref 8.9–10.3)
CO2: 31 mmol/L (ref 22–32)
CREATININE: 0.85 mg/dL (ref 0.61–1.24)
Chloride: 102 mmol/L (ref 101–111)
Glucose, Bld: 96 mg/dL (ref 65–99)
Potassium: 4.3 mmol/L (ref 3.5–5.1)
Sodium: 141 mmol/L (ref 135–145)

## 2016-03-30 LAB — I-STAT ARTERIAL BLOOD GAS, ED
ACID-BASE EXCESS: 3 mmol/L — AB (ref 0.0–2.0)
BICARBONATE: 28.8 meq/L — AB (ref 20.0–24.0)
O2 SAT: 93 %
PH ART: 7.364 (ref 7.350–7.450)
PO2 ART: 72 mmHg — AB (ref 80.0–100.0)
TCO2: 30 mmol/L (ref 0–100)
pCO2 arterial: 50.5 mmHg — ABNORMAL HIGH (ref 35.0–45.0)

## 2016-03-30 LAB — TROPONIN I: Troponin I: <0.03 ng/mL (ref <0.031)

## 2016-03-30 MED ORDER — DULOXETINE HCL 30 MG PO CPEP
30.0000 mg | ORAL_CAPSULE | Freq: Two times a day (BID) | ORAL | Status: DC
  Administered 2016-03-30 – 2016-04-01 (×5): 30 mg via ORAL
  Filled 2016-03-30 (×5): qty 1

## 2016-03-30 MED ORDER — DIAZEPAM 10 MG RE GEL: 20.0000 mg | Freq: Once | RECTAL | Status: DC | PRN

## 2016-03-30 MED ORDER — ENOXAPARIN SODIUM 40 MG/0.4ML ~~LOC~~ SOLN
40.0000 mg | SUBCUTANEOUS | Status: DC
  Administered 2016-03-30 – 2016-03-31 (×2): 40 mg via SUBCUTANEOUS
  Filled 2016-03-30 (×2): qty 0.4

## 2016-03-30 MED ORDER — SODIUM CHLORIDE 0.9% FLUSH
3.0000 mL | Freq: Two times a day (BID) | INTRAVENOUS | Status: DC
  Administered 2016-03-30 – 2016-04-01 (×5): 3 mL via INTRAVENOUS

## 2016-03-30 MED ORDER — LEVETIRACETAM 500 MG PO TABS
1500.0000 mg | ORAL_TABLET | Freq: Two times a day (BID) | ORAL | Status: DC
  Administered 2016-03-30: 1500 mg via ORAL
  Filled 2016-03-30 (×2): qty 3

## 2016-03-30 MED ORDER — DIVALPROEX SODIUM 250 MG PO DR TAB
500.0000 mg | DELAYED_RELEASE_TABLET | Freq: Two times a day (BID) | ORAL | Status: DC
  Administered 2016-03-30: 500 mg via ORAL
  Filled 2016-03-30 (×2): qty 2

## 2016-03-30 MED ORDER — LORAZEPAM 2 MG/ML IJ SOLN: 1.0000 mg | INTRAMUSCULAR | Status: DC | PRN

## 2016-03-30 MED ORDER — ATORVASTATIN CALCIUM 10 MG PO TABS
20.0000 mg | ORAL_TABLET | Freq: Every day | ORAL | Status: DC
  Administered 2016-03-30 – 2016-04-01 (×3): 20 mg via ORAL
  Filled 2016-03-30 (×3): qty 2

## 2016-03-30 MED ORDER — OSTEO BI-FLEX JOINT SHIELD PO TABS: 2.0000 | ORAL_TABLET | Freq: Every day | ORAL | Status: DC

## 2016-03-30 MED ORDER — ADULT MULTIVITAMIN W/MINERALS CH
1.0000 | ORAL_TABLET | Freq: Every day | ORAL | Status: DC
  Administered 2016-03-30 – 2016-04-01 (×3): 1 via ORAL
  Filled 2016-03-30 (×3): qty 1

## 2016-03-30 MED ORDER — NEBIVOLOL HCL 10 MG PO TABS: 10.0000 mg | ORAL_TABLET | Freq: Every day | ORAL | Status: DC

## 2016-03-30 MED ORDER — NEBIVOLOL HCL 5 MG PO TABS
5.0000 mg | ORAL_TABLET | Freq: Every day | ORAL | Status: DC
  Administered 2016-03-30 – 2016-04-01 (×3): 5 mg via ORAL
  Filled 2016-03-30 (×3): qty 1

## 2016-03-30 NOTE — Progress Notes (Signed)
[facility] PROGRESS NOTE  HARTZELL RICCIARDELLI [id number] DOB: [birth year] DOA: 03/29/2016  PCP: Jani Gravel, MD  Brief HPI: 55 year old Caucasian male with a past medical history of hypertension, hyperlipidemia , started having shaking episodes, as well as periods of apnea. This has been ongoing for the last 2 months or so. Patient was seen by neurology. There was concern about seizure activity and he was placed on antiepileptics. He's had multiple EEGs which did not show any epileptiform activity. Patient presented last night with similar episodes. He was hospitalized for further management.  Past medical history:  Past Medical History  Diagnosis Date  . Hypertension   . Hyperlipidemia   . Anxiety   . Arthritis   . Seizures [facility])     Consultants: Neurology. Cardiology. Pulmonology  Procedures: None as yet  Antibiotics: None  Subjective: Patient feels well this morning. Denies any chest pain, shortness of breath. No further episodes of passing out spells or seizures since he has been in the hospital. His wife is at the bedside.  Objective:  Vital Signs  Filed Vitals:   03/30/16 0245 03/30/16 0300 03/30/16 0315 03/30/16 0402  BP: 97/59 108/61 112/69 112/60  Pulse: 51 59 57 56  Temp:    97.7 F (36.5 C)  TempSrc:    Oral  Resp:    18  Height:    6' (1.829 m)  Weight:    91.536 kg (201 lb 12.8 oz)  SpO2: 96% 97% 99% 97%   No intake or output data in the 24 hours ending 03/30/16 1155 Filed Weights   03/29/16 2352 03/30/16 0402  Weight: 87.998 kg (194 lb) 91.536 kg (201 lb 12.8 oz)    General appearance: alert, cooperative, appears stated age and no distress Resp: clear to auscultation bilaterally Cardio: regular rate and rhythm, S1, S2 normal, no murmur, click, rub or gallop GI: soft, non-tender; bowel sounds normal; no masses,  no organomegaly Extremities: extremities normal, atraumatic, no cyanosis or edema Neurologic: Awake and alert. Oriented 3.  Cranial nerves II-12 intact. Strength equal bilateral upper and lower extremities.  Lab Results:  Data Reviewed: I have personally reviewed following labs and imaging studies  CBC:  Recent Labs Lab 03/28/16 1015 03/30/16 0027  WBC 4.1 4.5  NEUTROABS 2.1 2.5  HGB 13.9 12.3*  HCT 41.0 37.7*  MCV 95.3 95.0  PLT 204.0 0000000   Basic Metabolic Panel:  Recent Labs Lab 03/28/16 1015 03/30/16 0027  NA 137 141  K 4.7 4.3  CL 102 102  CO2 30 31  GLUCOSE 93 96  BUN 12 13  CREATININE 0.84 0.85  CALCIUM 9.4 8.7*   GFR: Estimated Creatinine Clearance: 109 mL/min (by C-G formula based on Cr of 0.85).  Liver Function Tests:  Recent Labs Lab 03/28/16 1015  AST 18  ALT 17  ALKPHOS 46  BILITOT 0.6  PROT 6.6  ALBUMIN 4.6   Cardiac Enzymes:  Recent Labs Lab 03/30/16 0027  TROPONINI <0.03   Urine analysis:    Component Value Date/Time   COLORURINE YELLOW 03/23/2016 0130   APPEARANCEUR CLOUDY* 03/23/2016 0130   LABSPEC 1.014 03/23/2016 0130   PHURINE 5.5 03/23/2016 0130   GLUCOSEU NEGATIVE 03/23/2016 0130   HGBUR NEGATIVE 03/23/2016 0130   BILIRUBINUR NEGATIVE 03/23/2016 0130   KETONESUR NEGATIVE 03/23/2016 0130   PROTEINUR NEGATIVE 03/23/2016 0130   UROBILINOGEN 0.2 11/06/2007 2042   NITRITE NEGATIVE 03/23/2016 0130   LEUKOCYTESUR NEGATIVE 03/23/2016 0130     Radiology Studies: Dg Chest 2  View  03/30/2016  CLINICAL DATA:  Seizures tonight. History of seizures. Shortness of breath. EXAM: CHEST  2 VIEW COMPARISON:  03/01/2016 FINDINGS: Normal heart size and pulmonary vascularity. Linear scarring or atelectasis in the left lower lung. No focal airspace disease or consolidation. No blunting of costophrenic angles. No pneumothorax. Mediastinal contours appear intact. Old resection or resorption of the distal right clavicle. Degenerative changes in the spine and shoulders. IMPRESSION: No active cardiopulmonary disease. Electronically Signed   By: Lucienne Capers M.D.    On: 03/30/2016 02:07     Medications:  Scheduled: . atorvastatin  20 mg Oral Daily  . DULoxetine  30 mg Oral BID  . enoxaparin (LOVENOX) injection  40 mg Subcutaneous Q24H  . multivitamin with minerals  1 tablet Oral Daily  . nebivolol  5 mg Oral Daily  . 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 Patient is on 2 different antiepileptics and continues to have these episodes. Patient has been seen by 2 different neurologists as an outpatient. He reports that he is supposed to see pulmonology as well. Recommendation was made by his last neurologist for the patient to be seen by cardiology as well. Patient has had extensive workup in the past including MRI brain, echocardiogram, multiple EEGs. He reports having had a sleep study about 2 years ago and was not placed on CPAP at that time. Since his diagnosis is not entirely clear, we will go ahead and consult pulmonology as well as cardiology. Neurology to also initiate long-term monitoring today. And so his antiepileptics have been discontinued for now.  History of essential hypertension Continue nebivolol. He is noted to be slightly bradycardic, although he is stable. Blood pressure is reasonably well controlled. Consider checking orthostatics.   DVT Prophylaxis: Lovenox    Code Status: Full code  Family Communication: Discussed with the patient and his wife  Disposition Plan: Workup as outlined above. Await specialty input.      [facility] Pager [phone] 03/30/2016, 11:55 AM  If 7PM-7AM, please contact night-coverage at [email or web address], password [email or web address]

## 2016-03-30 NOTE — H&P (Signed)
History and Physical    Nathan Ballard [id number] DOB: Feb 03, 1961 DOA: 03/29/2016  Referring MD/NP/PA: Dr. Betsey Holiday PCP: Jani Gravel, MD Outpatient Specialists: [place] Patient coming from: Home  Chief Complaint: LOC  HPI: Nathan Ballard is a 55 y.o. male with medical history significant of seizures, HTN, HLD who presents to the Emergency Department complaining of multiple episodes of decreased responsiveness occurring for 3 months, with reoccurrence this evening. Per EMS, pt's wife called EMS after the pt became unresponsive, apneic and lethargic this evening. Per EMS, the pt is transiently confused after these episodes. EMS reports that he became bradycardic, bradypneic and de-sated multiple times en route with episodes of unresponsiveness. CBG 115 en route. Per pt, he is generally not aware when these episodes come on and is not able to recall them. He states he is told by his wife that some episodes are accompanied by generalized shaking and some are not. He notes that he is able to recall a few episodes during which his legs shake. He states prior to 3 months ago, his last seizure was 10 years ago. He has been seen by neurology and has had multiple negative EEGs. Pt states he has also been placed on Keppra and Depakote in the last 3 months by neurology.   Patient recently saw his neurologist 3 days ago who recommended EMU testing, also recommended cardiac and pulmonology evaluation for the episodes as well.  ED Course: Patient was initially very confused, lethargic, and hypoxic.  He slowly improved in the ED and is now back to baseline mental status and feels at baseline.  Review of Systems: As per HPI otherwise 10 point review of systems negative.    Past Medical History  Diagnosis Date  . Hypertension   . Hyperlipidemia   . Anxiety   . Arthritis   . Seizures [facility])     Past Surgical History  Procedure Laterality Date  . Anterior cruciate ligament repair  1989, 2007    left    . Shoulder surgery  1999    right  . Ganglion cyst excision  1998    right wrist  . Removal toe nail  1993    right great toe  . Knee arthroscopy with medial menisectomy Right 09/19/2015    Procedure: RIGHT KNEE ARTHROSCOPY WITH PARTIAL MEDIAL MENISCECTOMY;  Surgeon: Leandrew Koyanagi, MD;  Location: [place];  Service: Orthopedics;  Laterality: Right;     reports that he has never smoked. He has never used smokeless tobacco. He reports that he does not drink alcohol or use illicit drugs.  Allergies  Allergen Reactions  . Morphine And Related Nausea And Vomiting    Family History  Problem Relation Age of Onset  . Colon cancer Neg Hx   . Stomach cancer Neg Hx   . Diabetes Father   . Heart disease Father   . Hypertension Father   . Alzheimer's disease Mother   . Alzheimer's disease Maternal Grandmother      Prior to Admission medications   Medication Sig Start Date End Date Taking? Authorizing Provider  atorvastatin (LIPITOR) 40 MG tablet Take 20 mg by mouth daily.  08/18/12  Yes Historical Provider, MD  diazepam (DIASAT) 20 MG GEL Place 20 mg rectally once. For breathing trouble or seizure lasting >5 min. 03/22/16  Yes Deno Etienne, DO  divalproex (DEPAKOTE) 250 MG DR tablet Take 2 tablets (500 mg total) by mouth 2 (two) times daily. 03/23/16  Yes Gibson Ramp, MD  DULoxetine (CYMBALTA) 30 MG capsule Take 30 mg by mouth 2 (two) times daily. 02/22/16  Yes Historical Provider, MD  levETIRAcetam (KEPPRA) 750 MG tablet Take 2 tablets (1,500 mg total) by mouth 2 (two) times daily. 03/04/16  Yes Robbie Lis, MD  Misc Natural Products (OSTEO BI-FLEX JOINT SHIELD) TABS Take 2 tablets by mouth daily.   Yes Historical Provider, MD  Multiple Vitamin (MULTIVITAMIN) tablet Take 1 tablet by mouth daily.   Yes Historical Provider, MD  nebivolol (BYSTOLIC) 10 MG tablet Take 10 mg by mouth daily.   Yes Historical Provider, MD    Physical Exam: Filed Vitals:   03/29/16 2346 03/29/16  2352  BP:  113/63  Pulse:  78  Temp:  98.2 F (36.8 C)  TempSrc:  Oral  Resp:  14  Height:  5\' 11"  (1.803 m)  Weight:  87.998 kg (194 lb)  SpO2: 93% 99%      Constitutional: NAD, calm, comfortable Filed Vitals:   03/29/16 2346 03/29/16 2352  BP:  113/63  Pulse:  78  Temp:  98.2 F (36.8 C)  TempSrc:  Oral  Resp:  14  Height:  5\' 11"  (1.803 m)  Weight:  87.998 kg (194 lb)  SpO2: 93% 99%   Eyes: PERRL, lids and conjunctivae normal ENMT: Mucous membranes are moist. Posterior pharynx clear of any exudate or lesions.Normal dentition.  Neck: normal, supple, no masses, no thyromegaly Respiratory: clear to auscultation bilaterally, no wheezing, no crackles. Normal respiratory effort. No accessory muscle use.  Cardiovascular: Regular rate and rhythm, no murmurs / rubs / gallops. No extremity edema. 2+ pedal pulses. No carotid bruits.  Abdomen: no tenderness, no masses palpated. No hepatosplenomegaly. Bowel sounds positive.  Musculoskeletal: no clubbing / cyanosis. No joint deformity upper and lower extremities. Good ROM, no contractures. Normal muscle tone.  Skin: no rashes, lesions, ulcers. No induration Neurologic: CN 2-12 grossly intact. Sensation intact, DTR normal. Strength 5/5 in all 4.  Psychiatric: Normal judgment and insight. Alert and oriented x 3. Normal mood.    Labs on Admission: I have personally reviewed following labs and imaging studies  CBC:  Recent Labs Lab 03/28/16 1015 03/30/16 0027  WBC 4.1 4.5  NEUTROABS 2.1 2.5  HGB 13.9 12.3*  HCT 41.0 37.7*  MCV 95.3 95.0  PLT 204.0 0000000   Basic Metabolic Panel:  Recent Labs Lab 03/28/16 1015 03/30/16 0027  NA 137 141  K 4.7 4.3  CL 102 102  CO2 30 31  GLUCOSE 93 96  BUN 12 13  CREATININE 0.84 0.85  CALCIUM 9.4 8.7*   GFR: Estimated Creatinine Clearance: 105.8 mL/min (by C-G formula based on Cr of 0.85). Liver Function Tests:  Recent Labs Lab 03/28/16 1015  AST 18  ALT 17  ALKPHOS 46   BILITOT 0.6  PROT 6.6  ALBUMIN 4.6   No results for input(s): LIPASE, AMYLASE in the last 168 hours. No results for input(s): AMMONIA in the last 168 hours. Coagulation Profile: No results for input(s): INR, PROTIME in the last 168 hours. Cardiac Enzymes:  Recent Labs Lab 03/30/16 0027  TROPONINI <0.03   BNP (last 3 results) No results for input(s): PROBNP in the last 8760 hours. HbA1C: No results for input(s): HGBA1C in the last 72 hours. CBG: No results for input(s): GLUCAP in the last 168 hours. Lipid Profile: No results for input(s): CHOL, HDL, LDLCALC, TRIG, CHOLHDL, LDLDIRECT in the last 72 hours. Thyroid Function Tests: No results for input(s): TSH, T4TOTAL, FREET4, T3FREE, THYROIDAB  in the last 72 hours. Anemia Panel: No results for input(s): VITAMINB12, FOLATE, FERRITIN, TIBC, IRON, RETICCTPCT in the last 72 hours. Urine analysis:    Component Value Date/Time   COLORURINE YELLOW 03/23/2016 0130   APPEARANCEUR CLOUDY* 03/23/2016 0130   LABSPEC 1.014 03/23/2016 0130   PHURINE 5.5 03/23/2016 0130   GLUCOSEU NEGATIVE 03/23/2016 0130   HGBUR NEGATIVE 03/23/2016 0130   BILIRUBINUR NEGATIVE 03/23/2016 0130   KETONESUR NEGATIVE 03/23/2016 0130   PROTEINUR NEGATIVE 03/23/2016 0130   UROBILINOGEN 0.2 11/06/2007 2042   NITRITE NEGATIVE 03/23/2016 0130   LEUKOCYTESUR NEGATIVE 03/23/2016 0130   Sepsis Labs: @LABRCNTIP (procalcitonin:4,lacticidven:4) )No results found for this or any previous visit (from the past 240 hour(s)).   Radiological Exams on Admission: Dg Chest 2 View  03/30/2016  CLINICAL DATA:  Seizures tonight. History of seizures. Shortness of breath. EXAM: CHEST  2 VIEW COMPARISON:  03/01/2016 FINDINGS: Normal heart size and pulmonary vascularity. Linear scarring or atelectasis in the left lower lung. No focal airspace disease or consolidation. No blunting of costophrenic angles. No pneumothorax. Mediastinal contours appear intact. Old resection or  resorption of the distal right clavicle. Degenerative changes in the spine and shoulders. IMPRESSION: No active cardiopulmonary disease. Electronically Signed   By: Lucienne Capers M.D.   On: 03/30/2016 02:07    EKG: Independently reviewed.  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  Doubt that the bystolic is the root cause though as he had been on it for "a long time" prior to the episodes starting in March of this year.     DVT prophylaxis: lovenox Code Status: Full Family Communication: Wife at bedside Consults called: Spoke with neurology who is evaluating patient Admission status: Admit to obs   Elvert Cumpton, [place] [facility] Pager [phone] from 7PM-7AM  If 7AM-7PM, please contact the day physician for the patient [email or web address] Password [email or web address]  03/30/2016, 3:00 AM

## 2016-03-30 NOTE — Consult Note (Signed)
Primary cardiologist: N/A Consulting cardiologist: Dr Carlyle Dolly Requesting physician: Dr Bonnielee Haff Reason for consult: Altered mentals status, apnea  Clinical Summary Mr. Deveny is a 55 y.o.male with a history of HTN, hyperliidmeia, and long history of episodes of altered mental status and apnea. He has been evaluated by neurology for syncope, and is currently on antiseizure medications however it has not been convincing based on extensive neurological workup that the episodes truly are neurological in origin. Spells often involve slow labored breathing, with previous intubation required for airway protection. Cardiology is consulted to see if there may be any cardiac component of symptoms  He states for the last 2 months he has had these intermittent episodes. He has no memory of them, is not able to describe any symptoms. His wife reports they occur about 3-4 times a week. He will be come altered, lethargic, and slow in his response and speaking. His breathing rate can decrease and often his home O2 sats decrease during these episodes. He has not had true syncope. No particular pattern of onset for symptoms. He reports occasional mild palpitations in general but nothing significant. No other cardiac symptoms, remains active including playing tennis regularly without troubles.      02/2016 echo: LVEF 0000000, grade I diastolic dysfunction. No significant valve dysfunction Trop neg, K 4.3, Cr 0.85, Hgb 12.3, Plt 162,  EKG NSR. All EKGs reviewed, all show NSR   Allergies  Allergen Reactions  . Morphine And Related Nausea And Vomiting    Medications Scheduled Medications: . atorvastatin  20 mg Oral Daily  . divalproex  500 mg Oral BID  . DULoxetine  30 mg Oral BID  . enoxaparin (LOVENOX) injection  40 mg Subcutaneous Q24H  . levETIRAcetam  1,500 mg Oral BID  . multivitamin with minerals  1 tablet Oral Daily  . nebivolol  5 mg Oral Daily  . sodium chloride flush  3 mL  Intravenous Q12H     Infusions:     PRN Medications:  diazepam   Past Medical History  Diagnosis Date  . Hypertension   . Hyperlipidemia   . Anxiety   . Arthritis   . Seizures [facility])     Past Surgical History  Procedure Laterality Date  . Anterior cruciate ligament repair  1989, 2007    left  . Shoulder surgery  1999    right  . Ganglion cyst excision  1998    right wrist  . Removal toe nail  1993    right great toe  . Knee arthroscopy with medial menisectomy Right 09/19/2015    Procedure: RIGHT KNEE ARTHROSCOPY WITH PARTIAL MEDIAL MENISCECTOMY;  Surgeon: Leandrew Koyanagi, MD;  Location: [place];  Service: Orthopedics;  Laterality: Right;    Family History  Problem Relation Age of Onset  . Colon cancer Neg Hx   . Stomach cancer Neg Hx   . Diabetes Father   . Heart disease Father   . Hypertension Father   . Alzheimer's disease Mother   . Alzheimer's disease Maternal Grandmother     Social History Mr. Delatte reports that he has never smoked. He has never used smokeless tobacco. Mr. Breyer reports that he does not drink alcohol.  Review of Systems CONSTITUTIONAL: No weight loss, fever, chills, weakness or fatigue.  HEENT: Eyes: No visual loss, blurred vision, double vision or yellow sclerae. No hearing loss, sneezing, congestion, runny nose or sore throat.  SKIN: No rash or itching.  CARDIOVASCULAR: No chest  pain, chest pressure or chest discomfort. No palpitations or edema.  RESPIRATORY: No shortness of breath, cough or sputum.  GASTROINTESTINAL: No anorexia, nausea, vomiting or diarrhea. No abdominal pain or blood.  GENITOURINARY: no polyuria, no dysuria NEUROLOGICAL: per HPI MUSCULOSKELETAL: No muscle, back pain, joint pain or stiffness.  HEMATOLOGIC: No anemia, bleeding or bruising.  LYMPHATICS: No enlarged nodes. No history of splenectomy.  PSYCHIATRIC: No history of depression or anxiety.      Physical Examination Blood pressure  112/60, pulse 56, temperature 97.7 F (36.5 C), temperature source Oral, resp. rate 18, height 6' (1.829 m), weight 201 lb 12.8 oz (91.536 kg), SpO2 97 %. No intake or output data in the 24 hours ending 03/30/16 1055  HEENT: sclera clear, throat clear  Cardiovascular: RRR, no m/r/g, no jvd  Respiratory: CTAB  GI: abdomen soft, NT, ND  MSK: no LE edema  Neuro: no focal deficits  Psych: appropriate affect   Lab Results  Basic Metabolic Panel:  Recent Labs Lab 03/28/16 1015 03/30/16 0027  NA 137 141  K 4.7 4.3  CL 102 102  CO2 30 31  GLUCOSE 93 96  BUN 12 13  CREATININE 0.84 0.85  CALCIUM 9.4 8.7*    Liver Function Tests:  Recent Labs Lab 03/28/16 1015  AST 18  ALT 17  ALKPHOS 46  BILITOT 0.6  PROT 6.6  ALBUMIN 4.6    CBC:  Recent Labs Lab 03/28/16 1015 03/30/16 0027  WBC 4.1 4.5  NEUTROABS 2.1 2.5  HGB 13.9 12.3*  HCT 41.0 37.7*  MCV 95.3 95.0  PLT 204.0 162    Cardiac Enzymes:  Recent Labs Lab 03/30/16 0027  TROPONINI <0.03    BNP: Invalid input(s): POCBNP      Impression/Recommendations 1. Altered mental status/ apnea - symptoms do not fit a cardiac etiology. He has transient episodes of severe lethargy, slow verbal response, and apnea with related hypoxia. He has not had classic true syncope. - echo shows no evidence of structural heart disease - EKG and telemetry review show only mild sinus bradycardia at times, this would not explain these episodes - would recommend 2 week event monitor to verify no evidence of arrhythmia during episodes. It appears the patient has seen Dr [name] within the last year, he may have this done with him or if he wishes can f/u with [place] as outpatient to arrange. I have asked him to let the team know his preference prior to discharge.  - no further plans for cardiac testing at this time. We will sign off inpatient care. If patient wishes to f/u with [place] please contact us at time of discharge.           Carlyle Dolly, M.D.

## 2016-03-30 NOTE — Consult Note (Signed)
Name: Nathan Ballard MRN: [id number] DOB: 30-Oct-1961    ADMISSION DATE:  03/29/2016 CONSULTATION DATE:  03/30/2016  REFERRING MD :  [place] [facility]  CHIEF COMPLAINT:  "Spells"   HISTORY OF PRESENT ILLNESS:  55 year old male never smoker being evaluated for recurrent episodes of decreased responsiveness, apnea with oxygen desaturation, lethargy. EMS described transit confusion, bradycardia and bradypnea with these episodes. Detailed description is available in the record. The patient Point to an obvious trigger but says many of these episodes with same pattern happened while he is sleeping. He is said to gasp loudly at the outset and then be hard to awaken. A sleep study was done by his primary physician about 5 years ago as an unattended home study and reportedly did not show enough sleep apnea to treat. He does not routinely snore. He has no recognized lung disease. Previous neurology evaluation has diagnosed partial complex seizures for which he has been treated.  SIGNIFICANT EVENTS    STUDIES:  24 EEG 5/21  PAST MEDICAL HISTORY :   has a past medical history of Hypertension; Hyperlipidemia; Anxiety; Arthritis; and Seizures ([place]).  has past surgical history that includes Anterior cruciate ligament repair (1989, 2007); Shoulder surgery (1999); Ganglion cyst excision (1998); removal toe nail (1993); and Knee arthroscopy with medial menisectomy (Right, 09/19/2015). Prior to Admission medications   Medication Sig Start Date End Date Taking? Authorizing Provider  atorvastatin (LIPITOR) 40 MG tablet Take 20 mg by mouth daily.  08/18/12  Yes Historical Provider, MD  diazepam (DIASAT) 20 MG GEL Place 20 mg rectally once. For breathing trouble or seizure lasting >5 min. 03/22/16  Yes Deno Etienne, DO  divalproex (DEPAKOTE) 250 MG DR tablet Take 2 tablets (500 mg total) by mouth 2 (two) times daily. 03/23/16  Yes Gibson Ramp, MD  DULoxetine (CYMBALTA) 30 MG capsule Take 30 mg by mouth 2 (two) times  daily. 02/22/16  Yes Historical Provider, MD  levETIRAcetam (KEPPRA) 750 MG tablet Take 2 tablets (1,500 mg total) by mouth 2 (two) times daily. 03/04/16  Yes Robbie Lis, MD  Misc Natural Products (OSTEO BI-FLEX JOINT SHIELD) TABS Take 2 tablets by mouth daily.   Yes Historical Provider, MD  Multiple Vitamin (MULTIVITAMIN) tablet Take 1 tablet by mouth daily.   Yes Historical Provider, MD  nebivolol (BYSTOLIC) 10 MG tablet Take 10 mg by mouth daily.   Yes Historical Provider, MD   Allergies  Allergen Reactions  . Morphine And Related Nausea And Vomiting    FAMILY HISTORY:  family history includes Alzheimer's disease in his maternal grandmother and mother; Diabetes in his father; Heart disease in his father; Hypertension in his father. There is no history of Colon cancer or Stomach cancer. SOCIAL HISTORY:  reports that he has never smoked. He has never used smokeless tobacco. He reports that he does not drink alcohol or use illicit drugs.  REVIEW OF SYSTEMS:  += pos Constitutional: Negative for fever, chills, weight loss, malaise/fatigue and diaphoresis.  HENT: Negative for hearing loss, ear pain, nosebleeds, congestion, sore throat, neck pain, tinnitus and ear discharge.   Eyes: Negative for blurred vision, double vision, photophobia, pain, discharge and redness.  Respiratory: Negative for cough, hemoptysis, sputum production, shortness of breath, wheezing and stridor.   Cardiovascular: Negative for chest pain, palpitations, orthopnea, claudication, leg swelling and PND.  Gastrointestinal: Negative for heartburn, nausea, vomiting, abdominal pain, diarrhea, constipation, blood in stool and melena.  Genitourinary: Negative for dysuria, urgency, frequency, hematuria and flank pain.  Musculoskeletal:  Negative for myalgias, back pain, joint pain and falls.  Skin: Negative for itching and rash.  Neurological: + per HPI-dizziness, tingling, tremors, sensory change, speech change, focal weakness,  +seizures, + loss of consciousness, weakness and headaches.  Endo/Heme/Allergies: Negative for environmental allergies and polydipsia. Does not bruise/bleed easily.  SUBJECTIVE:   VITAL SIGNS: Temp:  [97.7 F (36.5 C)-98.2 F (36.8 C)] 98 F (36.7 C) (05/21 1500) Pulse Rate:  [51-78] 56 (05/21 0402) Resp:  [14-20] 18 (05/21 0402) BP: (95-113)/(44-69) 112/60 mmHg (05/21 0402) SpO2:  [93 %-99 %] 99 % (05/21 1500) Weight:  [87.998 kg (194 lb)-91.536 kg (201 lb 12.8 oz)] 91.536 kg (201 lb 12.8 oz) (05/21 0402)  PHYSICAL EXAMINATION: General: Calm, fit-appearing, pleasant and cooperative man lying in bed. 24 hour EEG is running Neuro:  Nonfocal. Alert, fully oriented, thought process appropriate HEENT: Gross vision and speech are normal, no stridor or bruit Cardiovascular:  RRR, no m/g/r Lungs:  Trace wheeze right base, unlabored, no cough Abdomen:  Nontender without organomegaly, bowel sounds active Musculoskeletal:  Good muscle bulk, symmetrical Skin:  No rash or bruising   Recent Labs Lab 03/28/16 1015 03/30/16 0027  NA 137 141  K 4.7 4.3  CL 102 102  CO2 30 31  BUN 12 13  CREATININE 0.84 0.85  GLUCOSE 93 96    Recent Labs Lab 03/28/16 1015 03/30/16 0027  HGB 13.9 12.3*  HCT 41.0 37.7*  WBC 4.1 4.5  PLT 204.0 162   Dg Chest 2 View  03/30/2016  CLINICAL DATA:  Seizures tonight. History of seizures. Shortness of breath. EXAM: CHEST  2 VIEW COMPARISON:  03/01/2016 FINDINGS: Normal heart size and pulmonary vascularity. Linear scarring or atelectasis in the left lower lung. No focal airspace disease or consolidation. No blunting of costophrenic angles. No pneumothorax. Mediastinal contours appear intact. Old resection or resorption of the distal right clavicle. Degenerative changes in the spine and shoulders. IMPRESSION: No active cardiopulmonary disease. Electronically Signed   By: Lucienne Capers M.D.   On: 03/30/2016 02:07    ASSESSMENT / PLAN:  Seizure  disorder-drops in oxygen saturation more likely result from central apnea or hypoventilation associated with a primary neurologic event, but that has not been proven. He says most of his episodes have happened during sleep which invites possibility of occasional sleep apnea triggering the seizure, or sleep associated seizures. Otherwise the degree of confusion and postictal description would not be characteristic of ordinary obstructive sleep apnea. He does not seem to be describing tussive syncope.  Recommend:( if current neurology workup is inconclusive) 1) complete PFT to define pulmonary status 2) Nocturnal Polysomnogram with seizure montage as an attended sleep study  Please call if Pulmonary can be of further help  CD Ellyn Rubiano M.D. Pulmonary and [place] Pager: [phone]  03/30/2016, 4:29 PM

## 2016-03-30 NOTE — Progress Notes (Signed)
Day 1 LTM started, explained the event button to patients wife and she understood to push button if patient has an event.

## 2016-03-30 NOTE — Care Management (Signed)
Patient seen by on call neurologist earlier this morning, as described in consultation note.  Started long-term EEG monitoring for spell characterization. After discussing with patient and his wife, discontinued both Keppra and Depakote. Patient previously had LTM EEG monitoring for 48 hours while on these medicines and was reportedly normal. Hence per their request, seizure medicines were discontinued for EEG monitoring.   Dr. Leonel Ramsay will follow-up tomorrow morning.

## 2016-03-30 NOTE — ED Notes (Signed)
Patient transported to X-ray 

## 2016-03-30 NOTE — Consult Note (Signed)
Reason:  ?apneic seizures  HPI:  55 yo WM with recurrent episodes of apnea like events with hypoxia to 70's.  He has had a dozen events so far since 01/2016.  Most of the time it happens while sleeping and witnesses are alerted to his gasping for air.  Occasionally it happens while awake and associated with some alteration of consciousness and staring beforehand.  No clear GTCS witnessed, although one occasion he was tremulous throughout while standing without falling and talking during it.  On one occasion he bit his tongue.  No incontinence.  He had an event earlier today despite being placed on maximum Keppra 3000 mg/day and Depakote.  Prior MRI Brain 02/2016 was normal.  Prior EEGs have been normal, although never captured an event on EEG.  He has not seen a cardiologist or pulmonologist since onset.  TTE was normal 02/2016.  Cerebral angio was normal 02/2016.  Today, CBC, BMP, cardiac enzymes, CXR, and ECG are normal.  VPA level was 60 in 03/28/2016.  ABG showed 7.36/50/72/28/93  Meds: Keppra 1500 mg bid, Depakote 500 mg bid, Vicodin prn knee pain (denies any overdose on that)  Neuro exam:  Completely normal and non-focal.  113/63  P 78 T98   A/P:  Unusual events which may represent complex partial seizures +/- secondary generalization unwitnessed with autonomic involvement and respiratory depression.  Other considerations would be primary cardiac arrhythmia, pulmonary disorder, or sleep apnea.  Conversion disorder is also in the differential.    Ultimately, we need to capture an event on video-EEG to determine if this is seizure related.  I am skeptical because events are happening despite very high doses of 2 AEDs.  Patient needs to be referred to an Epilepsy Monitoring unit or at least one week of ambulatory video-EEG monitoring at home.  We can do continuous EEG here in the mean time.    He will benefit from 30 day holter monitoring and spirometry as well.  Polysomnography can be considered as  outpatient in the future if all other avenues are unproductive.

## 2016-03-31 ENCOUNTER — Telehealth: Payer: Self-pay | Admitting: Neurology

## 2016-03-31 ENCOUNTER — Telehealth: Payer: Self-pay

## 2016-03-31 DIAGNOSIS — I10 Essential (primary) hypertension: Secondary | ICD-10-CM

## 2016-03-31 DIAGNOSIS — R6889 Other general symptoms and signs: Secondary | ICD-10-CM | POA: Diagnosis not present

## 2016-03-31 DIAGNOSIS — R569 Unspecified convulsions: Secondary | ICD-10-CM

## 2016-03-31 LAB — COMPREHENSIVE METABOLIC PANEL
ALBUMIN: 3.7 g/dL (ref 3.5–5.0)
ALK PHOS: 43 U/L (ref 38–126)
ALT: 15 U/L — ABNORMAL LOW (ref 17–63)
ANION GAP: 6 (ref 5–15)
AST: 18 U/L (ref 15–41)
BILIRUBIN TOTAL: 0.5 mg/dL (ref 0.3–1.2)
BUN: 13 mg/dL (ref 6–20)
CALCIUM: 8.9 mg/dL (ref 8.9–10.3)
CO2: 28 mmol/L (ref 22–32)
CREATININE: 0.75 mg/dL (ref 0.61–1.24)
Chloride: 106 mmol/L (ref 101–111)
GFR calc Af Amer: >60 mL/min (ref >60)
GFR calc non Af Amer: >60 mL/min (ref >60)
GLUCOSE: 94 mg/dL (ref 65–99)
Potassium: 4 mmol/L (ref 3.5–5.1)
Sodium: 140 mmol/L (ref 135–145)
TOTAL PROTEIN: 5.7 g/dL — AB (ref 6.5–8.1)

## 2016-03-31 LAB — CBC
HEMATOCRIT: 40.4 % (ref 39.0–52.0)
HEMOGLOBIN: 13.3 g/dL (ref 13.0–17.0)
MCH: 31 pg (ref 26.0–34.0)
MCHC: 32.9 g/dL (ref 30.0–36.0)
MCV: 94.2 fL (ref 78.0–100.0)
Platelets: 192 10*3/uL (ref 150–400)
RBC: 4.29 MIL/uL (ref 4.22–5.81)
RDW: 12.2 % (ref 11.5–15.5)
WBC: 5.1 10*3/uL (ref 4.0–10.5)

## 2016-03-31 NOTE — Progress Notes (Signed)
[facility] PROGRESS NOTE  Nathan Ballard [id number] DOB: 1961-09-14 DOA: 03/29/2016  PCP: Jani Gravel, MD  Brief HPI: 55 year old Caucasian male with a past medical history of hypertension, hyperlipidemia , started having shaking episodes, as well as periods of apnea. This has been ongoing for the last 2 months or so. Patient was seen by neurology. There was concern about seizure activity and he was placed on antiepileptics. He's had multiple EEGs which did not show any epileptiform activity. Patient presented last night with similar episodes. He was hospitalized for further management.  Past medical history:  Past Medical History  Diagnosis Date  . Hypertension   . Hyperlipidemia   . Anxiety   . Arthritis   . Seizures [facility])     Consultants: Neurology. Cardiology. Pulmonology  Procedures: Long-term EEG monitoring is ongoing  Antibiotics: None  Subjective: Patient feels well this morning. He denies any further episodes of the "spells" that he has been experiencing at home. His wife is at the bedside.   Objective:  Vital Signs  Filed Vitals:   03/30/16 0402 03/30/16 1500 03/30/16 1958 03/31/16 0611  BP: 112/60  127/73 119/79  Pulse: 56  66 62  Temp: 97.7 F (36.5 C) 98 F (36.7 C) 98.2 F (36.8 C) 97.9 F (36.6 C)  TempSrc: Oral Oral Oral Oral  Resp: 18  18 18   Height: 6' (1.829 m)     Weight: 91.536 kg (201 lb 12.8 oz)     SpO2: 97% 99% 99% 96%    Intake/Output Summary (Last 24 hours) at 03/31/16 0750 Last data filed at 03/31/16 0543  Gross per 24 hour  Intake      0 ml  Output    880 ml  Net   -880 ml   Filed Weights   03/29/16 2352 03/30/16 0402  Weight: 87.998 kg (194 lb) 91.536 kg (201 lb 12.8 oz)    General appearance: alert, cooperative, appears stated age and no distress Resp: clear to auscultation bilaterally Cardio: regular rate and rhythm, S1, S2 normal, no murmur, click, rub or gallop GI: soft, non-tender; bowel sounds normal;  no masses,  no organomegaly Neurologic: Awake and alert. Oriented 3. Cranial nerves II-12 intact. Strength equal bilateral upper and lower extremities.  Lab Results:  Data Reviewed: I have personally reviewed following labs and imaging studies  CBC:  Recent Labs Lab 03/28/16 1015 03/30/16 0027 03/31/16 0219  WBC 4.1 4.5 5.1  NEUTROABS 2.1 2.5  --   HGB 13.9 12.3* 13.3  HCT 41.0 37.7* 40.4  MCV 95.3 95.0 94.2  PLT 204.0 162 [id number]   Basic Metabolic Panel:  Recent Labs Lab 03/28/16 1015 03/30/16 0027 03/31/16 0219  NA 137 141 140  K 4.7 4.3 4.0  CL 102 102 106  CO2 30 31 28   GLUCOSE 93 96 94  BUN 12 13 13   CREATININE 0.84 0.85 0.75  CALCIUM 9.4 8.7* 8.9   GFR: Estimated Creatinine Clearance: 115.9 mL/min (by C-G formula based on Cr of 0.75).  Liver Function Tests:  Recent Labs Lab 03/28/16 1015 03/31/16 0219  AST 18 18  ALT 17 15*  ALKPHOS 46 43  BILITOT 0.6 0.5  PROT 6.6 5.7*  ALBUMIN 4.6 3.7   Cardiac Enzymes:  Recent Labs Lab 03/30/16 0027  TROPONINI <0.03   Urine analysis:    Component Value Date/Time   COLORURINE YELLOW 03/23/2016 0130   APPEARANCEUR CLOUDY* 03/23/2016 0130   LABSPEC 1.014 03/23/2016 0130   PHURINE 5.5 03/23/2016 0130  GLUCOSEU NEGATIVE 03/23/2016 0130   HGBUR NEGATIVE 03/23/2016 0130   BILIRUBINUR NEGATIVE 03/23/2016 0130   KETONESUR NEGATIVE 03/23/2016 0130   PROTEINUR NEGATIVE 03/23/2016 0130   UROBILINOGEN 0.2 11/06/2007 2042   NITRITE NEGATIVE 03/23/2016 0130   LEUKOCYTESUR NEGATIVE 03/23/2016 0130     Radiology Studies: Dg Chest 2 View  03/30/2016  CLINICAL DATA:  Seizures tonight. History of seizures. Shortness of breath. EXAM: CHEST  2 VIEW COMPARISON:  03/01/2016 FINDINGS: Normal heart size and pulmonary vascularity. Linear scarring or atelectasis in the left lower lung. No focal airspace disease or consolidation. No blunting of costophrenic angles. No pneumothorax. Mediastinal contours appear intact. Old  resection or resorption of the distal right clavicle. Degenerative changes in the spine and shoulders. IMPRESSION: No active cardiopulmonary disease. Electronically Signed   By: Lucienne Capers M.D.   On: 03/30/2016 02:07     Medications:  Scheduled: . atorvastatin  20 mg Oral Daily  . DULoxetine  30 mg Oral BID  . enoxaparin (LOVENOX) injection  40 mg Subcutaneous Q24H  . multivitamin with minerals  1 tablet Oral Daily  . nebivolol  5 mg Oral Daily  . 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 Initially thought to be epileptiform and was placed on antiepileptic drugs. However, patient continued to get these episodes at home. He is currently undergoing long-term EEG monitoring. Seen by cardiology and pulmonology. He will be set up for outpatient, pulmonary function test as well as sleep study. 2 week Holter monitoring will also need to be arranged. (Dr. [name] is his cardiologist). Discussed with Dr. Leonel Ramsay this morning. We will also initiate continuous pulse oximetry monitoring. He is currently off of his antiepileptics while undergoing the long-term monitoring. Patient has had extensive workup in the past including MRI brain, echocardiogram, multiple EEGs. He reports having had a sleep study about 2 years ago and was not placed on CPAP at that time.   History of essential hypertension Continue nebivolol. He is noted to be slightly bradycardic, although he is stable. Blood pressure is reasonably well controlled. Orthostatics were checked and her blood pressure remained stable as did his heart rate.    DVT Prophylaxis: Lovenox    Code Status: Full code  Family Communication:  Discussed with the patient and his wife  Disposition Plan: Patient continues to be on long term monitoring. Continuous pulse oximetry to be initiated as well.      [facility] Pager [phone] 03/31/2016, 7:50 AM  If 7PM-7AM, please contact night-coverage at [email or web address], password [email or web address]

## 2016-03-31 NOTE — Telephone Encounter (Signed)
VM-PT left a message in regards to [place] referral/Dawn CB# [phone]

## 2016-03-31 NOTE — Telephone Encounter (Signed)
VM-PT left message in regards to [facility] Referral/Dawn

## 2016-03-31 NOTE — Progress Notes (Signed)
Subjective: No events overnight  Exam: Filed Vitals:   03/31/16 1003 03/31/16 1255  BP: 148/89 134/83  Pulse:  69  Temp:  98 F (36.7 C)  Resp:  17   Gen: In bed, NAD Resp: non-labored breathing, no acute distress Abd: soft, nt  Neuro: MS: Awake, alert CN: Face symmetric, EOMI Motor: Moves all extremities well Sensory: Intact to light touch  Impression: 55 year old male with recurrent episodes of altered mental status in the setting of hypoxia. Stability is include some other driver causing intermittent hypoxia with subsequent encephalopathy due to hypoxia versus partial complex seizures causing altered mental status and slowed respirations (unusual) leading to hypoxia.  He had no benefit with 2 antiepileptics and did have side effects which I think are mostly attributable to the [place]. We are currently holding these due to trying to capture an episode.  He has been referred to [facility] for formal EMG study, but due to recurrent episodes that admitted. In this setting, we are trying to capture an episode for further definitive identification to guide our therapy.  Recommendations: 1) continuous pulse oximetry 2) continuous EEG 3) if unable to capture spells, then may need repeat study at [place], MD [facility] [phone]  If 7pm- 7am, please page neurology on call as listed in [place].

## 2016-03-31 NOTE — Telephone Encounter (Signed)
-----   Message from Pieter Partridge, DO sent at 03/31/2016  6:56 AM EDT ----- All labs look okay

## 2016-03-31 NOTE — Procedures (Signed)
Electroencephalogram report- LTM  Ordering Physician : Dr. Silverio Decamp EEG number: [phone]    Beginning date or time: 03/30/2016 12:30PM Ending date or time: 03/31/2016 10:30AM  Day of study: day 1  Medications include: Per EMR  HISTORY: This 24 hours of intensive EEG monitoring with simultaneous video monitoring was performed for this patient with seizure like events. This EEG was requested for characterization to guide medical management.  TECHNICAL DESCRIPTION:  The study consists of a continuous 16-channel multi-montage digital video EEG recording with twenty-one electrodes placed according to the International 10-20 System. Additional leads included eye leads, true temporal leads (T1, T2), and an EKG lead.   REPORT: The background activity in this tracing consists of about 9Hz  posterior dominant rhythm. The activity is symmetric bilaterally, reactive to eye opening and stimulation. Drowsiness and sleep were manifested by background fragmentation, vertex waves and sleep spindles. No focal slowing or epileptiform activity was seen.   There were no push button events during the monitoring.   IMPRESSION: This EEG shows: No focal or paroxysmal activity  CLINICAL CORRELATION: No electrographic seizures were seen.

## 2016-04-01 ENCOUNTER — Telehealth: Payer: Self-pay

## 2016-04-01 ENCOUNTER — Telehealth: Payer: Self-pay | Admitting: Neurology

## 2016-04-01 DIAGNOSIS — R6889 Other general symptoms and signs: Secondary | ICD-10-CM | POA: Diagnosis not present

## 2016-04-01 DIAGNOSIS — IMO0001 Reserved for inherently not codable concepts without codable children: Secondary | ICD-10-CM

## 2016-04-01 DIAGNOSIS — R569 Unspecified convulsions: Secondary | ICD-10-CM | POA: Diagnosis not present

## 2016-04-01 MED ORDER — GABAPENTIN 300 MG PO CAPS: 300.0000 mg | ORAL_CAPSULE | Freq: Three times a day (TID) | ORAL | Status: DC

## 2016-04-01 NOTE — Telephone Encounter (Signed)
I would schedule him for 72 hour ambulatory EEG.  If we are not able to capture an event, then I would proceed with EMU referral.

## 2016-04-01 NOTE — Discharge Summary (Signed)
[facility]  Physician Discharge Summary   Patient ID: Nathan Ballard MRN: [id number] DOB/AGE: [birth year] 55 y.o.  Admit date: 03/29/2016 Discharge date: 04/01/2016  PCP: Jani Gravel, MD  DISCHARGE DIAGNOSES:  Principal Problem:   Spells [facility]) Active Problems:   Seizures ([place])   Acute respiratory failure with hypoxia ([place])   Complex partial epilepsy ([place])   Syncope   RECOMMENDATIONS FOR OUTPATIENT FOLLOW UP: 1. Patient is being set up for ambulatory EEG 2. Outpatient sleep study and PFTs have been ordered 3. His cardiologist to arrange Holter monitoring   DISCHARGE CONDITION: fair  Diet recommendation: As before  [facility] Weights   03/29/16 2352 03/30/16 0402  Weight: 87.998 kg (194 lb) 91.536 kg (201 lb 12.8 oz)    INITIAL HISTORY: 55 year old Caucasian male with a past medical history of hypertension, hyperlipidemia , started having shaking episodes, as well as periods of apnea. This has been ongoing for the last 2 months or so. Patient was seen by neurology. There was concern about seizure activity and he was placed on antiepileptics. He's had multiple EEGs which did not show any epileptiform activity. Patient presented last night with similar episodes. He was hospitalized for further management.  Consultants: Neurology. Cardiology. Pulmonology  Procedures: Long-term EEG monitoring   HOSPITAL COURSE:   Unresponsiveness/syncope/apneic episodes Etiology for his symptoms is not clear. Initially thought to be epileptiform and was placed on antiepileptic drugs. However, patient continued to get these episodes at home. He was again admitted to the hospital. In by neurology. Long-term monitoring with EEG was initiated. No epileptiform activity was noted. Continuous pulse oximetry was initiated. Patient was also seen by pulmonology and cardiology. Outpatient primary function tests and sleep study has been ordered. Holter monitoring to be arranged by cardiology (Dr.  [name]). Patient wishes to go home today as his outpatient nephrologist is arranging for an ambulatory EEG. Discussed with Dr. Leonel Ramsay with neurology. He recommends discontinuing Keppra due to side effects. Continuing Depakote. And then starting him on gabapentin after he has completed the ambulatory EEG. Discussed with the patient and his wife. They are agreeable with this plan.  History of essential hypertension Continue nebivolol. He is noted to be slightly bradycardic, although he is stable. Blood pressure is reasonably well controlled. Orthostatics were checked and blood pressure remained stable as did his heart rate.   Overall he is stable. We still don't have a clear reason for his symptoms. Management as discussed above. Okay for discharge.   PERTINENT LABS:  The results of significant diagnostics from this hospitalization (including imaging, microbiology, ancillary and laboratory) are listed below for reference.      Labs: Basic Metabolic Panel:  Recent Labs Lab 03/28/16 1015 03/30/16 0027 03/31/16 0219  NA 137 141 140  K 4.7 4.3 4.0  CL 102 102 106  CO2 30 31 28   GLUCOSE 93 96 94  BUN 12 13 13   CREATININE 0.84 0.85 0.75  CALCIUM 9.4 8.7* 8.9   Liver Function Tests:  Recent Labs Lab 03/28/16 1015 03/31/16 0219  AST 18 18  ALT 17 15*  ALKPHOS 46 43  BILITOT 0.6 0.5  PROT 6.6 5.7*  ALBUMIN 4.6 3.7   CBC:  Recent Labs Lab 03/28/16 1015 03/30/16 0027 03/31/16 0219  WBC 4.1 4.5 5.1  NEUTROABS 2.1 2.5  --   HGB 13.9 12.3* 13.3  HCT 41.0 37.7* 40.4  MCV 95.3 95.0 94.2  PLT 204.0 162 192   Cardiac Enzymes:  Recent Labs Lab 03/30/16 0027  TROPONINI <  0.03    IMAGING STUDIES Dg Chest 2 View  03/30/2016  CLINICAL DATA:  Seizures tonight. History of seizures. Shortness of breath. EXAM: CHEST  2 VIEW COMPARISON:  03/01/2016 FINDINGS: Normal heart size and pulmonary vascularity. Linear scarring or atelectasis in the left lower lung. No focal airspace  disease or consolidation. No blunting of costophrenic angles. No pneumothorax. Mediastinal contours appear intact. Old resection or resorption of the distal right clavicle. Degenerative changes in the spine and shoulders. IMPRESSION: No active cardiopulmonary disease. Electronically Signed   By: Lucienne Capers M.D.   On: 03/30/2016 02:07   Ir Angio Intra Extracran Sel Com Carotid Innominate Bilat Mod Sed  03/10/2016  INDICATION: History of new onset seizures. Abnormal CT angiogram of the neck revealing prominence of vessels supplying the tongue on the left side. EXAM: IR ANGIO INTRA EXTRACRAN SEL COM CAROTID INNOMINATE UNI RIGHT MOD SED BILATERAL COMMON CAROTID ARTERY, LEFT EXTERNAL CAROTID ARTERY, AND BILATERAL VERTEBRAL ARTERY ANGIOGRAMS: COMPARISON:  CT angiogram of the head and neck of 03/01/2016. MEDICATIONS: None. ANESTHESIA/SEDATION: Versed 2 mg IV; Fentanyl 50 mcg IV. Moderate Sedation Time:  40 minutes. The patient was continuously monitored during the procedure by the interventional radiology nurse under my direct supervision. CONTRAST:  Omnipaque 300 approximately 60 mL FLUOROSCOPY TIME:  Fluoroscopy Time: 20 minutes 42 seconds (1339 mGy). COMPLICATIONS: None immediate. TECHNIQUE: Informed written consent was obtained from the patient after a thorough discussion of the procedural risks, benefits and alternatives. All questions were addressed. Maximal Sterile Barrier Technique was utilized including caps, mask, sterile gowns, sterile gloves, sterile drape, hand hygiene and skin antiseptic. A timeout was performed prior to the initiation of the procedure. The right groin was prepped and draped in the usual sterile fashion. Thereafter using modified Seldinger technique, transfemoral access into the right common femoral artery was obtained without difficulty. Over a 0.035 inch guidewire a 5 French Pinnacle sheath was inserted. Through this, and also over a 0.035 inch guidewire a 5 [place] JB 1 catheter was  advanced to the aortic arch region and selectively positioned in the right vertebral artery, right common carotid artery, the left common carotid artery, the left external carotid artery and the left vertebral artery. The patient tolerated the procedure well. There were no acute complications. FINDINGS: The right common carotid arteriogram demonstrates the right external carotid artery and its major branches to be widely patent. The right internal carotid artery at the bulb demonstrates smooth shallow plaque along the posterior wall. No acute ulcerations are seen. The vessel is otherwise seen to opacify to the cranial skull base due to minimal tortuosity in its mid cervical region. The cavernous and the supraclinoid segments are widely patent. The right middle cerebral artery and the right anterior cerebral artery opacify into the capillary and the venous phases. The delayed capillary phase and the early venous phase demonstrates a wedge-shaped area of numerous venules which then converge to a large prominent smooth vein which drains into the junction of the anterior and the middle one thirds of the superior sagittal sinus. This is most consistent with a developmental venous anomaly. The right vertebral artery origin is normal. The vessel is seen to opacify normally to the cranial skull base. Normal opacification is seen of the right vertebrobasilar junction and the right posterior inferior cerebellar artery. The basilar artery, the posterior cerebral artery, superior cerebellar arteries and the anterior-inferior cerebellar arteries opacify normally into the capillary and the venous phases. Non-opacified blood is seen in the basilar artery from the  contralateral vertebral artery. 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 The left common carotid arteriogram demonstrates the left external carotid artery and its major branches to be widely patent. The left internal carotid artery at the  bulb to the cranial skull base opacifies normally. The petrous, cavernous and the supraclinoid segments are widely patent. Left posterior communicating artery is seen opacifying the left posterior cerebral artery distribution. The left middle cerebral artery and the left anterior cerebral artery opacify normally into the capillary and the venous phases. A selective left external carotid arteriogram demonstrates asymmetric prominence of the left lingual artery, the left facial artery, and also transverse facial branches from the internal maxillary artery. There is no evidence of early venous opacification of any of these branches mentioned. Contrast blush is noted in the region of the superior meatus of the nasal cavity which may represent inflammatory reaction. The left vertebral artery origin is normal. The vessel is seen to opacify to the cranial skull base. Opacification of the left posterior inferior cerebellar artery and the left vertebrobasilar junction is noted. The basilar artery, the posterior cerebral arteries superior cerebellar arteries and the anterior-inferior cerebellar arteries opacify into the capillary and the venous phases. IMPRESSION: Asymmetric prominence of the left lingual artery, left facial artery, and left internal maxillary artery transverse facial branches. However no evidence of arteriovenous shunting is noted in any of the above distributions. Moderate-sized developmental venous anomaly of the right mid frontal subcortical area as described above. Electronically Signed   By: Luanne Bras M.D.   On: 03/04/2016 12:20   Ir Angio Vertebral Sel Vertebral Bilat Mod Sed  03/10/2016  INDICATION: History of new onset seizures. Abnormal CT angiogram of the neck revealing prominence of vessels supplying the tongue on the left side. EXAM: IR ANGIO INTRA EXTRACRAN SEL COM CAROTID INNOMINATE UNI RIGHT MOD SED BILATERAL COMMON CAROTID ARTERY, LEFT EXTERNAL CAROTID ARTERY, AND BILATERAL  VERTEBRAL ARTERY ANGIOGRAMS: COMPARISON:  CT angiogram of the head and neck of 03/01/2016. MEDICATIONS: None. ANESTHESIA/SEDATION: Versed 2 mg IV; Fentanyl 50 mcg IV. Moderate Sedation Time:  40 minutes. The patient was continuously monitored during the procedure by the interventional radiology nurse under my direct supervision. CONTRAST:  Omnipaque 300 approximately 60 mL FLUOROSCOPY TIME:  Fluoroscopy Time: 20 minutes 42 seconds (1339 mGy). COMPLICATIONS: None immediate. TECHNIQUE: Informed written consent was obtained from the patient after a thorough discussion of the procedural risks, benefits and alternatives. All questions were addressed. Maximal Sterile Barrier Technique was utilized including caps, mask, sterile gowns, sterile gloves, sterile drape, hand hygiene and skin antiseptic. A timeout was performed prior to the initiation of the procedure. The right groin was prepped and draped in the usual sterile fashion. Thereafter using modified Seldinger technique, transfemoral access into the right common femoral artery was obtained without difficulty. Over a 0.035 inch guidewire a 5 French Pinnacle sheath was inserted. Through this, and also over a 0.035 inch guidewire a 5 [place] JB 1 catheter was advanced to the aortic arch region and selectively positioned in the right vertebral artery, right common carotid artery, the left common carotid artery, the left external carotid artery and the left vertebral artery. The patient tolerated the procedure well. There were no acute complications. FINDINGS: The right common carotid arteriogram demonstrates the right external carotid artery and its major branches to be widely patent. The right internal carotid artery at the bulb demonstrates smooth shallow plaque along the posterior wall. No acute ulcerations are seen. The vessel is  otherwise seen to opacify to the cranial skull base due to minimal tortuosity in its mid cervical region. The cavernous and the supraclinoid  segments are widely patent. The right middle cerebral artery and the right anterior cerebral artery opacify into the capillary and the venous phases. The delayed capillary phase and the early venous phase demonstrates a wedge-shaped area of numerous venules which then converge to a large prominent smooth vein which drains into the junction of the anterior and the middle one thirds of the superior sagittal sinus. This is most consistent with a developmental venous anomaly. The right vertebral artery origin is normal. The vessel is seen to opacify normally to the cranial skull base. Normal opacification is seen of the right vertebrobasilar junction and the right posterior inferior cerebellar artery. The basilar artery, the posterior cerebral artery, superior cerebellar arteries and the anterior-inferior cerebellar arteries opacify normally into the capillary and the venous phases. Non-opacified blood is seen in the basilar artery from the contralateral vertebral artery. 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 The left common carotid arteriogram demonstrates the left external carotid artery and its major branches to be widely patent. The left internal carotid artery at the bulb to the cranial skull base opacifies normally. The petrous, cavernous and the supraclinoid segments are widely patent. Left posterior communicating artery is seen opacifying the left posterior cerebral artery distribution. The left middle cerebral artery and the left anterior cerebral artery opacify normally into the capillary and the venous phases. A selective left external carotid arteriogram demonstrates asymmetric prominence of the left lingual artery, the left facial artery, and also transverse facial branches from the internal maxillary artery. There is no evidence of early venous opacification of any of these branches mentioned. Contrast blush is noted in the region of the superior meatus of the nasal cavity  which may represent inflammatory reaction. The left vertebral artery origin is normal. The vessel is seen to opacify to the cranial skull base. Opacification of the left posterior inferior cerebellar artery and the left vertebrobasilar junction is noted. The basilar artery, the posterior cerebral arteries superior cerebellar arteries and the anterior-inferior cerebellar arteries opacify into the capillary and the venous phases. IMPRESSION: Asymmetric prominence of the left lingual artery, left facial artery, and left internal maxillary artery transverse facial branches. However no evidence of arteriovenous shunting is noted in any of the above distributions. Moderate-sized developmental venous anomaly of the right mid frontal subcortical area as described above. Electronically Signed   By: Luanne Bras M.D.   On: 03/04/2016 12:20   Ir Angio External Carotid Sel Ext Carotid Uni L Mod Sed  03/10/2016  INDICATION: History of new onset seizures. Abnormal CT angiogram of the neck revealing prominence of vessels supplying the tongue on the left side. EXAM: IR ANGIO INTRA EXTRACRAN SEL COM CAROTID INNOMINATE UNI RIGHT MOD SED BILATERAL COMMON CAROTID ARTERY, LEFT EXTERNAL CAROTID ARTERY, AND BILATERAL VERTEBRAL ARTERY ANGIOGRAMS: COMPARISON:  CT angiogram of the head and neck of 03/01/2016. MEDICATIONS: None. ANESTHESIA/SEDATION: Versed 2 mg IV; Fentanyl 50 mcg IV. Moderate Sedation Time:  40 minutes. The patient was continuously monitored during the procedure by the interventional radiology nurse under my direct supervision. CONTRAST:  Omnipaque 300 approximately 60 mL FLUOROSCOPY TIME:  Fluoroscopy Time: 20 minutes 42 seconds (1339 mGy). COMPLICATIONS: None immediate. TECHNIQUE: Informed written consent was obtained from the patient after a thorough discussion of the procedural risks, benefits and alternatives. All questions were addressed. Maximal Sterile Barrier Technique was utilized  including caps, mask,  sterile gowns, sterile gloves, sterile drape, hand hygiene and skin antiseptic. A timeout was performed prior to the initiation of the procedure. The right groin was prepped and draped in the usual sterile fashion. Thereafter using modified Seldinger technique, transfemoral access into the right common femoral artery was obtained without difficulty. Over a 0.035 inch guidewire a 5 French Pinnacle sheath was inserted. Through this, and also over a 0.035 inch guidewire a 5 [place] JB 1 catheter was advanced to the aortic arch region and selectively positioned in the right vertebral artery, right common carotid artery, the left common carotid artery, the left external carotid artery and the left vertebral artery. The patient tolerated the procedure well. There were no acute complications. FINDINGS: The right common carotid arteriogram demonstrates the right external carotid artery and its major branches to be widely patent. The right internal carotid artery at the bulb demonstrates smooth shallow plaque along the posterior wall. No acute ulcerations are seen. The vessel is otherwise seen to opacify to the cranial skull base due to minimal tortuosity in its mid cervical region. The cavernous and the supraclinoid segments are widely patent. The right middle cerebral artery and the right anterior cerebral artery opacify into the capillary and the venous phases. The delayed capillary phase and the early venous phase demonstrates a wedge-shaped area of numerous venules which then converge to a large prominent smooth vein which drains into the junction of the anterior and the middle one thirds of the superior sagittal sinus. This is most consistent with a developmental venous anomaly. The right vertebral artery origin is normal. The vessel is seen to opacify normally to the cranial skull base. Normal opacification is seen of the right vertebrobasilar junction and the right posterior inferior cerebellar artery. The basilar  artery, the posterior cerebral artery, superior cerebellar arteries and the anterior-inferior cerebellar arteries opacify normally into the capillary and the venous phases. Non-opacified blood is seen in the basilar artery from the contralateral vertebral artery. 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 The left common carotid arteriogram demonstrates the left external carotid artery and its major branches to be widely patent. The left internal carotid artery at the bulb to the cranial skull base opacifies normally. The petrous, cavernous and the supraclinoid segments are widely patent. Left posterior communicating artery is seen opacifying the left posterior cerebral artery distribution. The left middle cerebral artery and the left anterior cerebral artery opacify normally into the capillary and the venous phases. A selective left external carotid arteriogram demonstrates asymmetric prominence of the left lingual artery, the left facial artery, and also transverse facial branches from the internal maxillary artery. There is no evidence of early venous opacification of any of these branches mentioned. Contrast blush is noted in the region of the superior meatus of the nasal cavity which may represent inflammatory reaction. The left vertebral artery origin is normal. The vessel is seen to opacify to the cranial skull base. Opacification of the left posterior inferior cerebellar artery and the left vertebrobasilar junction is noted. The basilar artery, the posterior cerebral arteries superior cerebellar arteries and the anterior-inferior cerebellar arteries opacify into the capillary and the venous phases. IMPRESSION: Asymmetric prominence of the left lingual artery, left facial artery, and left internal maxillary artery transverse facial branches. However no evidence of arteriovenous shunting is noted in any of the above distributions. Moderate-sized developmental venous anomaly of the  right mid frontal subcortical area as described above. Electronically Signed  By: Luanne Bras M.D.   On: 03/04/2016 12:20    DISCHARGE EXAMINATION: Filed Vitals:   03/31/16 1003 03/31/16 1255 03/31/16 2030 04/01/16 0456  BP: 148/89 134/83 120/66 116/78  Pulse:  69 65 58  Temp:  98 F (36.7 C) 98.1 F (36.7 C) 98.2 F (36.8 C)  TempSrc:  Oral Oral Oral  Resp:  17 18 18   Height:      Weight:      SpO2:  98% 97% 96%   General appearance: alert, cooperative, appears stated age and no distress Resp: clear to auscultation bilaterally Cardio: regular rate and rhythm, S1, S2 normal, no murmur, click, rub or gallop GI: soft, non-tender; bowel sounds normal; no masses,  no organomegaly Extremities: extremities normal, atraumatic, no cyanosis or edema  DISPOSITION: Home with wife  Discharge Instructions    Call MD for:  difficulty breathing, headache or visual disturbances    Complete by:  As directed      Call MD for:  extreme fatigue    Complete by:  As directed      Call MD for:  persistant dizziness or light-headedness    Complete by:  As directed      Call MD for:  persistant nausea and vomiting    Complete by:  As directed      Call MD for:  severe uncontrolled pain    Complete by:  As directed      Call MD for:  temperature >100.4    Complete by:  As directed      Diet - low sodium heart healthy    Complete by:  As directed      Discharge instructions    Complete by:  As directed   Please keep your appointment for further testing. Dr. Irven Shelling office will call you to set up the holter monitoring. Please call his office if you don't hear anything in the next 2 days. You should also be hearing about the sleep study and PFT's.  You were cared for by a hospitalist during your hospital stay. If you have any questions about your discharge medications or the care you received while you were in the hospital after you are discharged, you can call the unit and asked to speak with  the hospitalist on call if the hospitalist that took care of you is not available. Once you are discharged, your primary care physician will handle any further medical issues. Please note that NO REFILLS for any discharge medications will be authorized once you are discharged, as it is imperative that you return to your primary care physician (or establish a relationship with a primary care physician if you do not have one) for your aftercare needs so that they can reassess your need for medications and monitor your lab values. If you do not have a primary care physician, you can call [phone] for a physician referral.     Increase activity slowly    Complete by:  As directed      Polysomnography 4 or more parameters    Complete by:  As directed   Where should this test be performed:  [place]:  Allergies  Allergen Reactions  . Morphine And Related Nausea And Vomiting     Discharge Medication List as of 04/01/2016 10:46 AM    START taking these medications   Details  gabapentin (NEURONTIN) 300 MG capsule Take 1 capsule (300 mg  total) by mouth 3 (three) times daily. Start on 04/04/16, Starting 04/04/2016, Until Discontinued, Print      CONTINUE these medications which have NOT CHANGED   Details  atorvastatin (LIPITOR) 40 MG tablet Take 20 mg by mouth daily. , Starting 08/18/2012, Until Discontinued, Historical Med    diazepam (DIASAT) 20 MG GEL Place 20 mg rectally once. For breathing trouble or seizure lasting >5 min., Starting 03/22/2016, Print    divalproex (DEPAKOTE) 250 MG DR tablet Take 2 tablets (500 mg total) by mouth 2 (two) times daily., Starting 03/23/2016, Until Discontinued, Print    DULoxetine (CYMBALTA) 30 MG capsule Take 30 mg by mouth 2 (two) times daily., Starting 02/22/2016, Until Discontinued, Historical Med    Misc Natural Products (OSTEO BI-FLEX JOINT SHIELD) TABS Take 2 tablets by mouth daily., Until Discontinued, Historical Med      Multiple Vitamin (MULTIVITAMIN) tablet Take 1 tablet by mouth daily., Until Discontinued, Historical Med    nebivolol (BYSTOLIC) 10 MG tablet Take 10 mg by mouth daily., Until Discontinued, Historical Med      STOP taking these medications     levETIRAcetam (KEPPRA) 750 MG tablet        Follow-up Information    Follow up with [place].   Why:  referral for sleep study sent - they will call you regarding arranging study   Contact information:   [address] [place] [phone]      Call Jani Gravel, MD.   Specialty:  Internal Medicine   Why:  As needed   Contact information:   [place]  [address] [phone]       Follow up with Adrian Prows, MD.   Specialty:  Cardiology   Why:  his office will call you to setup the holter monitor   Contact information:   [place] [address] [phone]       TOTAL DISCHARGE TIME: 52 minutes  [facility] Pager [phone]  04/01/2016, 2:39 PM

## 2016-04-01 NOTE — Telephone Encounter (Signed)
Pt called to request a note for his wife to get out of [place] duty. Pt states that since he is not allowed to drive and she is his primary care giver, it is not realistic for her to sit for [place] duty. Please advise.

## 2016-04-01 NOTE — Progress Notes (Signed)
Subjective: No events again overnight  Exam: Filed Vitals:   03/31/16 2030 04/01/16 0456  BP: 120/66 116/78  Pulse: 65 58  Temp: 98.1 F (36.7 C) 98.2 F (36.8 C)  Resp: 18 18   Gen: In bed, NAD Resp: non-labored breathing, no acute distress Abd: soft, nt  Neuro: MS: Awake, alert CN: Face symmetric, EOMI Motor: Moves all extremities well Sensory: Intact to light touch  Impression: 55 year old male with recurrent episodes of altered mental status in the setting of hypoxia. It is difficult to tell which is the driving factor, some cardiopulmonary problem causing intermittent hypoxia with subsequent encephalopathy due to hypoxia versus partial complex seizures causing altered mental status and slowed respirations  leading to hypoxia.  He had no benefit with 2 antiepileptics and did have side effects which I think are mostly attributable to the [place]. We are currently holding these due to trying to capture an episode.  He has been referred to [facility] for formal EMU study, but due to recurrent episodes has been admitted. In this setting, we are trying to capture an episode for further definitive identification to guide our therapy.  I have advised continued monitoring, but given that there is no guarantee that we would capture a spell I do not think that it is unreasonable to discahrge with plan to follow up with [facility] for formal EMU or ambulatory EEG.    Recommendations: 1) continuous pulse oximetry 2) continuous EEG 3) workup of cardiopulmonary causes of intermittent hypoxia per internal medicine.  4) will not restart keppra given side effects. If discharged with no definitive diagnosis would use depakote at previous home dose and gabapentin 300mg  TID.  5) Could hold off on starting gabapentin if ambulatory study is being arranged.   Roland Rack, MD [facility] [phone]  If 7pm- 7am, please page neurology on call as listed in [place].

## 2016-04-01 NOTE — Telephone Encounter (Signed)
Spoke with pt and wife. They are at [facility] currently. [facility] has ran an 48 hour EEG on pt, would now like to run a 72 hour EEG. Would like to know if [facility] is still needed with the extended test being ran. Family also states that neurologist at [facility] said that he would suggest an ambulatory EEG, because maybe he'd be more likely to have an attack if he was in his normal environment. Please advise.

## 2016-04-01 NOTE — Telephone Encounter (Signed)
Since wife is on her way to pick up letter, did get verbal from provider to write letter.

## 2016-04-01 NOTE — Progress Notes (Signed)
LTM was taken down; no skin break down noted.

## 2016-04-01 NOTE — Telephone Encounter (Signed)
Order placed. Left message on Administrative Assistant Tara's vm  at [phone] to schedule exam.

## 2016-04-01 NOTE — Telephone Encounter (Signed)
I received two calls from pt's wife yesterday. The first requesting [facility] contact number, the second was to cancel that request as wife had found info on her AVS. Will call back after 9 a.m. To see if this message is a new request or an old.    [facility] [phone] and a fax of [phone]

## 2016-04-01 NOTE — Telephone Encounter (Signed)
Also, let her know that the other bloodwork (such as Lyme) was normal.  The only thing would be to start vitamin D3 4000 IU daily for low vitamin D and recheck in 6 months prior to follow up

## 2016-04-01 NOTE — Telephone Encounter (Signed)
It depends on how long it takes to see [facility]. If it will take 2 months anyway, I would keep it for now and in between, we can perform these other tests.  If the current 72 hour EEG doesn't show anything, then we can schedule for a 48 hour ambulatory EEG to try and capture a spell.  If we get the information we were looking for, then we can just cancel the EMU visit.

## 2016-04-01 NOTE — Telephone Encounter (Signed)
Nathan Ballard Mar 14, 2061. His wife called wanting to speak with you. Her number is [phone]. She said he was admitted to the ER Saturday night. Thank you

## 2016-04-01 NOTE — Procedures (Signed)
Electroencephalogram report- LTM  Ordering Physician : Dr. Silverio Decamp EEG number: [phone]    Beginning date or time: 03/31/2016 10:30AM Ending date or time: 04/01/2016 10:30AM  Day of study: day 1  Medications include: Per EMR  HISTORY: This 24 hours of intensive EEG monitoring with simultaneous video monitoring was performed for this patient with seizure like events. This EEG was requested for characterization to guide medical management.  TECHNICAL DESCRIPTION:  The study consists of a continuous 16-channel multi-montage digital video EEG recording with twenty-one electrodes placed according to the International 10-20 System. Additional leads included eye leads, true temporal leads (T1, T2), and an EKG lead.   REPORT: The background activity in this tracing consists of about 9Hz  posterior dominant rhythm. The activity is symmetric bilaterally, reactive to eye opening and stimulation. Drowsiness and sleep were manifested by background fragmentation, vertex waves and sleep spindles. No focal slowing or epileptiform activity was seen. No electrographic seizures were seen.  There were no push button events during the monitoring.   IMPRESSION: This EEG shows: No focal or paroxysmal activity  CLINICAL CORRELATION: The nature of patient's events remains unclear as none were captured during the monitoring.

## 2016-04-01 NOTE — Telephone Encounter (Signed)
Spoke to family. They do have [facility] at [facility].

## 2016-04-01 NOTE — Telephone Encounter (Signed)
[facility] EMU appointment has not yet been scheduled. They have spoken w/ them, but [facility] asked them to double check with [place] if appointment would still be needed since he had been at hospital w/ them running test. Requested that they call [facility] EMU back and see how far out appointment are running. Told them to schedule appointment if it would take more than 1 month to get in, and we could cancel if we were able to gather info needed on testing between now and then. If less than 1 month to get appointment at [facility] pt told to hold of on scheduling until after ambulatory EEG was performed.   Should ambulatory EEG be a 48 or 72 hour test? Please advise.

## 2016-04-02 NOTE — Telephone Encounter (Signed)
Pt aware. Will pick up vitamins and start taking.

## 2016-04-04 ENCOUNTER — Telehealth: Payer: Self-pay | Admitting: Neurology

## 2016-04-04 NOTE — Telephone Encounter (Signed)
Called and left Diane a voicemail to let me know what results they are wanting.

## 2016-04-04 NOTE — Telephone Encounter (Signed)
Sent mychart message to clarify which results they were wanting. Will call later in the morning.

## 2016-04-04 NOTE — Telephone Encounter (Signed)
VM-PT's wife Beverlee Nims left a message wanting to know about test results/Dawn CB# [phone]

## 2016-04-08 ENCOUNTER — Telehealth: Payer: Self-pay | Admitting: Emergency Medicine

## 2016-04-08 ENCOUNTER — Ambulatory Visit (HOSPITAL_COMMUNITY)
Admission: RE | Admit: 2016-04-08 | Discharge: 2016-04-08 | Disposition: A | Discharge status: Home / Self Care | Payer: Managed Care, Other (non HMO) | Source: Ambulatory Visit | Attending: Neurology | Admitting: Neurology

## 2016-04-08 DIAGNOSIS — R6889 Other general symptoms and signs: Secondary | ICD-10-CM | POA: Diagnosis not present

## 2016-04-08 DIAGNOSIS — Z79899 Other long term (current) drug therapy: Secondary | ICD-10-CM | POA: Insufficient documentation

## 2016-04-08 DIAGNOSIS — IMO0001 Reserved for inherently not codable concepts without codable children: Secondary | ICD-10-CM

## 2016-04-08 DIAGNOSIS — J9601 Acute respiratory failure with hypoxia: Secondary | ICD-10-CM

## 2016-04-08 NOTE — Telephone Encounter (Signed)
This message was given to me by CY. He states that RB needs to address this message.  Per RB >> pt can make a sooner appointment with NP.  If he continues to have seizure like activity he needs to be evaluated in the ED,  he also needs to be evaluated in the ED if his oxygen levels are dropping to 19%.  Coming here for an appointment with these symptoms will just result in [place] sending him to the hospital anyway.

## 2016-04-08 NOTE — Telephone Encounter (Signed)
Yes, this can be entered in my name

## 2016-04-08 NOTE — Telephone Encounter (Signed)
Order has been entered for PFT.  [facility] to schedule ASAP.

## 2016-04-08 NOTE — Telephone Encounter (Signed)
Called, spoke with pt's wife.  Discussed below.  Wife verbalized understanding and states "I understand when he needs to go to the ED."  States pt is doing well at this time and has been doing fine since his last trip to the ED on Saturday 5/20 with seizure like activity.  Reports sats where not 19% at home -- this episode happened while pt was in the hospital.  Wife reports during last hospital visit, they were advised neuro is not sure if pt is having seizures, and they are trying to figure out why pt's o2 sats are dropping and if this correlates with the seizure like activity pt is having.  Reports they were advised the next workup would be a PFT with pulmonary.  Wondering if PFT can be done prior to or at next OV. PFT was ordered by Dr.  [place] Pink while pt was in the hospital but has not yet been scheduled.  RB, please advise if we can order this under your name and proceed with scheduling.  Thank you.

## 2016-04-08 NOTE — Telephone Encounter (Signed)
Patients wife states that patient was seen by Dr. Annamaria Boots at hospital during his stay.  Patients wife states that patient has been suffering from seizure-like activity and his oxygen levels dropped as low as 19% (Nineteen percent).  Patients wife states that there have been several incidents when patient will be struggling to breath and becomes unresponsive.  She is very concerned.  Patient has appointment with Dr. Lamonte Sakai on 6/27 but would like to be seen sooner.  Patient's wife wants to know if Dr. Annamaria Boots would like to see patient himself since he saw patient while he was in the hospital.    Dr. Annamaria Boots, please advise.

## 2016-04-08 NOTE — Progress Notes (Signed)
72 hr ambulatory EEG hooked up, no skin breakdown noted. Told patient to be back tomorrow 04/09/16 at noon for battery change. Patient given instructions.

## 2016-04-09 NOTE — Telephone Encounter (Signed)
[email or web address]  is going to call pt to set up this pft Nathan Ballard

## 2016-04-11 ENCOUNTER — Ambulatory Visit (HOSPITAL_COMMUNITY)
Admission: RE | Admit: 2016-04-11 | Discharge: 2016-04-11 | Disposition: A | Discharge status: Home / Self Care | Payer: Managed Care, Other (non HMO) | Source: Ambulatory Visit | Attending: Emergency Medicine | Admitting: Emergency Medicine

## 2016-04-11 DIAGNOSIS — J9601 Acute respiratory failure with hypoxia: Secondary | ICD-10-CM | POA: Insufficient documentation

## 2016-04-11 LAB — PULMONARY FUNCTION TEST
DL/VA % PRED: 110 %
DL/VA: 5.24 ml/min/mmHg/L
DLCO COR % PRED: 108 %
DLCO COR: 38.15 ml/min/mmHg
DLCO UNC % PRED: 104 %
DLCO unc: 36.68 ml/min/mmHg
FEF 25-75 POST: 4.9 L/s
FEF 25-75 PRE: 4.48 L/s
FEF2575-%CHANGE-POST: 9 %
FEF2575-%PRED-POST: 142 %
FEF2575-%PRED-PRE: 130 %
FEV1-%CHANGE-POST: 2 %
FEV1-%Pred-Post: 112 %
FEV1-%Pred-Pre: 109 %
FEV1-Post: 4.55 L
FEV1-Pre: 4.43 L
FEV1FVC-%CHANGE-POST: 2 %
FEV1FVC-%PRED-PRE: 106 %
FEV6-%Change-Post: -1 %
FEV6-%PRED-POST: 105 %
FEV6-%Pred-Pre: 106 %
FEV6-POST: 5.35 L
FEV6-Pre: 5.41 L
FEV6FVC-%Change-Post: -1 %
FEV6FVC-%Pred-Post: 102 %
FEV6FVC-%Pred-Pre: 103 %
FVC-%Change-Post: 0 %
FVC-%PRED-POST: 103 %
FVC-%PRED-PRE: 102 %
FVC-POST: 5.44 L
FVC-PRE: 5.42 L
POST FEV1/FVC RATIO: 84 %
POST FEV6/FVC RATIO: 98 %
PRE FEV1/FVC RATIO: 82 %
Pre FEV6/FVC Ratio: 100 %
RV % pred: 60 %
RV: 1.35 L
TLC % pred: 94 %
TLC: 6.94 L

## 2016-04-11 MED ORDER — ALBUTEROL SULFATE (2.5 MG/3ML) 0.083% IN NEBU
2.5000 mg | INHALATION_SOLUTION | Freq: Once | RESPIRATORY_TRACT | Status: AC
Start: 2016-04-11 — End: 2016-04-11
  Administered 2016-04-11: 2.5 mg via RESPIRATORY_TRACT

## 2016-04-11 NOTE — Progress Notes (Signed)
72 hr AMB EEG discontinued. T5, F7, & F3 skin breakdown noted.

## 2016-04-15 ENCOUNTER — Other Ambulatory Visit (HOSPITAL_COMMUNITY): Payer: Managed Care, Other (non HMO)

## 2016-04-15 ENCOUNTER — Telehealth: Payer: Self-pay

## 2016-04-15 ENCOUNTER — Telehealth: Payer: Self-pay | Admitting: Emergency Medicine

## 2016-04-15 DIAGNOSIS — Q280 Arteriovenous malformation of precerebral vessels: Secondary | ICD-10-CM | POA: Insufficient documentation

## 2016-04-15 DIAGNOSIS — R9089 Other abnormal findings on diagnostic imaging of central nervous system: Secondary | ICD-10-CM | POA: Insufficient documentation

## 2016-04-15 NOTE — Telephone Encounter (Signed)
[phone], pt wife cb

## 2016-04-15 NOTE — Telephone Encounter (Signed)
lmtcb x1 for pt's wife. 

## 2016-04-15 NOTE — Telephone Encounter (Signed)
Pt wife returning call and can be reached @ [phone].Hillery Hunter

## 2016-04-15 NOTE — Procedures (Signed)
[facility] REPORT  Dates of Study: 04/09/2016 to 04/11/2016  Patient's Name: Nathan Ballard MRN: [id number] Date of Birth: May 08, 1961  Indication: 55 year old man with recurrent episodes of syncope and hypoxia  Medications: Keppra Depakote Cymbalta Bystolic Lipitor  Technical Summary: This is a multichannel digital EEG recording, using the international 10-20 placement system with electrodes applied with paste and impedances below 5000 ohms.    Description: The EEG background is symmetric, with a well-developed posterior dominant rhythm of 10 Hz, which is reactive to eye opening and closing.  Diffuse beta activity is seen, with a bilateral frontal preponderance.  No focal or generalized abnormalities are seen.  No focal or generalized epileptiform discharges are seen.  Stage II sleep is seen, with normal and symmetric sleep patterns.  ECG revealed normal cardiac rate and rhythm.  Impression: This is a normal 72 hour ambulatory EEG.  None of the patient's habitual spells were captured.  Therefore, possibility of epilepsy cannot be ruled out.  Adam R. Tomi Likens, DO

## 2016-04-15 NOTE — Telephone Encounter (Signed)
lmtcb x1 for pt. 

## 2016-04-15 NOTE — Telephone Encounter (Signed)
Pt aware. Will have wife call [facility] to schedule

## 2016-04-15 NOTE — Telephone Encounter (Signed)
Spoke with wife. Pt had 72 hours EEG done. Finished about 3 p.m. On Friday, about 6 p.m. Friday night pt took a long shower when he got out O2 level was 75. Pt was taken to [facility], EMS came, pt was not transported to hospital. O2 levels did recover, has experienced 2 other small episodes since then. Wife wanted to make you aware. They did also drop off his log from while he had EEG recorder on.

## 2016-04-15 NOTE — Telephone Encounter (Addendum)
Spoke with pt's wife. She would like to know if pt can be seen sooner than his scheduled appointment. Advised that we do not have any available consult appointments prior to that. Pt is having issues with oxygen desaturation. Advised pt's wife until he comes in for an appointment to address his PCP about his oxygen needs. She verbalized understanding.

## 2016-04-15 NOTE — Telephone Encounter (Signed)
72 hour EEG is normal.  I would like to proceed with referral to the EMU at [facility].

## 2016-04-22 ENCOUNTER — Institutional Professional Consult (permissible substitution): Payer: Managed Care, Other (non HMO) | Admitting: Pulmonary Disease

## 2016-05-06 ENCOUNTER — Ambulatory Visit (INDEPENDENT_AMBULATORY_CARE_PROVIDER_SITE_OTHER): Payer: Managed Care, Other (non HMO) | Admitting: Emergency Medicine

## 2016-05-06 ENCOUNTER — Encounter: Payer: Self-pay | Admitting: Emergency Medicine

## 2016-05-06 VITALS — BP 120/74 | HR 60 | Ht 72.0 in | Wt 204.0 lb

## 2016-05-06 DIAGNOSIS — J9601 Acute respiratory failure with hypoxia: Secondary | ICD-10-CM | POA: Diagnosis not present

## 2016-05-06 NOTE — Assessment & Plan Note (Signed)
Patient has had episodic periods of hypoxemia always in the setting of acute neurological event. Not clear what the events are but they sound like simple partial seizures. On at least one occasion it happened at night and resulted in severe hypoxemia, suppressed respiratory drive requiring intubation and brief ventilatory support. This occurred while he was hospitalized. His pupillary function testing is normal. He is not hypoxic in absence of these events. I don't believe he has intrinsic lung disease based on the evaluation today. I think the hypoxemia is directly related to the neurological events and possibly a postictal state.  He will get his polysomnogram as planned so that we can rule out obstructive sleep apnea as a contributor. He will continue to follow with neurology.

## 2016-05-06 NOTE — Progress Notes (Signed)
Subjective:    Patient ID: Nathan Ballard, male    DOB: [birth year], 55 y.o.   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 He was on depakote and neurontin, and then these were stopped in May in prep for subsequent EEG's - he has undergone 24h x 2, 72h x 1. Stopped cymbalta as well. Has not had any further episodes since.   He had a remote polysomnogram that did not show obstructive sleep apnea, an unattended home study.    He underwent pulmonary function testing on 04/11/16 that I have personally reviewed. This shows normal airflows without a bronchodilator response, normal total lung capacity with a decreased residual volume suggestive of possible restriction, and a normal diffusion capacity. He has had further events - altered MS and then associated low O2. No CP, no wheeze. He does have a chronic cough, dry, non-productive.    Review of Systems  Constitutional: Negative for fever and unexpected weight change.  HENT: Negative for congestion, dental problem, ear pain, nosebleeds, postnasal drip, rhinorrhea, sinus pressure, sneezing, sore throat and trouble swallowing.   Eyes: Negative for redness and itching.  Respiratory: Positive for cough. Negative for chest tightness, shortness of breath and wheezing.   Cardiovascular: Negative for palpitations and leg swelling.  Gastrointestinal: Negative for nausea and vomiting.  Genitourinary: Negative for dysuria.  Musculoskeletal: Negative for joint swelling.  Skin: Negative for rash.  Neurological: Negative for headaches.  Hematological: Does not bruise/bleed easily.  Psychiatric/Behavioral: Negative for dysphoric mood. The patient is not nervous/anxious.     Past Medical History    Diagnosis Date  . Hypertension   . Hyperlipidemia   . Anxiety   . Arthritis   . Seizures ()      Family History  Problem Relation Age of Onset  . Colon cancer Neg Hx   . Stomach cancer Neg Hx   . Diabetes Father   . Heart disease Father   . Hypertension Father   . Alzheimer's disease Mother   . Alzheimer's disease Maternal Grandmother      Social History   Social History  . Marital Status: Married    Spouse Name: N/A  . Number of Children: N/A  . Years of Education: N/A   Occupational History  . Not on file.   Social History Main Topics  . Smoking status: Never Smoker   . Smokeless tobacco: Never Used  . Alcohol Use: No  . Drug Use: No  . Sexual Activity: Not on file   Other Topics Concern  . 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 Morphine And Related Nausea And Vomiting     Outpatient Prescriptions Prior to Visit  Medication Sig Dispense Refill  . atorvastatin (LIPITOR) 40 MG tablet Take 20 mg by mouth daily.     . Misc Natural Products (OSTEO BI-FLEX JOINT SHIELD) TABS Take 2 tablets by mouth daily.    . Multiple Vitamin (MULTIVITAMIN) tablet Take 1 tablet by mouth daily.    . nebivolol (BYSTOLIC) 10 MG tablet Take 10 mg by mouth daily.    . DULoxetine (CYMBALTA) 30 MG capsule Take 30 mg by mouth 2 (two) times daily.    Marland Kitchen  diazepam (DIASAT) 20 MG GEL Place 20 mg rectally once. For breathing trouble or seizure lasting >5 min. 1 Package 0  . divalproex (DEPAKOTE) 250 MG DR tablet Take 2 tablets (500 mg total) by mouth 2 (two) times daily. 180 tablet 0  . gabapentin (NEURONTIN) 300 MG capsule Take 1 capsule (300 mg total) by mouth 3 (three) times daily. Start on 04/04/16 90 capsule 0   No facility-administered medications prior to visit.         Objective:   Physical Exam  Filed Vitals:   05/06/16 1515  BP: 120/74  Pulse: 60  Height: 6' (1.829 m)  Weight: 204 lb  (92.534 kg)  SpO2: 97%  Gen: Pleasant, well-nourished, in no distress,  normal affect  ENT: No lesions,  mouth clear,  oropharynx clear, no postnasal drip  Neck: No JVD, no TMG, no carotid bruits  Lungs: No use of accessory muscles, clear without rales or rhonchi  Cardiovascular: RRR, heart sounds normal, no murmur or gallops, no peripheral edema  Musculoskeletal: No deformities, no cyanosis or clubbing  Neuro: alert, non focal  Skin: Warm, no lesions or rashes     Assessment & Plan:  Acute respiratory failure with hypoxia (HCC) Patient has had episodic periods of hypoxemia always in the setting of acute neurological event. Not clear what the events are but they sound like simple partial seizures. On at least one occasion it happened at night and resulted in severe hypoxemia, suppressed respiratory drive requiring intubation and brief ventilatory support. This occurred while he was hospitalized. His pupillary function testing is normal. He is not hypoxic in absence of these events. I don't believe he has intrinsic lung disease based on the evaluation today. I think the hypoxemia is directly related to the neurological events and possibly a postictal state.  He will get his polysomnogram as planned so that we can rule out obstructive sleep apnea as a contributor. He will continue to follow with neurology.    Baltazar Apo, MD, PhD 05/06/2016, 3:53 PM [facility] [phone] or if no answer [phone]

## 2016-05-06 NOTE — Patient Instructions (Signed)
Please get your sleep study as planned.  Please continue to follow with Neurology Your pulmonary function testing is normal Follow with Dr Lamonte Sakai in 2 months or sooner if you have any problems.

## 2016-05-14 ENCOUNTER — Ambulatory Visit: Payer: Managed Care, Other (non HMO) | Admitting: Neurology

## 2016-05-28 ENCOUNTER — Encounter (HOSPITAL_BASED_OUTPATIENT_CLINIC_OR_DEPARTMENT_OTHER): Payer: Managed Care, Other (non HMO)

## 2016-06-13 ENCOUNTER — Ambulatory Visit: Payer: Managed Care, Other (non HMO) | Admitting: Neurology

## 2016-06-16 ENCOUNTER — Telehealth: Payer: Self-pay

## 2016-06-16 DIAGNOSIS — G40209 Localization-related (focal) (partial) symptomatic epilepsy and epileptic syndromes with complex partial seizures, not intractable, without status epilepticus: Secondary | ICD-10-CM

## 2016-06-16 NOTE — Telephone Encounter (Signed)
-----   Message from Amada Kingfisher, [place] sent at 04/02/2016  7:48 AM EDT ----- Vitamin D check

## 2016-06-16 NOTE — Telephone Encounter (Signed)
Pt is out of town on vacation. Will come by early next week toh ave Vitamin D drawn before Friday appointment.

## 2016-06-24 ENCOUNTER — Other Ambulatory Visit (INDEPENDENT_AMBULATORY_CARE_PROVIDER_SITE_OTHER): Payer: Managed Care, Other (non HMO)

## 2016-06-24 DIAGNOSIS — G40209 Localization-related (focal) (partial) symptomatic epilepsy and epileptic syndromes with complex partial seizures, not intractable, without status epilepticus: Secondary | ICD-10-CM | POA: Diagnosis not present

## 2016-06-24 LAB — VITAMIN D 25 HYDROXY (VIT D DEFICIENCY, FRACTURES): VITD: 36.21 ng/mL (ref 30.00–100.00)

## 2016-06-27 ENCOUNTER — Encounter: Payer: Self-pay | Admitting: Neurology

## 2016-06-27 ENCOUNTER — Ambulatory Visit (INDEPENDENT_AMBULATORY_CARE_PROVIDER_SITE_OTHER): Payer: Managed Care, Other (non HMO) | Admitting: Neurology

## 2016-06-27 VITALS — BP 126/80 | HR 65 | Ht 72.0 in | Wt 210.0 lb

## 2016-06-27 DIAGNOSIS — J9601 Acute respiratory failure with hypoxia: Secondary | ICD-10-CM | POA: Diagnosis not present

## 2016-06-27 DIAGNOSIS — R0681 Apnea, not elsewhere classified: Secondary | ICD-10-CM

## 2016-06-27 NOTE — Progress Notes (Signed)
Chart forwarded.  

## 2016-06-27 NOTE — Progress Notes (Signed)
NEUROLOGY FOLLOW UP OFFICE NOTE  [name] [id number]  HISTORY OF PRESENT ILLNESS: Nathan Ballard is a 55 year old man with hypertension and hyperlipidemia who follows up for spells associated with acute respiratory failure.  He is accompanied by his wife who provides some history as well.  UPDATE: Labs from 03/28/16 include VPA level of 60; CBC with WBC 4.1, HGB 13.9, HCT 41, and PLT 204; CMP with Na 137, K 4.7, glucose 93, BUN 12, Cr 0.84, TB 0.6, ALP 46, AST 18 and ALT 17.  Labs from 03/31/16 showed CBC with PLT 192 and LFTs with TB 0.5, ALP 43, AST 18 and ALT 15.  Vitamin D level from this month was 36.  He was readmitted to the hospital on 03/29/16.  Long-term EEG was unremarkable.  Keppra was discontinued and he was started on gabapentin 300mg  three times daily.  He remained on Depakote.  However, he was under the impression that he was supposed to stop his AEDs for the ambulatory EEG.  He also stopped Cymbalta as well.  72 hour EEG from 04/09/16 to 04/11/16 was normal but did not capture his habitual spells.  He had another spell on 04/14/16.  He was seen by pulmonology and cardiology, both did not suspect primary pulmonary or cardiac etiology.  Since then, he has been doing well.  He has not had any other spells.  He has not had any apneic episodes during sleep.  He feels better.  His memory is better.  He was referred to the EMU at [facility] for further evaluation, as well as a sleep study, but he cancelled both since he had been feeling well.   HISTORY: He began experiencing several spells since March of this year.  He has had recurrent episodes in which he is unable to be woken up from a deep sleep with labored breathing.  It would last 10 to 15 minutes.  After he wakes up, he is either lucid or somewhat confused.  On a couple of occasions, he was given Narcan by EMS and he awoke.  However, UDS was negative so it was thought to be coincidental.  He had several ED visits.  On 02/28/16, he  was admitted to [facility] following another spell.  He was administered Narcan and reportedly woke up, however UDS was negative in the ED.  CBC showed mildly elevated WBC of 12.8.  He was afebrile without sign of infection.  LFTs were elevated but normalized by time of discharge.  He was started on Keppra 500mg  twice daily.  CT of head was personally reviewed and was unremarkable.  MRI of brain was personally reviewed, which only revealed a right frontal developmental venous anomaly (stable compared to MRI in 2008), but no acute process.  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  He became unresponsive in the hospital and had an O2 sat of 19% and was making gurgling noises.  He was intubated for airway protection.  He was hypotensive but did not lose pulse. Echocardiogram was unremarkable and showed EF 55-60% with grade 1 diastolic dysfunction.  Routine EEG and overnight EEG were normal.  He was discharged on Keppra 1500mg  twice daily.  Keppra level was 17.7.  Acute hypoxic and hypercapnic respiratory failure was felt to be secondary to seizure.  He returned to the ED on 03/22/16 after another spell.  He  was started on Depakote DR 750mg  three times daily, however he was not given a loading dose.  He was also prescribed Diastat.  Later that evening, he was standing in the kitchen and developed a glassy look in his eyes.  He was slow to respond.  He was sitting and awake but started to shake all over, followed by loss of consciousness.  Otherwise, he does not demonstrate any convulsions.  His wife drove him to nearby fire station for vitals, where his O2 sat was 68% on room air and 86% on O2.  He returned to the ED. Blood pressure was in the 90s/60s and pulse in the 40s.  Labs, including UA, CBC and BMP, were unremarkable.  UDS only positive for benzos and barbiturates.  He was  given a loading dose of IV Depacon followed by home dose of 500mg  twice daily.   He continues to have spells in which he seems a little confused.  Last spell was last night.  He does not demonstrate tongue biting or urinary/bowel incontinence.  Blood sugar has always been okay.  Since then   He had a similar episode of confusion in 2008.  MRI of brain with and without contrast on 11/10/07 demonstrated the incidental developmental venous anomaly.  In hindsight, his wife thinks he probably has had recurrent spells of confusion over the past few years.   He denies history of meningitis or head trauma.  He had an uncomplicated birth. He has no family history of seizures.  PAST MEDICAL HISTORY: Past Medical History:  Diagnosis Date  . Anxiety   . Arthritis   . Hyperlipidemia   . Hypertension   . Seizures ([place])     MEDICATIONS: Current Outpatient Prescriptions on File Prior to Visit  Medication Sig Dispense Refill  . atorvastatin (LIPITOR) 40 MG tablet Take 20 mg by mouth daily.     . Misc Natural Products (OSTEO BI-FLEX JOINT SHIELD) TABS Take 2 tablets by mouth daily.    . Multiple Vitamin (MULTIVITAMIN) tablet Take 1 tablet by mouth daily.    . nebivolol (BYSTOLIC) 10 MG tablet Take 10 mg by mouth daily.     No current facility-administered medications on file prior to visit.     ALLERGIES: Allergies  Allergen Reactions  . Morphine And Related Nausea And Vomiting    FAMILY HISTORY: Family History  Problem Relation Age of Onset  . Colon cancer Neg Hx   . Stomach cancer Neg Hx   . Diabetes Father   . Heart disease Father   . Hypertension Father   . Alzheimer's disease Mother   . Alzheimer's disease Maternal Grandmother     SOCIAL HISTORY: Social History   Social History  . Marital status: Married    Spouse name: N/A  . Number of children: N/A  . Years of education: N/A   Occupational History  . Not on file.   Social History Main Topics  . Smoking status: Never  Smoker  . Smokeless tobacco: Never Used  . Alcohol use No  . Drug use: No  . Sexual activity: Not on file   Other Topics Concern  . Not on file   Social History Narrative  . No narrative on file    REVIEW OF SYSTEMS: Constitutional: No fevers, chills, or sweats, no generalized fatigue, change in appetite Eyes: No visual changes, double vision, eye pain Ear, nose and throat: No hearing loss, ear pain, nasal congestion, sore throat Cardiovascular: No chest pain, palpitations Respiratory:  No  shortness of breath at rest or with exertion, wheezes GastrointestinaI: No nausea, vomiting, diarrhea, abdominal pain, fecal incontinence Genitourinary:  No dysuria, urinary retention or frequency Musculoskeletal:  No neck pain, back pain Integumentary: No rash, pruritus, skin lesions Neurological: as above Psychiatric: No depression, insomnia, anxiety Endocrine: No palpitations, fatigue, diaphoresis, mood swings, change in appetite, change in weight, increased thirst Hematologic/Lymphatic:  No purpura, petechiae. Allergic/Immunologic: no itchy/runny eyes, nasal congestion, recent allergic reactions, rashes  PHYSICAL EXAM: Vitals:   06/27/16 1053  BP: 126/80  Pulse: 65   General: No acute distress.  Patient appears well-groomed.  normal body habitus. Head:  Normocephalic/atraumatic Eyes:  Fundi examined but not visualized Neck: supple, no paraspinal tenderness, full range of motion Heart:  Regular rate and rhythm Lungs:  Clear to auscultation bilaterally Back: No paraspinal tenderness Neurological Exam: alert and oriented to person, place, and time. Attention span and concentration intact, recent and remote memory intact, fund of knowledge intact.  Speech fluent and not dysarthric, language intact.  CN II-XII intact. Bulk and tone normal, muscle strength 5/5 throughout.  Sensation to light touch, temperature and vibration intact.  Deep tendon reflexes 2+ throughout, toes downgoing.  Finger  to nose and heel to shin testing intact.  Gait normal, Romberg negative.  IMPRESSION: Spells with hypoxia and hypercapnia.  Semiology still not typical for seizure.  Also, spells resolved after he had taken himself off of antiepileptic medications.  It could still be a central apneic spell.  The confusion or memory problems he had been experiencing could have been secondary to the hypoxic episodes.  Shaking spell just prior to loss of consciousness could be convulsive syncope.  Regardless, since he has not had any further episodes, he is feeling well.  PLAN: Since he has not had any recent episodes, we can hold off on sleep study or referral to EMU, as the likelihood of capturing an event is low.  He is aware of [place] law and that he must be episode-free for 6 months before he can resume driving.  He will follow up in 4 months or sooner if needed.  26 minutes spent face to face with patient, over 50% spent counseling.  Metta Clines, DO  CC:  Jani Gravel, MD

## 2016-06-27 NOTE — Patient Instructions (Signed)
I am still not certain you were having seizures.  The fact that they resolved only after you stopped the seizure medications makes me believe they were not.  I would continue to monitor.  As per [place] law, you cannot drive for 6 months after having an episode of loss of consciousness or awareness of unknown cause.  So you may resume driving on [id number] if you have no further events.  Follow up in December or sooner if you have any recurrent spells.

## 2016-07-01 ENCOUNTER — Other Ambulatory Visit (HOSPITAL_COMMUNITY): Payer: Self-pay | Admitting: Interventional Radiology

## 2016-07-01 DIAGNOSIS — Q2730 Arteriovenous malformation, site unspecified: Secondary | ICD-10-CM

## 2016-07-08 ENCOUNTER — Ambulatory Visit: Payer: Managed Care, Other (non HMO) | Admitting: Emergency Medicine

## 2016-07-24 ENCOUNTER — Encounter (HOSPITAL_COMMUNITY): Payer: Self-pay

## 2016-07-24 ENCOUNTER — Ambulatory Visit (HOSPITAL_COMMUNITY): Payer: Managed Care, Other (non HMO)

## 2016-07-24 ENCOUNTER — Ambulatory Visit (HOSPITAL_COMMUNITY): Admission: RE | Admit: 2016-07-24 | Payer: Managed Care, Other (non HMO) | Source: Ambulatory Visit

## 2016-08-22 ENCOUNTER — Ambulatory Visit: Payer: Managed Care, Other (non HMO) | Admitting: Emergency Medicine

## 2016-10-10 ENCOUNTER — Telehealth (HOSPITAL_COMMUNITY): Payer: Self-pay

## 2016-10-10 NOTE — Telephone Encounter (Signed)
Pt does not want to reschedule his ct scan right now. He wants to wait and see what his neurologist says on the 18th of this month. AW

## 2016-10-21 ENCOUNTER — Encounter (INDEPENDENT_AMBULATORY_CARE_PROVIDER_SITE_OTHER): Payer: Self-pay | Admitting: Orthopaedic Surgery

## 2016-10-21 ENCOUNTER — Ambulatory Visit (INDEPENDENT_AMBULATORY_CARE_PROVIDER_SITE_OTHER): Payer: Self-pay

## 2016-10-21 ENCOUNTER — Ambulatory Visit (INDEPENDENT_AMBULATORY_CARE_PROVIDER_SITE_OTHER): Payer: Managed Care, Other (non HMO) | Admitting: Orthopaedic Surgery

## 2016-10-21 DIAGNOSIS — M1812 Unilateral primary osteoarthritis of first carpometacarpal joint, left hand: Secondary | ICD-10-CM | POA: Diagnosis not present

## 2016-10-21 DIAGNOSIS — M25562 Pain in left knee: Secondary | ICD-10-CM

## 2016-10-21 DIAGNOSIS — G8929 Other chronic pain: Secondary | ICD-10-CM | POA: Insufficient documentation

## 2016-10-21 DIAGNOSIS — M1712 Unilateral primary osteoarthritis, left knee: Secondary | ICD-10-CM | POA: Diagnosis not present

## 2016-10-21 MED ORDER — DICLOFENAC SODIUM 1 % TD GEL: 2.0000 g | Freq: Four times a day (QID) | TRANSDERMAL | 5 refills | Status: DC

## 2016-10-21 MED ORDER — BUPIVACAINE HCL 0.5 % IJ SOLN
2.0000 mL | INTRAMUSCULAR | Status: AC | PRN
  Administered 2016-10-21: 2 mL via INTRA_ARTICULAR

## 2016-10-21 MED ORDER — LIDOCAINE HCL 1 % IJ SOLN
2.0000 mL | INTRAMUSCULAR | Status: AC | PRN
  Administered 2016-10-21: 2 mL

## 2016-10-21 MED ORDER — METHYLPREDNISOLONE ACETATE 40 MG/ML IJ SUSP
40.0000 mg | INTRAMUSCULAR | Status: AC | PRN
  Administered 2016-10-21: 40 mg via INTRA_ARTICULAR

## 2016-10-21 MED ORDER — HYDROCODONE-ACETAMINOPHEN 5-325 MG PO TABS: 1.0000 | ORAL_TABLET | Freq: Every day | ORAL | 0 refills | Status: DC | PRN

## 2016-10-21 NOTE — Progress Notes (Signed)
Office Visit Note   Patient: Nathan Ballard           Date of Birth: [birth year]           MRN: [id number] Visit Date: 10/21/2016              Requested by: Jani Gravel, MD [address], [place] [address] PCP: Jani Gravel, MD   Assessment & Plan: Visit Diagnoses:  1. Primary osteoarthritis of left knee   2. Arthritis of carpometacarpal (CMC) joint of left thumb     Plan: For the left knee we performed a cortisone injection today under sterile conditions we will get prior authorization for model disc injection. We did talk about knee replacement surgery also. In terms of the thumb thumb abduction brace was given today Voltaren gel prescribed. Could consider injection if no better.  Total face to face encounter time was greater than 25 minutes and over half of this time was spent in counseling and/or coordination of care.   Follow-Up Instructions: No Follow-up on file.   Orders:  Orders Placed This Encounter  Procedures  . XR KNEE 3 VIEW LEFT  . XR Finger Thumb Left   Meds ordered this encounter  Medications  . diclofenac sodium (VOLTAREN) 1 % GEL    Sig: Apply 2 g topically 4 (four) times daily.    Dispense:  1 Tube    Refill:  5  . HYDROcodone-acetaminophen (NORCO) 5-325 MG tablet    Sig: Take 1 tablet by mouth daily as needed.    Dispense:  10 tablet    Refill:  0      Procedures: Large Joint Inj Date/Time: 10/21/2016 9:45 AM Performed by: Leandrew Koyanagi Authorized by: Leandrew Koyanagi   Consent Given by:  Patient Timeout: prior to procedure the correct patient, procedure, and site was verified   Indications:  Pain Location:  Knee Site:  R knee Prep: patient was prepped and draped in usual sterile fashion   Needle Size:  22 G Ultrasound Guidance: No   Fluoroscopic Guidance: No   Arthrogram: No   Medications:  2 mL lidocaine 1 %; 2 mL bupivacaine 0.5 %; 40 mg methylPREDNISolone acetate 40 MG/ML Patient tolerance:  Patient tolerated the  procedure well with no immediate complications     Clinical Data: No additional findings.   Subjective: Chief Complaint  Patient presents with  . Left Thumb - Pain  . Left Knee - Pain    Patient is a 55 year old gentleman who comes in with 2 separate complaints. He is well known to me. He is status post 2 left knee anterior cruciate ligament surgeries and now has degenerative joint disease. It's causing him to fall and it is severely affecting his activity and quality of life. In terms of his left thumb hurts at the base of the thumb and thenar region it is weak with pinching and use of the hand. Denies any radiation of pain or any triggering.    Review of Systems  Constitutional: Negative.   HENT: Negative.   Eyes: Negative.   Respiratory: Negative.   Cardiovascular: Negative.   Gastrointestinal: Negative.   Endocrine: Negative.   Genitourinary: Negative.   Musculoskeletal: Negative.   Skin: Negative.   Allergic/Immunologic: Negative.   Neurological: Negative.   Hematological: Negative.   Psychiatric/Behavioral: Negative.      Objective: Vital Signs: There were no vitals taken for this visit.  Physical Exam  Constitutional: He is oriented to person,  place, and time. He appears well-developed and well-nourished.  HENT:  Head: Normocephalic and atraumatic.  Eyes: EOM are normal.  Neck: Neck supple.  Cardiovascular: Intact distal pulses.   Pulmonary/Chest: Effort normal.  Abdominal: Soft.  Neurological: He is alert and oriented to person, place, and time.  Skin: Skin is warm.  Psychiatric: He has a normal mood and affect. His behavior is normal. Judgment and thought content normal.  Nursing note and vitals reviewed.   Ortho Exam Exam of the left thumb shows a positive grind test. Negative Finkelstein's negative triggering no other focal findings. Exam of the left knee shows crepitance with range of motion. Collaterals and cruciates are stable. No joint  effusion. Specialty Comments:  No specialty comments available.  Imaging: Xr Finger Thumb Left  Result Date: 10/21/2016 Moderated DJD of thumb CMC joint  Xr Knee 3 View Left  Result Date: 10/21/2016 Advanced DJD of left knee    PMFS History: Patient Active Problem List   Diagnosis Date Noted  . Chronic pain of left knee 10/21/2016  . Abnormal finding on MRI of brain   . Arteriovenous malformation of precerebral vessels   . Syncope 03/30/2016  . 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 Hypomagnesemia 03/02/2016  . Acute respiratory failure with hypoxia ([place]) 03/02/2016  . Seizure ([place]) 02/29/2016  . Abnormal liver function 02/29/2016  . Spells 02/29/2016  . Depression 02/29/2016  . Hypertension   . Hyperlipidemia   . Anxiety   . Arthritis of carpometacarpal (CMC) joint of left thumb   . Essential hypertension   . Leukocytosis   . Seizures ([place])    Past Medical History:  Diagnosis Date  . Anxiety   . Arthritis   . Hyperlipidemia   . Hypertension   . Seizures ([place])     Family History  Problem Relation Age of Onset  . Colon cancer Neg Hx   . Stomach cancer Neg Hx   . Diabetes Father   . Heart disease Father   . Hypertension Father   . Alzheimer's disease Mother   . Alzheimer's disease Maternal Grandmother     Past Surgical History:  Procedure Laterality Date  . [place], 2007   left  . GANGLION CYST EXCISION  1998   right wrist  . KNEE ARTHROSCOPY WITH MEDIAL MENISECTOMY Right 09/19/2015   Procedure: RIGHT KNEE ARTHROSCOPY WITH PARTIAL MEDIAL MENISCECTOMY;  Surgeon: Leandrew Koyanagi, MD;  Location: [facility];  Service: Orthopedics;  Laterality: Right;  . removal toe nail  1993   right great toe  . SHOULDER SURGERY  1999   right   Social History   Occupational History  . Not on file.   Social History Main Topics  . Smoking status: Never Smoker  . Smokeless  tobacco: Never Used  . Alcohol use No  . Drug use: No  . Sexual activity: Not on file

## 2016-10-27 ENCOUNTER — Ambulatory Visit (INDEPENDENT_AMBULATORY_CARE_PROVIDER_SITE_OTHER): Payer: Managed Care, Other (non HMO) | Admitting: Neurology

## 2016-10-27 ENCOUNTER — Encounter: Payer: Self-pay | Admitting: Neurology

## 2016-10-27 VITALS — HR 67 | Ht 72.0 in | Wt 220.0 lb

## 2016-10-27 DIAGNOSIS — R569 Unspecified convulsions: Secondary | ICD-10-CM

## 2016-10-27 DIAGNOSIS — J9601 Acute respiratory failure with hypoxia: Secondary | ICD-10-CM | POA: Diagnosis not present

## 2016-10-27 NOTE — Patient Instructions (Signed)
I am glad you are doing well. Follow up in 6 months.

## 2016-10-27 NOTE — Progress Notes (Signed)
NEUROLOGY FOLLOW UP OFFICE NOTE  Nathan Ballard [id number]  HISTORY OF PRESENT ILLNESS: Nathan Ballard is a 55 year old man with hypertension and hyperlipidemia who follows up for spells associated with acute respiratory failure.  He is accompanied by his wife who provides some history as well.   UPDATE: His last event was 04/14/16.  He is feeling well.     HISTORY: He began experiencing several spells since March of this year.  He has had recurrent episodes in which he is unable to be woken up from a deep sleep with labored breathing.  It would last 10 to 15 minutes.  After he wakes up, he is either lucid or somewhat confused.  On a couple of occasions, he was given Narcan by EMS and he awoke.  However, UDS was negative so it was thought to be coincidental.  He had several ED visits.  On 02/28/16, he was admitted to [facility] following another spell.  He was started on Keppra 500mg  twice daily.  CT of head was unremarkable.  MRI of brain only revealed a right frontal developmental venous anomaly (stable compared to MRI in 2008), but no acute process.  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  He became unresponsive in the hospital and had an O2 sat of 19% and was making gurgling noises.  He was intubated for airway protection.  He was hypotensive but did not lose pulse. Echocardiogram was unremarkable and showed EF 55-60% with grade 1 diastolic dysfunction.  Routine EEG and overnight EEG were normal.  He was discharged on Keppra 1500mg  twice daily.  Acute hypoxic and hypercapnic respiratory failure was felt to be secondary to seizure.  He returned to the ED on 03/22/16 after another spell.  He was started on Depakote.  Later that evening, he was standing in the kitchen and developed a glassy look in his eyes.  He was slow to respond.  He was sitting and awake  but started to shake all over, followed by loss of consciousness.  Otherwise, he does not demonstrate any convulsions.  His wife drove him to nearby fire station for vitals, where his O2 sat was 68% on room air and 86% on O2.  He returned to the ED. Blood pressure was in the 90s/60s and pulse in the 40s.  He was given a loading dose of IV Depacon.   He continued to have spells in which he seems a little confused.  He does not demonstrate tongue biting or urinary/bowel incontinence.  Blood sugar has always been okay.   He was readmitted to the hospital on 03/29/16.  Long-term EEG was unremarkable.  Keppra was discontinued and he was started on gabapentin 300mg  three times daily.  He remained on Depakote.     72 hour EEG from 04/09/16 to 04/11/16 was normal but did not capture his habitual spells.  He had another spell on 04/14/16.  He was seen by pulmonology and cardiology, both did not suspect primary pulmonary or cardiac etiology.  Since then, he has been doing well.  He had taken himself off Depakote and gabapentin, as well as Cymbalta.  He has not had any other spells.  He has not had any apneic episodes during sleep.  He feels better.  His memory is better.  He was referred to the EMU at [facility] for further evaluation, as well  as a sleep study, but he cancelled both since he had been feeling well.   He had a similar episode of confusion in 2008.  MRI of brain with and without contrast on 11/10/07 demonstrated the incidental developmental venous anomaly.  In hindsight, his wife thinks he probably has had recurrent spells of confusion over the past few years.   He denies history of meningitis or head trauma.  He had an uncomplicated birth. He has no family history of seizures.  PAST MEDICAL HISTORY: Past Medical History:  Diagnosis Date  . Anxiety   . Arthritis   . Hyperlipidemia   . Hypertension   . Seizures ([place])     MEDICATIONS: Current Outpatient Prescriptions on File Prior to Visit  Medication  Sig Dispense Refill  . atorvastatin (LIPITOR) 40 MG tablet Take 20 mg by mouth daily.     . diclofenac sodium (VOLTAREN) 1 % GEL Apply 2 g topically 4 (four) times daily. 1 Tube 5  . Misc Natural Products (OSTEO BI-FLEX JOINT SHIELD) TABS Take 2 tablets by mouth daily.    . Multiple Vitamin (MULTIVITAMIN) tablet Take 1 tablet by mouth daily.    . nebivolol (BYSTOLIC) 10 MG tablet Take 10 mg by mouth daily.    [place] Kitchen HYDROcodone-acetaminophen (NORCO) 5-325 MG tablet Take 1 tablet by mouth daily as needed. (Patient not taking: Reported on 10/27/2016) 10 tablet 0   No current facility-administered medications on file prior to visit.     ALLERGIES: Allergies  Allergen Reactions  . Morphine And Related Nausea And Vomiting    FAMILY HISTORY: Family History  Problem Relation Age of Onset  . Colon cancer Neg Hx   . Stomach cancer Neg Hx   . Diabetes Father   . Heart disease Father   . Hypertension Father   . Alzheimer's disease Mother   . Alzheimer's disease Maternal Grandmother     SOCIAL HISTORY: Social History   Social History  . Marital status: Married    Spouse name: N/A  . Number of children: N/A  . Years of education: N/A   Occupational History  . Not on file.   Social History Main Topics  . Smoking status: Never Smoker  . Smokeless tobacco: Never Used  . Alcohol use No  . Drug use: No  . Sexual activity: Not on file   Other Topics Concern  . Not on file   Social History Narrative  . No narrative on file    REVIEW OF SYSTEMS: Constitutional: No fevers, chills, or sweats, no generalized fatigue, change in appetite Eyes: No visual changes, double vision, eye pain Ear, nose and throat: No hearing loss, ear pain, nasal congestion, sore throat Cardiovascular: No chest pain, palpitations Respiratory:  No shortness of breath at rest or with exertion, wheezes GastrointestinaI: No nausea, vomiting, diarrhea, abdominal pain, fecal incontinence Genitourinary:  No dysuria,  urinary retention or frequency Musculoskeletal:  No neck pain, back pain Integumentary: No rash, pruritus, skin lesions Neurological: as above Psychiatric: No depression, insomnia, anxiety Endocrine: No palpitations, fatigue, diaphoresis, mood swings, change in appetite, change in weight, increased thirst Hematologic/Lymphatic:  No purpura, petechiae. Allergic/Immunologic: no itchy/runny eyes, nasal congestion, recent allergic reactions, rashes  PHYSICAL EXAM: Vitals:   10/27/16 1107  Pulse: 67   General: No acute distress.  Patient appears well-groomed.  normal body habitus. Head:  Normocephalic/atraumatic Eyes:  Fundi examined but not visualized Neck: supple, no paraspinal tenderness, full range of motion Heart:  Regular rate and rhythm Lungs:  Clear to  auscultation bilaterally Back: No paraspinal tenderness Neurological Exam: alert and oriented to person, place, and time. Attention span and concentration intact, recent and remote memory intact, fund of knowledge intact.  Speech fluent and not dysarthric, language intact.  CN II-XII intact. Bulk and tone normal, muscle strength 5/5 throughout.  Sensation to light touch  intact.  Deep tendon reflexes 2+ throughout.  Finger to nose testing intact.  Gait normal, Romberg negative  IMPRESSION: Spells with hypoxia and hypercapnia.  Etiology is still not clear.  Semiology still not typical for seizure.  Also, spells resolved after he had taken himself off of antiepileptic medications.  It could still be a central apneic spell.  Could it have been side effect of Cymbalta?  PLAN: We will continue to monitor. Follow up in 6 months.  18 minutes spent face to face with patient, over 50% spent discussing his current status and differential diagnoses.  He is outside of the 6 month window since his last spell, so legally he may resume driving.  Metta Clines, DO  CC:  Jani Gravel, MD

## 2017-03-05 ENCOUNTER — Ambulatory Visit: Payer: Self-pay | Admitting: Surgery

## 2017-03-05 NOTE — H&P (Signed)
  Nathan Ballard 03/05/2017 12:23 PM Location: [facility] Patient #: [id number] DOB: [birth year] Married / Language: Nathan Ballard / Race: White Male  History of Present Illness (Christal Lagerstrom A. Hicks Feick MD; 03/05/2017 12:29 PM) Patient words: Returns to discuss excision of lower abdominal cyst. He had an I&D a few weeks ago and this is healed well but there is residual nodule. It irritates him when the parents that he is wearing rubs against it. He also has a similar nodule on his right dorsal forearm which is been present for several years but has recently started to bother him and he would like it removed as well.  The patient is a 56 year old male.   Allergies Malachy Moan, [place]; 03/05/2017 12:24 PM) MORPHINE Nausea and vomiting Allergies Reconciled  Medication History Malachy Moan, RMA; 03/05/2017 12:24 PM) ALPRAZolam (0.25MG  Tablet, Oral as needed) Active. Atorvastatin Calcium (40MG  Tablet, Oral daily) Active. Diclofenac Sodium (1% Gel, Transdermal as needed) Active. Osteo Bi-Flex Adv Joint Shield (Oral daily) Active. Multi-Minerals (Oral daily) Active. Bystolic (10MG  Tablet, Oral daily) Active. Medications Reconciled    Vitals Malachy Moan RMA; 03/05/2017 12:25 PM) 03/05/2017 12:24 PM Weight: 220 lb Height: 72in Body Surface Area: 2.22 m Body Mass Index: 29.84 kg/m  Temp.: 98.55F  Pulse: 64 (Regular)  BP: 140/82 (Sitting, Left Arm, Standard)      Physical Exam (Vineet Kinney A. Falisa Lamora MD; 03/05/2017 12:29 PM)  The physical exam findings are as follows: Note:3 x 2 cm mobile soft subcutaneous lesion in the right forearm, likely lipoma    Assessment & Plan (Kary Sugrue A. Steve Youngberg MD; 03/05/2017 12:30 PM)  STATUS POST INCISION AND DRAINAGE (Z98.890) Story: We'll plan for simultaneous excision of the lower abdominal subcutaneous cyst and the right forearm lipoma. Counseled as to risks of bleeding and infection. We'll schedule in the coming  weeks.

## 2017-04-22 ENCOUNTER — Encounter (HOSPITAL_BASED_OUTPATIENT_CLINIC_OR_DEPARTMENT_OTHER): Payer: Self-pay | Admitting: *Deleted

## 2017-04-22 NOTE — Progress Notes (Signed)
NPO AFTER MN.  ARRIVE AT 0600.  NEEDS ISTAT AND EKG.  WILL TAKE LIPITOR AND BYSTOLIC AM DOS W/ SIPS OF WATER.  PT VERBALIZED UNDERSTANDING TO DO HIBICLENS SHOWER HS BEFORE AND AM DOS.

## 2017-04-27 ENCOUNTER — Ambulatory Visit: Payer: Managed Care, Other (non HMO) | Admitting: Neurology

## 2017-05-01 ENCOUNTER — Ambulatory Visit (HOSPITAL_BASED_OUTPATIENT_CLINIC_OR_DEPARTMENT_OTHER): Payer: Managed Care, Other (non HMO) | Admitting: Anesthesiology

## 2017-05-01 ENCOUNTER — Encounter (HOSPITAL_BASED_OUTPATIENT_CLINIC_OR_DEPARTMENT_OTHER)
Admission: RE | Disposition: A | Discharge status: Home / Self Care | Payer: Self-pay | Source: Ambulatory Visit | Attending: Surgery

## 2017-05-01 ENCOUNTER — Other Ambulatory Visit: Payer: Self-pay

## 2017-05-01 ENCOUNTER — Encounter (HOSPITAL_BASED_OUTPATIENT_CLINIC_OR_DEPARTMENT_OTHER): Payer: Self-pay | Admitting: *Deleted

## 2017-05-01 ENCOUNTER — Ambulatory Visit (HOSPITAL_BASED_OUTPATIENT_CLINIC_OR_DEPARTMENT_OTHER)
Admission: RE | Admit: 2017-05-01 | Discharge: 2017-05-01 | Disposition: A | Discharge status: Home / Self Care | Payer: Managed Care, Other (non HMO) | Source: Ambulatory Visit | Attending: Surgery | Admitting: Surgery

## 2017-05-01 DIAGNOSIS — D1721 Benign lipomatous neoplasm of skin and subcutaneous tissue of right arm: Secondary | ICD-10-CM | POA: Insufficient documentation

## 2017-05-01 DIAGNOSIS — Z79899 Other long term (current) drug therapy: Secondary | ICD-10-CM | POA: Diagnosis not present

## 2017-05-01 DIAGNOSIS — I10 Essential (primary) hypertension: Secondary | ICD-10-CM | POA: Insufficient documentation

## 2017-05-01 DIAGNOSIS — F419 Anxiety disorder, unspecified: Secondary | ICD-10-CM | POA: Insufficient documentation

## 2017-05-01 DIAGNOSIS — R1907 Generalized intra-abdominal and pelvic swelling, mass and lump: Secondary | ICD-10-CM | POA: Diagnosis present

## 2017-05-01 DIAGNOSIS — L720 Epidermal cyst: Secondary | ICD-10-CM | POA: Diagnosis not present

## 2017-05-01 HISTORY — DX: Unspecified osteoarthritis, unspecified site: M19.90

## 2017-05-01 HISTORY — DX: Hemangioma of intracranial structures: D18.02

## 2017-05-01 HISTORY — DX: Major depressive disorder, single episode, unspecified: F32.9

## 2017-05-01 HISTORY — PX: LIPOMA EXCISION: SHX5283

## 2017-05-01 HISTORY — DX: Other specified postprocedural states: Z98.890

## 2017-05-01 HISTORY — DX: Personal history of other malignant neoplasm of skin: Z85.828

## 2017-05-01 HISTORY — DX: Personal history of other specified conditions: Z87.898

## 2017-05-01 HISTORY — DX: Personal history of colonic polyps: Z86.010

## 2017-05-01 HISTORY — DX: Diverticulosis of large intestine without perforation or abscess without bleeding: K57.30

## 2017-05-01 HISTORY — DX: Presence of spectacles and contact lenses: Z97.3

## 2017-05-01 HISTORY — DX: Follicular cyst of the skin and subcutaneous tissue, unspecified: L72.9

## 2017-05-01 LAB — POCT I-STAT 4, (NA,K, GLUC, HGB,HCT)
Glucose, Bld: 94 mg/dL (ref 65–99)
HEMATOCRIT: 39 % (ref 39.0–52.0)
HEMOGLOBIN: 13.3 g/dL (ref 13.0–17.0)
POTASSIUM: 4 mmol/L (ref 3.5–5.1)
SODIUM: 143 mmol/L (ref 135–145)

## 2017-05-01 SURGERY — EXCISION LIPOMA
Anesthesia: General | Site: Abdomen | Laterality: Right

## 2017-05-01 MED ORDER — FENTANYL CITRATE (PF) 100 MCG/2ML IJ SOLN
INTRAMUSCULAR | Status: DC | PRN
  Administered 2017-05-01 (×2): 50 ug via INTRAVENOUS

## 2017-05-01 MED ORDER — MIDAZOLAM HCL 5 MG/5ML IJ SOLN
INTRAMUSCULAR | Status: DC | PRN
  Administered 2017-05-01: 2 mg via INTRAVENOUS

## 2017-05-01 MED ORDER — SODIUM CHLORIDE 0.9 % IV SOLN
250.0000 mL | INTRAVENOUS | Status: DC | PRN
  Filled 2017-05-01: qty 250

## 2017-05-01 MED ORDER — FENTANYL CITRATE (PF) 100 MCG/2ML IJ SOLN
25.0000 ug | INTRAMUSCULAR | Status: DC | PRN
  Filled 2017-05-01: qty 1

## 2017-05-01 MED ORDER — GLYCOPYRROLATE 0.2 MG/ML IV SOSY
PREFILLED_SYRINGE | INTRAVENOUS | Status: DC | PRN
  Administered 2017-05-01 (×2): .2 mg via INTRAVENOUS

## 2017-05-01 MED ORDER — ACETAMINOPHEN 650 MG RE SUPP
650.0000 mg | RECTAL | Status: DC | PRN
  Filled 2017-05-01: qty 1

## 2017-05-01 MED ORDER — EPHEDRINE SULFATE-NACL 50-0.9 MG/10ML-% IV SOSY
PREFILLED_SYRINGE | INTRAVENOUS | Status: DC | PRN
Start: 2017-05-01 — End: 2017-05-01
  Administered 2017-05-01 (×2): 10 mg via INTRAVENOUS

## 2017-05-01 MED ORDER — CEFAZOLIN SODIUM-DEXTROSE 2-4 GM/100ML-% IV SOLN
2.0000 g | INTRAVENOUS | Status: AC
  Administered 2017-05-01: 2 g via INTRAVENOUS
  Filled 2017-05-01: qty 100

## 2017-05-01 MED ORDER — FENTANYL CITRATE (PF) 100 MCG/2ML IJ SOLN
INTRAMUSCULAR | Status: AC
  Filled 2017-05-01: qty 2

## 2017-05-01 MED ORDER — DOCUSATE SODIUM 100 MG PO CAPS: 100.0000 mg | ORAL_CAPSULE | Freq: Two times a day (BID) | ORAL | 0 refills | Status: AC

## 2017-05-01 MED ORDER — OXYCODONE HCL 5 MG PO TABS
ORAL_TABLET | ORAL | Status: AC
  Filled 2017-05-01: qty 1

## 2017-05-01 MED ORDER — DEXAMETHASONE SODIUM PHOSPHATE 10 MG/ML IJ SOLN
INTRAMUSCULAR | Status: AC
  Filled 2017-05-01: qty 1

## 2017-05-01 MED ORDER — SODIUM CHLORIDE 0.9% FLUSH
3.0000 mL | INTRAVENOUS | Status: DC | PRN
  Filled 2017-05-01: qty 3

## 2017-05-01 MED ORDER — CEFAZOLIN SODIUM-DEXTROSE 2-4 GM/100ML-% IV SOLN
INTRAVENOUS | Status: AC
  Filled 2017-05-01: qty 100

## 2017-05-01 MED ORDER — PROPOFOL 10 MG/ML IV BOLUS
INTRAVENOUS | Status: AC
  Filled 2017-05-01: qty 40

## 2017-05-01 MED ORDER — LIDOCAINE 2% (20 MG/ML) 5 ML SYRINGE
INTRAMUSCULAR | Status: DC | PRN
  Administered 2017-05-01: 100 mg via INTRAVENOUS

## 2017-05-01 MED ORDER — TRAMADOL HCL 50 MG PO TABS: 50.0000 mg | ORAL_TABLET | Freq: Four times a day (QID) | ORAL | 0 refills | Status: DC | PRN

## 2017-05-01 MED ORDER — MEPERIDINE HCL 25 MG/ML IJ SOLN
6.2500 mg | INTRAMUSCULAR | Status: DC | PRN
  Filled 2017-05-01: qty 1

## 2017-05-01 MED ORDER — ONDANSETRON HCL 4 MG/2ML IJ SOLN
INTRAMUSCULAR | Status: DC | PRN
  Administered 2017-05-01: 4 mg via INTRAVENOUS

## 2017-05-01 MED ORDER — PROMETHAZINE HCL 25 MG/ML IJ SOLN
6.2500 mg | INTRAMUSCULAR | Status: DC | PRN
  Filled 2017-05-01: qty 1

## 2017-05-01 MED ORDER — KETOROLAC TROMETHAMINE 30 MG/ML IJ SOLN
INTRAMUSCULAR | Status: DC | PRN
  Administered 2017-05-01: 30 mg via INTRAVENOUS

## 2017-05-01 MED ORDER — MIDAZOLAM HCL 2 MG/2ML IJ SOLN
0.5000 mg | Freq: Once | INTRAMUSCULAR | Status: DC | PRN
Start: 2017-05-01 — End: 2017-05-01
  Filled 2017-05-01: qty 2

## 2017-05-01 MED ORDER — CHLORHEXIDINE GLUCONATE 4 % EX LIQD
60.0000 mL | Freq: Once | CUTANEOUS | Status: DC
  Filled 2017-05-01: qty 118

## 2017-05-01 MED ORDER — ARTIFICIAL TEARS OPHTHALMIC OINT
TOPICAL_OINTMENT | OPHTHALMIC | Status: AC
  Filled 2017-05-01: qty 3.5

## 2017-05-01 MED ORDER — SODIUM CHLORIDE 0.9% FLUSH
3.0000 mL | Freq: Two times a day (BID) | INTRAVENOUS | Status: DC
  Filled 2017-05-01: qty 3

## 2017-05-01 MED ORDER — PROPOFOL 10 MG/ML IV BOLUS
INTRAVENOUS | Status: DC | PRN
  Administered 2017-05-01: 200 mg via INTRAVENOUS

## 2017-05-01 MED ORDER — KETOROLAC TROMETHAMINE 30 MG/ML IJ SOLN
INTRAMUSCULAR | Status: AC
Start: 2017-05-01 — End: 2017-05-01
  Filled 2017-05-01: qty 1

## 2017-05-01 MED ORDER — BUPIVACAINE-EPINEPHRINE 0.25% -1:200000 IJ SOLN
INTRAMUSCULAR | Status: DC | PRN
  Administered 2017-05-01: 8 mL

## 2017-05-01 MED ORDER — OXYCODONE HCL 5 MG PO TABS
5.0000 mg | ORAL_TABLET | ORAL | Status: DC | PRN
  Administered 2017-05-01: 5 mg via ORAL
  Filled 2017-05-01: qty 2

## 2017-05-01 MED ORDER — ONDANSETRON HCL 4 MG/2ML IJ SOLN
INTRAMUSCULAR | Status: AC
  Filled 2017-05-01: qty 2

## 2017-05-01 MED ORDER — LACTATED RINGERS IV SOLN
INTRAVENOUS | Status: DC
  Administered 2017-05-01: 07:00:00 via INTRAVENOUS
  Filled 2017-05-01: qty 1000

## 2017-05-01 MED ORDER — GLYCOPYRROLATE 0.2 MG/ML IV SOSY
PREFILLED_SYRINGE | INTRAVENOUS | Status: AC
  Filled 2017-05-01: qty 5

## 2017-05-01 MED ORDER — ACETAMINOPHEN 325 MG PO TABS
650.0000 mg | ORAL_TABLET | ORAL | Status: DC | PRN
  Filled 2017-05-01: qty 2

## 2017-05-01 MED ORDER — MIDAZOLAM HCL 2 MG/2ML IJ SOLN
INTRAMUSCULAR | Status: AC
  Filled 2017-05-01: qty 2

## 2017-05-01 MED ORDER — EPHEDRINE 5 MG/ML INJ
INTRAVENOUS | Status: AC
  Filled 2017-05-01: qty 10

## 2017-05-01 MED ORDER — LIDOCAINE 2% (20 MG/ML) 5 ML SYRINGE
INTRAMUSCULAR | Status: AC
  Filled 2017-05-01: qty 5

## 2017-05-01 MED ORDER — DEXAMETHASONE SODIUM PHOSPHATE 4 MG/ML IJ SOLN
INTRAMUSCULAR | Status: DC | PRN
  Administered 2017-05-01: 10 mg via INTRAVENOUS

## 2017-05-01 SURGICAL SUPPLY — 39 items
ADH SKN CLS APL DERMABOND .7 (GAUZE/BANDAGES/DRESSINGS) ×1
BLADE SURG 15 STRL LF DISP TIS (BLADE) ×1 IMPLANT
BLADE SURG 15 STRL SS (BLADE) ×4
BNDG COHESIVE 4X5 TAN STRL (GAUZE/BANDAGES/DRESSINGS) ×1 IMPLANT
COVER MAYO STAND STRL (DRAPES) ×2 IMPLANT
COVER TABLE BACK 60X90 (DRAPES) ×2 IMPLANT
DECANTER SPIKE VIAL GLASS SM (MISCELLANEOUS) IMPLANT
DERMABOND ADVANCED (GAUZE/BANDAGES/DRESSINGS) ×1
DERMABOND ADVANCED .7 DNX12 (GAUZE/BANDAGES/DRESSINGS) ×1 IMPLANT
DRAPE LAPAROTOMY 100X72 PEDS (DRAPES) ×1 IMPLANT
DRAPE LAPAROTOMY TRNSV 102X78 (DRAPE) IMPLANT
DRAPE U 20/CS (DRAPES) ×1 IMPLANT
DRAPE UTILITY XL STRL (DRAPES) ×2 IMPLANT
GAUZE SPONGE 4X4 12PLY STRL (GAUZE/BANDAGES/DRESSINGS) ×2 IMPLANT
GLOVE BIO SURGEON STRL SZ 6 (GLOVE) ×2 IMPLANT
GLOVE INDICATOR 6.5 STRL GRN (GLOVE) ×2 IMPLANT
GOWN STRL REUS W/TWL LRG LVL3 (GOWN DISPOSABLE) ×2 IMPLANT
GOWN STRL REUS W/TWL XL LVL3 (GOWN DISPOSABLE) ×2 IMPLANT
NDL HYPO 25X1 1.5 SAFETY (NEEDLE) IMPLANT
NEEDLE HYPO 22GX1.5 SAFETY (NEEDLE) ×1 IMPLANT
NEEDLE HYPO 25X1 1.5 SAFETY (NEEDLE) IMPLANT
PACK BASIN DAY SURGERY FS (CUSTOM PROCEDURE TRAY) ×2 IMPLANT
PENCIL BUTTON HOLSTER BLD 10FT (ELECTRODE) ×2 IMPLANT
SPONGE LAP 18X18 X RAY DECT (DISPOSABLE) ×1 IMPLANT
SPONGE LAP 4X18 X RAY DECT (DISPOSABLE) IMPLANT
STAPLER VISISTAT 35W (STAPLE) IMPLANT
STOCKINETTE IMPERVIOUS LG (DRAPES) ×1 IMPLANT
SUCTION FRAZIER TIP 10 FR DISP (SUCTIONS) IMPLANT
SUT MNCRL AB 4-0 PS2 18 (SUTURE) ×2 IMPLANT
SUT VIC AB 2-0 SH 27 (SUTURE)
SUT VIC AB 2-0 SH 27XBRD (SUTURE) ×1 IMPLANT
SUT VIC AB 3-0 SH 27 (SUTURE) ×2
SUT VIC AB 3-0 SH 27X BRD (SUTURE) ×1 IMPLANT
SYR BULB IRRIGATION 50ML (SYRINGE) ×2 IMPLANT
SYR CONTROL 10ML LL (SYRINGE) ×2 IMPLANT
TOWEL OR 17X24 6PK STRL BLUE (TOWEL DISPOSABLE) ×4 IMPLANT
TOWEL OR NON WOVEN STRL DISP B (DISPOSABLE) ×1 IMPLANT
TUBE CONNECTING 12X1/4 (SUCTIONS) ×1 IMPLANT
YANKAUER SUCT BULB TIP NO VENT (SUCTIONS) ×1 IMPLANT

## 2017-05-01 NOTE — H&P (Signed)
Nathan Ballard Patient #: [id number] DOB: 1961-04-03 Married / Language: English / Race: White Male  History of Present Illness  Patient words: Returns to discuss excision of lower abdominal cyst. He had an I&D a few weeks ago and this is healed well but there is residual nodule. It irritates him when the pants that he is wearing rubs against it. He also has a similar nodule on his right dorsal forearm which has been present for several years but has recently started to bother him and he would like it removed as well.   Allergies  MORPHINE Nausea and vomiting Allergies Reconciled  Medication History  ALPRAZolam (0.25MG  Tablet, Oral as needed) Active. Atorvastatin Calcium (40MG  Tablet, Oral daily) Active. Diclofenac Sodium (1% Gel, Transdermal as needed) Active. Osteo Bi-Flex Adv Joint Shield (Oral daily) Active. Multi-Minerals (Oral daily) Active. Bystolic (10MG  Tablet, Oral daily) Active. Medications Reconciled    Vitals:   05/01/17 0605  BP: 118/67  Pulse: (!) 54  Resp: 19  Temp: 98.3 F (36.8 C)      Physical Exam   The physical exam findings are as follows: Note:3 x 2 cm mobile soft subcutaneous lesion in the right forearm, likely lipoma    Assessment & Plan   STATUS POST INCISION AND DRAINAGE (Z99.357) Story: We'll plan for simultaneous excision of the lower abdominal subcutaneous cyst and the right forearm lipoma. Counseled as to risks of bleeding and infection.

## 2017-05-01 NOTE — Op Note (Signed)
Operative Note  LACEY DOTSON  [id number]  [id number]  05/01/2017   Surgeon: Clovis Riley  Assistant: OR staff  Procedure performed: Excision of 1cm subcutaneous cyst from suprapubic area; excision of 3x2cm lipoma from subcutaneous right forearm  Preop diagnosis: Subcutaneous cyst suprapubic area, lipoma right forearm Post-op diagnosis/intraop findings: same  Specimens: Subcutaneous cyst suprapubic area, lipoma right forearm Retained items: none EBL: minimal  Complications: none  Description of procedure: After obtaining informed consent the patient was taken to the operating room and placed supine on operating room table wheregeneral  Anesthesia with LMA was initiated, preoperative antibiotics were administered, SCDs applied, and a formal timeout was performed. The lower abdomen and right forearm were prepped and draped in the usual sterile fashion. After infiltration with local, an elliptical incision was made over the nodule in the suprapubic area including the scar from prior incision and drainage. The dermis and soft tissues were dissected using cautery and blunt dissection to excise the small subcutaneous cyst which was then passed off for pathology. The wound was irrigated and hemostasis ensured. This wound was closed with a deep dermal 3-0 Vicryl followed by running subcuticular Monocryl and Dermabond. I then turned to the right forearm where the patient had a lipoma on the medial dorsal aspect of the proximal forearm. Local was again infiltrated here and then the 3 severe incision was made sharply and then the dermis and soft tissues dissected with cautery until lipoma was encountered. This was bluntly dissected into the field and then excised with cautery. The wound was irrigated and hemostasis ensured. This incision was also closed with deep dermal 3-0 Vicryls and running subcuticular monocryl followed by Dermabond. The patient was then awakened, extubated and taken to  PACU in stable condition.   All counts were correct at the completion of the case.

## 2017-05-01 NOTE — Anesthesia Preprocedure Evaluation (Addendum)
Anesthesia Evaluation  Patient identified by MRN, date of birth, ID band Patient awake    Reviewed: Allergy & Precautions, NPO status , Patient's Chart, lab work & pertinent test results, reviewed documented beta blocker date and time   History of Anesthesia Complications Negative for: history of anesthetic complications  Airway Mallampati: I  TM Distance: >3 FB Neck ROM: Full    Dental  (+) Dental Advisory Given   Pulmonary neg pulmonary ROS,    breath sounds clear to auscultation       Cardiovascular hypertension, Pt. on medications and Pt. on home beta blockers  Rhythm:Regular Rate:Normal  '17 ECHO: EF 55-60%, valves OK   Neuro/Psych Seizures - (last sz in 2017), Well Controlled,  Anxiety Depression    GI/Hepatic negative GI ROS, Neg liver ROS,   Endo/Other  negative endocrine ROS  Renal/GU negative Renal ROS     Musculoskeletal  (+) Arthritis , Osteoarthritis,    Abdominal   Peds  Hematology negative hematology ROS (+)   Anesthesia Other Findings   Reproductive/Obstetrics                            Anesthesia Physical Anesthesia Plan  ASA: II  Anesthesia Plan: General   Post-op Pain Management:    Induction: Intravenous  PONV Risk Score and Plan: 2 and Ondansetron and Dexamethasone  Airway Management Planned: LMA  Additional Equipment:   Intra-op Plan:   Post-operative Plan:   Informed Consent: I have reviewed the patients History and Physical, chart, labs and discussed the procedure including the risks, benefits and alternatives for the proposed anesthesia with the patient or authorized representative who has indicated his/her understanding and acceptance.   Dental advisory given  Plan Discussed with: CRNA and Surgeon  Anesthesia Plan Comments: (Plan routine monitors, GA- LMA OK)        Anesthesia Quick Evaluation

## 2017-05-01 NOTE — Discharge Instructions (Signed)
[place] Office Phone Number [phone]  POST OP INSTRUCTIONS  Always review your discharge instruction sheet given to you by the facility where your surgery was performed.  IF YOU HAVE DISABILITY OR FAMILY LEAVE FORMS, YOU MUST BRING THEM TO THE OFFICE FOR PROCESSING.  DO NOT GIVE THEM TO YOUR DOCTOR.  1. A prescription for pain medication may be given to you upon discharge.  Take your pain medication as prescribed, if needed.  If narcotic pain medicine is not needed, then you may take acetaminophen (Tylenol) or ibuprofen (Advil) as needed. 2. Take your usually prescribed medications unless otherwise directed 3. If you need a refill on your pain medication, please contact your pharmacy.  They will contact our office to request authorization.  Prescriptions will not be filled after 5pm or on week-ends. 4. You should eat very light the first 24 hours after surgery, such as soup, crackers, pudding, etc.  Resume your normal diet the day after surgery. 5. Most patients will experience some swelling and bruising in the surgical site.  Ice packs will help.  Swelling and bruising can take several days to resolve.  6. It is common to experience some constipation if taking pain medication after surgery.  Increasing fluid intake and taking a stool softener will usually help or prevent this problem from occurring.  A mild laxative (Milk of Magnesia or Miralax) should be taken according to package directions if there are no bowel movements after 48 hours. 7. Unless discharge instructions indicate otherwise, you may remove your bandages 24-48 hours after surgery, and you may shower at that time.  You may have steri-strips (small skin tapes) in place directly over the incision.  These strips should be left on the skin for 7-10 days.  If your surgeon used skin glue on the incision, you may shower in 24 hours.  The glue will flake off over the next 2-3 weeks.  Any sutures or staples will be  removed at the office during your follow-up visit. 8. ACTIVITIES:  You may resume regular daily activities (gradually increasing) beginning the next day.  Wearing a good support bra or sports bra minimizes pain and swelling.  You may have sexual intercourse when it is comfortable. a. You may drive when you no longer are taking prescription pain medication, you can comfortably wear a seatbelt, and you can safely maneuver your car and apply brakes. b. RETURN TO WORK:  _3-5 days_____________________________________________________________________________________ 9. You should see your doctor in the office for a follow-up appointment approximately two weeks after your surgery.    WHEN TO CALL YOUR DOCTOR: 1. Fever over 101.0 2. Nausea and/or vomiting. 3. Extreme swelling or bruising. 4. Continued bleeding from incision. 5. Increased pain, redness, or drainage from the incision.  The clinic staff is available to answer your questions during regular business hours.  Please dont hesitate to call and ask to speak to one of the nurses for clinical concerns.  If you have a medical emergency, go to the nearest emergency room or call 911.  A surgeon from [facility] is always on call at the hospital.  For further questions, please visit [email or web address]   Post Anesthesia Home Care Instructions  Activity: Get plenty of rest for the remainder of the day. A responsible individual must stay with you for 24 hours following the procedure.  For the next 24 hours, DO NOT: -Drive a car -Paediatric nurse -Drink alcoholic beverages -Take any medication unless instructed by your physician -Make any legal  decisions or sign important papers.  Meals: Start with liquid foods such as gelatin or soup. Progress to regular foods as tolerated. Avoid greasy, spicy, heavy foods. If nausea and/or vomiting occur, drink only clear liquids until the nausea and/or vomiting subsides. Call your physician  if vomiting continues.  Special Instructions/Symptoms: Your throat may feel dry or sore from the anesthesia or the breathing tube placed in your throat during surgery. If this causes discomfort, gargle with warm salt water. The discomfort should disappear within 24 hours.  If you had a scopolamine patch placed behind your ear for the management of post- operative nausea and/or vomiting:  1. The medication in the patch is effective for 72 hours, after which it should be removed.  Wrap patch in a tissue and discard in the trash. Wash hands thoroughly with soap and water. 2. You may remove the patch earlier than 72 hours if you experience unpleasant side effects which may include dry mouth, dizziness or visual disturbances. 3. Avoid touching the patch. Wash your hands with soap and water after contact with the patch.

## 2017-05-01 NOTE — Transfer of Care (Signed)
Last Vitals:  Vitals:   05/01/17 0605 05/01/17 0842  BP: 118/67 (P) 122/71  Pulse: (!) 54   Resp: 19 (!) (P) 8  Temp: 36.8 C (P) 36.4 C    Last Pain:  Vitals:   05/01/17 8242  TempSrc: Oral      Patients Stated Pain Goal: 4 (05/01/17 3536)  Immediate Anesthesia Transfer of Care Note  Patient: Nathan Ballard  Procedure(s) Performed: Procedure(s) (LRB): EXCISION OF CYST FROM RIGHT FOREARM AND LOWER ABDOMEN (Right)  Patient Location: PACU  Anesthesia Type: General  Level of Consciousness: awake, alert  and oriented  Airway & Oxygen Therapy: Patient Spontanous Breathing and Patient connected to nasal cannula oxygen  Post-op Assessment: Report given to PACU RN and Post -op Vital signs reviewed and stable  Post vital signs: Reviewed and stable  Complications: No apparent anesthesia complications

## 2017-05-01 NOTE — Anesthesia Postprocedure Evaluation (Signed)
Anesthesia Post Note  Patient: Nathan Ballard  Procedure(s) Performed: Procedure(s) (LRB): EXCISION OF CYST FROM RIGHT FOREARM AND LOWER ABDOMEN (Right)     Patient location during evaluation: PACU Anesthesia Type: General Level of consciousness: awake and alert, oriented and patient cooperative Pain management: pain level controlled Vital Signs Assessment: post-procedure vital signs reviewed and stable Respiratory status: spontaneous breathing, nonlabored ventilation and respiratory function stable Cardiovascular status: blood pressure returned to baseline and stable Postop Assessment: no signs of nausea or vomiting Anesthetic complications: no    Last Vitals:  Vitals:   05/01/17 0915 05/01/17 0958  BP: 127/77 122/73  Pulse: 62 60  Resp: 18 14  Temp:  36.8 C    Last Pain:  Vitals:   05/01/17 0945  TempSrc:   PainSc: 3                  Oleva Koo,E. Jefferey Lippmann

## 2017-05-01 NOTE — Anesthesia Procedure Notes (Signed)
Procedure Name: LMA Insertion Date/Time: 05/01/2017 7:43 AM Performed by: Annye Asa Pre-anesthesia Checklist: Patient identified, Emergency Drugs available, Suction available and Patient being monitored Patient Re-evaluated:Patient Re-evaluated prior to inductionOxygen Delivery Method: Circle system utilized Preoxygenation: Pre-oxygenation with 100% oxygen Intubation Type: IV induction Ventilation: Mask ventilation without difficulty LMA: LMA inserted LMA Size: 5.0 Number of attempts: 1 Airway Equipment and Method: Bite block Placement Confirmation: positive ETCO2 Tube secured with: Tape Dental Injury: Teeth and Oropharynx as per pre-operative assessment

## 2017-05-04 ENCOUNTER — Encounter (HOSPITAL_BASED_OUTPATIENT_CLINIC_OR_DEPARTMENT_OTHER): Payer: Self-pay | Admitting: Surgery

## 2017-08-10 ENCOUNTER — Encounter: Payer: Self-pay | Admitting: Internal Medicine

## 2017-09-29 ENCOUNTER — Telehealth: Payer: Self-pay | Admitting: *Deleted

## 2017-09-29 NOTE — Telephone Encounter (Signed)
John,  Will you please review this chart and make sure this pt is okay for the [place].  He has a hx of acute respiratory failure with intubation and  seizure activity  03-29-2016.  It appears  since he has been off all seizure meds he has had no further activity- last episode was 04-2016.  I just wanted to be sure he was lec readt.  Thanks a lot, Lelan Pons

## 2017-09-30 NOTE — Telephone Encounter (Signed)
[name],  This pt is cleared for anesthetic care at [facility].  Thanks,  Kiril Hippe 

## 2017-10-07 IMAGING — XA IR ANGIO INTRA EXTRACRAN SEL COM CAROTID INNOMINATE BILAT MOD SE
1 series · 12 of 24 positions shown · IV contrast (IODINE)
Comparison: CT angiogram of the head and neck of 03/01/2016.

INDICATION: History of new onset seizures. Abnormal CT angiogram of the neck
revealing prominence of vessels supplying the tongue on the left
side.

EXAM:
IR ANGIO INTRA EXTRACRAN SEL COM CAROTID INNOMINATE UNI RIGHT MOD
SED BILATERAL COMMON CAROTID ARTERY, LEFT EXTERNAL CAROTID ARTERY,
AND BILATERAL VERTEBRAL ARTERY ANGIOGRAMS:
TECHNIQUE: Informed written consent was obtained from the patient after a
thorough discussion of the procedural risks, benefits and
alternatives. All questions were addressed. Maximal Sterile Barrier
Technique was utilized including caps, mask, sterile gowns, sterile
gloves, sterile drape, hand hygiene and skin antiseptic. A timeout
was performed prior to the initiation of the procedure.

[Series 300: dr. (person_name) · 12 of 276 slices shown]
[im 12/276]
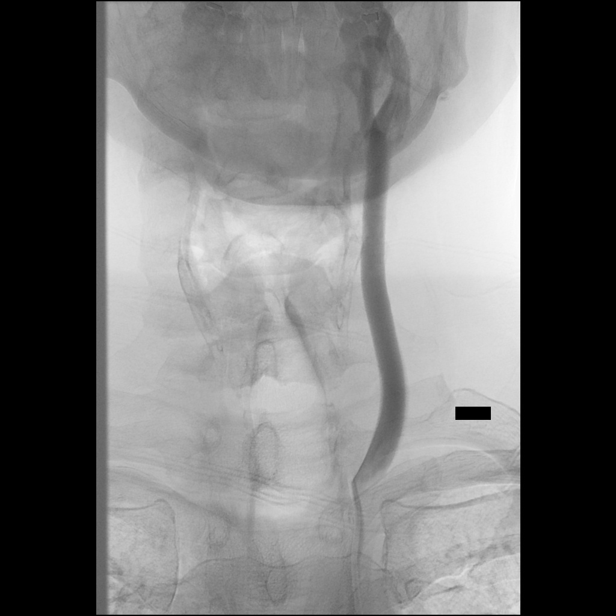
[im 36/276]
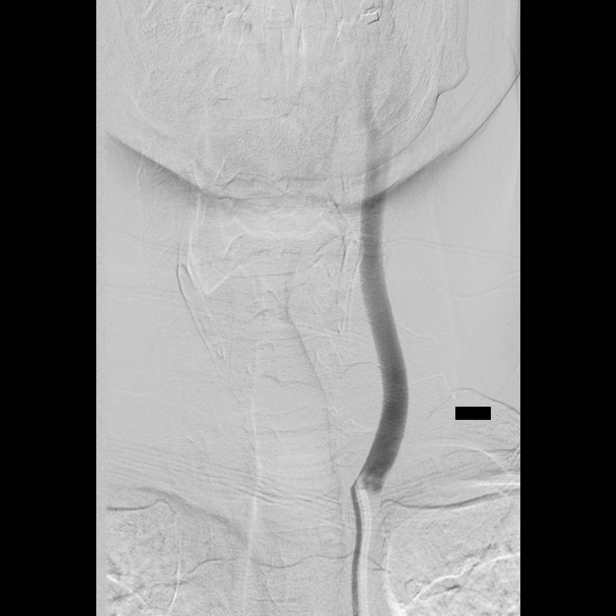
[im 60/276]
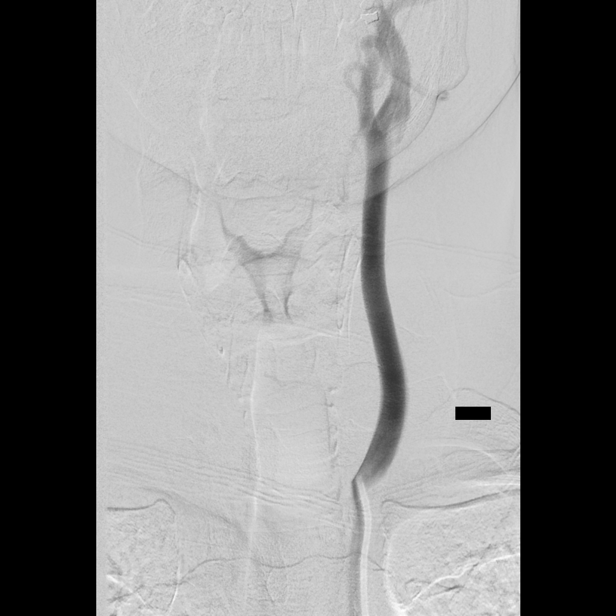
[im 84/276]
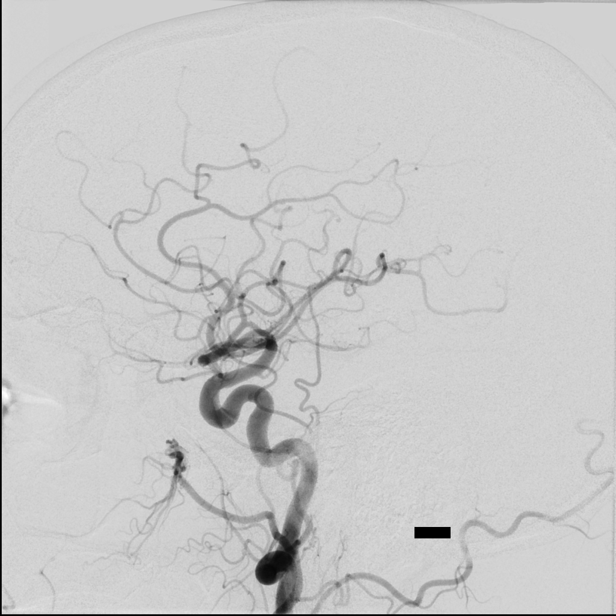
[im 108/276]
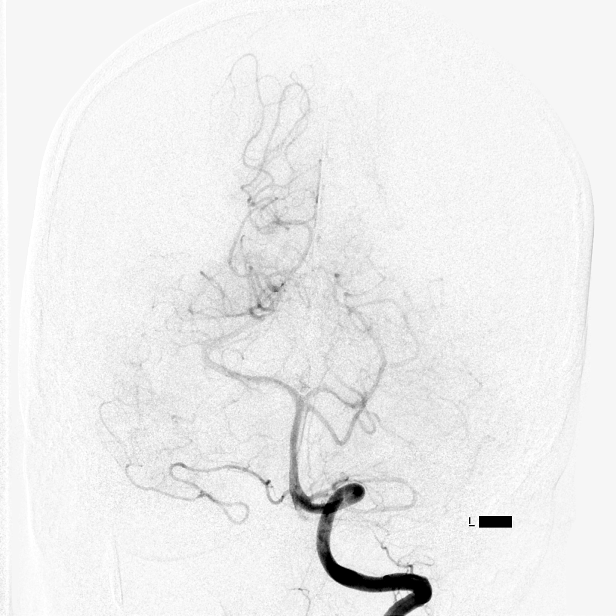
[im 132/276]
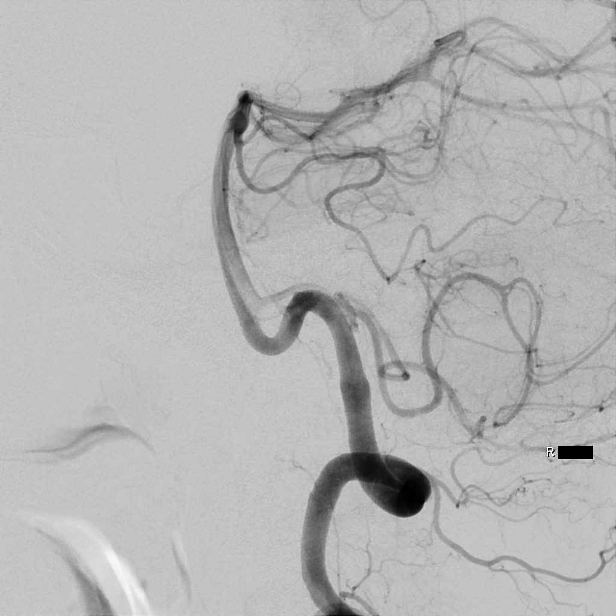
[im 156/276]
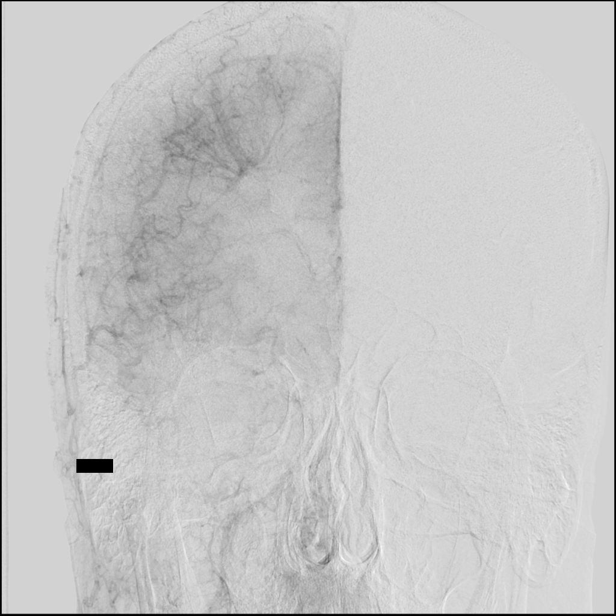
[im 180/276]
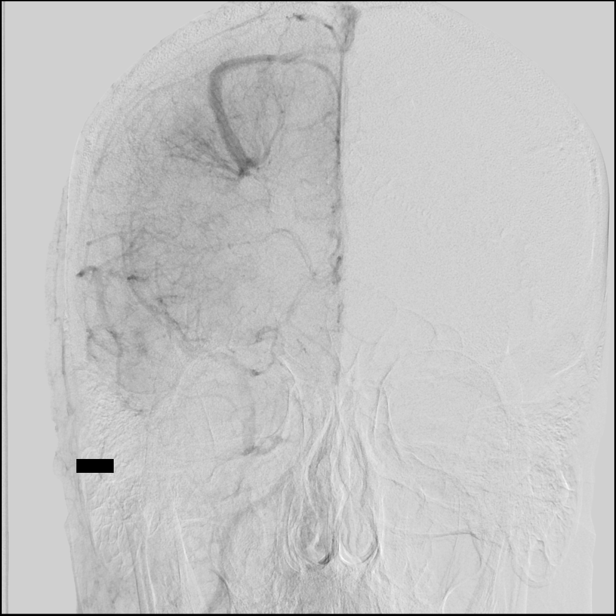
[im 204/276]
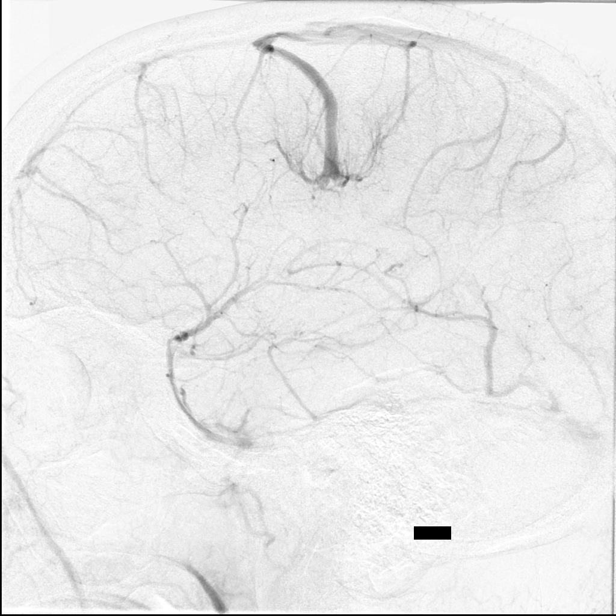
[im 228/276]
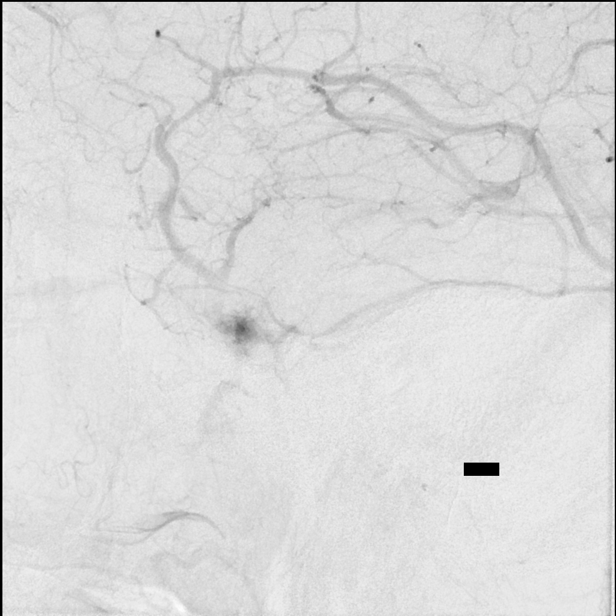
[im 252/276]
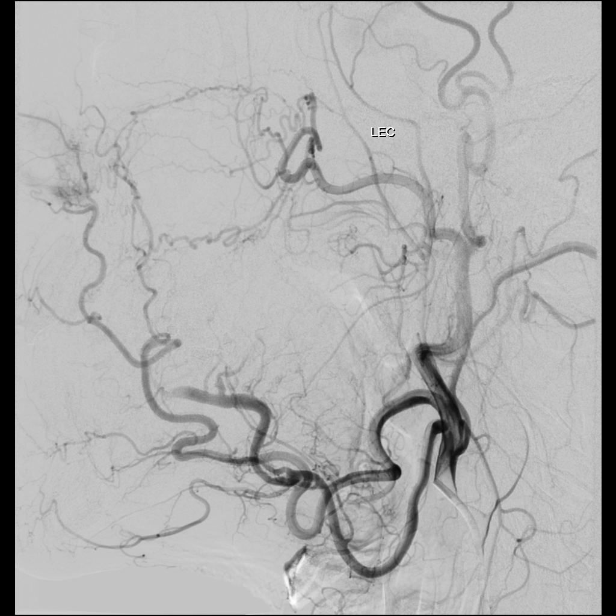
[im 276/276]
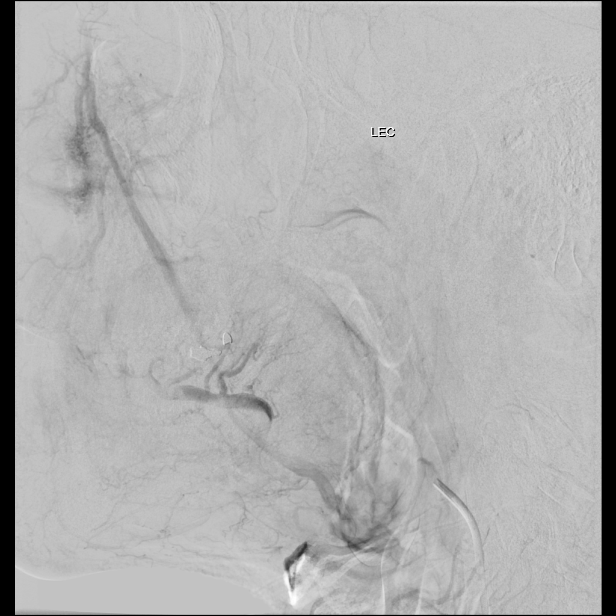

[12 of 24 positions shown; findings below may reference images not displayed]

MEDICATIONS:
None.

ANESTHESIA/SEDATION:
Versed 2 mg IV; Fentanyl 50 mcg IV.

Moderate Sedation Time:  40 minutes.

The patient was continuously monitored during the procedure by the
interventional radiology nurse under my direct supervision.

CONTRAST:  Omnipaque 300 approximately 60 mL

FLUOROSCOPY TIME:  Fluoroscopy Time: 20 minutes 42 seconds (2776
mGy).

COMPLICATIONS:
None immediate.
The right groin was prepped and draped in the usual sterile fashion.
Thereafter using modified Seldinger technique, transfemoral access
into the right common femoral artery was obtained without
difficulty. Over a 0.035 inch guidewire a 5 French Pinnacle sheath
was inserted. Through this, and also over a 0.035 inch guidewire a 5
French JB 1 catheter was advanced to the aortic arch region and
selectively positioned in the right vertebral artery, right common
carotid artery, the left common carotid artery, the left external
carotid artery and the left vertebral artery. The patient tolerated
the procedure well. There were no acute complications.
FINDINGS: The right common carotid arteriogram demonstrates the right external
carotid artery and its major branches to be widely patent.

The right internal carotid artery at the bulb demonstrates smooth
shallow plaque along the posterior wall. No acute ulcerations are
seen. The vessel is otherwise seen to opacify to the cranial skull
base due to minimal tortuosity in its mid cervical region.

The cavernous and the supraclinoid segments are widely patent.

The right middle cerebral artery and the right anterior cerebral
artery opacify into the capillary and the venous phases.

The delayed capillary phase and the early venous phase demonstrates
a wedge-shaped area of numerous venules which then converge to a
large prominent smooth vein which drains into the junction of the
anterior and the middle one thirds of the superior sagittal sinus.
This is most consistent with a developmental venous anomaly.

The right vertebral artery origin is normal. The vessel is seen to
opacify normally to the cranial skull base. Normal opacification is
seen of the right vertebrobasilar junction and the right posterior
inferior cerebellar artery. The basilar artery, the posterior
cerebral artery, superior cerebellar arteries and the
anterior-inferior cerebellar arteries opacify normally into the
capillary and the venous phases. Non-opacified blood is seen in the
basilar artery from the contralateral vertebral artery.

A hypoplastic left transverse sinus is noted on a developmental
basis.

The left common carotid arteriogram demonstrates the left external
carotid artery and its major branches to be widely patent.

The left internal carotid artery at the bulb to the cranial skull
base opacifies normally. The petrous, cavernous and the supraclinoid
segments are widely patent.

Left posterior communicating artery is seen opacifying the left
posterior cerebral artery distribution.

The left middle cerebral artery and the left anterior cerebral
artery opacify normally into the capillary and the venous phases.

A selective left external carotid arteriogram demonstrates
asymmetric prominence of the left lingual artery, the left facial
artery, and also transverse facial branches from the internal
maxillary artery. There is no evidence of early venous opacification
of any of these branches mentioned. Contrast blush is noted in the
region of the superior meatus of the nasal cavity which may
represent inflammatory reaction.

The left vertebral artery origin is normal.

The vessel is seen to opacify to the cranial skull base.
Opacification of the left posterior inferior cerebellar artery and
the left vertebrobasilar junction is noted.

The basilar artery, the posterior cerebral arteries superior
cerebellar arteries and the anterior-inferior cerebellar arteries
opacify into the capillary and the venous phases.
IMPRESSION: Asymmetric prominence of the left lingual artery, left facial
artery, and left internal maxillary artery transverse facial
branches. However no evidence of arteriovenous shunting is noted in
any of the above distributions.

Moderate-sized developmental venous anomaly of the right mid frontal
subcortical area as described above.

## 2017-10-09 ENCOUNTER — Ambulatory Visit (AMBULATORY_SURGERY_CENTER): Payer: Self-pay | Admitting: *Deleted

## 2017-10-09 ENCOUNTER — Other Ambulatory Visit: Payer: Self-pay

## 2017-10-09 VITALS — Ht 72.0 in | Wt 228.6 lb

## 2017-10-09 DIAGNOSIS — Z86010 Personal history of colonic polyps: Secondary | ICD-10-CM

## 2017-10-09 MED ORDER — NA SULFATE-K SULFATE-MG SULF 17.5-3.13-1.6 GM/177ML PO SOLN: 1.0000 [IU] | Freq: Once | ORAL | 0 refills | Status: AC

## 2017-10-09 NOTE — Progress Notes (Signed)
No egg or soy allergy known to patient  No issues with past sedation with any surgeries  or procedures, no intubation problems  No diet pills per patient No home 02 use per patient  No blood thinners per patient  Pt denies issues with constipation  No A fib or A flutter  EMMI video sent to pt's e mail pt declined   

## 2017-10-15 ENCOUNTER — Encounter: Payer: Self-pay | Admitting: Internal Medicine

## 2017-10-23 ENCOUNTER — Other Ambulatory Visit: Payer: Self-pay

## 2017-10-23 ENCOUNTER — Encounter: Payer: Self-pay | Admitting: Internal Medicine

## 2017-10-23 ENCOUNTER — Ambulatory Visit (AMBULATORY_SURGERY_CENTER): Payer: Managed Care, Other (non HMO) | Admitting: Internal Medicine

## 2017-10-23 VITALS — BP 111/69 | HR 54 | Temp 97.8°F | Resp 13 | Ht 72.0 in | Wt 228.0 lb

## 2017-10-23 DIAGNOSIS — D125 Benign neoplasm of sigmoid colon: Secondary | ICD-10-CM | POA: Diagnosis not present

## 2017-10-23 DIAGNOSIS — Z86010 Personal history of colonic polyps: Secondary | ICD-10-CM

## 2017-10-23 DIAGNOSIS — K635 Polyp of colon: Secondary | ICD-10-CM

## 2017-10-23 MED ORDER — SODIUM CHLORIDE 0.9 % IV SOLN: 500.0000 mL | INTRAVENOUS | Status: DC

## 2017-10-23 NOTE — Op Note (Signed)
[place] Patient Name: Nathan Ballard Procedure Date: 10/23/2017 9:21 AM MRN: [id number] Endoscopist: Jerene Bears , MD Age: 56 Referring MD:  Date of Birth: 1960-12-08 Gender: Male Account #: [id number] Procedure:                Colonoscopy Indications:              Surveillance: Personal history of adenomatous                            polyps on last colonoscopy 5 years ago Medicines:                Monitored Anesthesia Care Procedure:                Pre-Anesthesia Assessment:                           - Prior to the procedure, a History and Physical                            was performed, and patient medications and                            allergies were reviewed. The patient's tolerance of                            previous anesthesia was also reviewed. The risks                            and benefits of the procedure and the sedation                            options and risks were discussed with the patient.                            All questions were answered, and informed consent                            was obtained. Prior Anticoagulants: The patient has                            taken no previous anticoagulant or antiplatelet                            agents. ASA Grade Assessment: II - A patient with                            mild systemic disease. After reviewing the risks                            and benefits, the patient was deemed in                            satisfactory condition to undergo the procedure.  After obtaining informed consent, the colonoscope                            was passed under direct vision. Throughout the                            procedure, the patient's blood pressure, pulse, and                            oxygen saturations were monitored continuously. The                            Colonoscope was introduced through the anus and                            advanced to the the cecum,  identified by                            appendiceal orifice and ileocecal valve. The                            colonoscopy was performed without difficulty. The                            patient tolerated the procedure well. The quality                            of the bowel preparation was good. The ileocecal                            valve, appendiceal orifice, and rectum were                            photographed. Scope In: 9:32:10 AM Scope Out: 9:47:00 AM Scope Withdrawal Time: 0 hours 12 minutes 4 seconds  Total Procedure Duration: 0 hours 14 minutes 50 seconds  Findings:                 The digital rectal exam was normal.                           A 5 mm polyp was found in the sigmoid colon. The                            polyp was sessile. The polyp was removed with a                            cold snare. Resection and retrieval were complete.                           Multiple small and large-mouthed diverticula were                            found in the sigmoid colon, descending colon,  ascending colon and cecum.                           Internal hemorrhoids were found during                            retroflexion. The hemorrhoids were small. Complications:            No immediate complications. Estimated Blood Loss:     Estimated blood loss was minimal. Impression:               - One 5 mm polyp in the sigmoid colon, removed with                            a cold snare. Resected and retrieved.                           - Moderate diverticulosis in the sigmoid colon, in                            the descending colon, in the ascending colon and in                            the cecum.                           - Small internal hemorrhoids. Recommendation:           - Patient has a contact number available for                            emergencies. The signs and symptoms of potential                            delayed complications were  discussed with the                            patient. Return to normal activities tomorrow.                            Written discharge instructions were provided to the                            patient.                           - Resume previous diet.                           - Continue present medications.                           - Await pathology results.                           - Repeat colonoscopy is recommended for  surveillance. The colonoscopy date will be                            determined after pathology results from today's                            exam become available for review. Jerene Bears, MD 10/23/2017 9:49:38 AM This report has been signed electronically.

## 2017-10-23 NOTE — Progress Notes (Signed)
Called to room to assist during endoscopic procedure.  Patient ID and intended procedure confirmed with present staff. Received instructions for my participation in the procedure from the performing physician.  

## 2017-10-23 NOTE — Patient Instructions (Signed)
YOU HAD AN ENDOSCOPIC PROCEDURE TODAY AT THE [facility]:   Refer to the procedure report that was given to you for any specific questions about what was found during the examination.  If the procedure report does not answer your questions, please call your gastroenterologist to clarify.  If you requested that your care partner not be given the details of your procedure findings, then the procedure report has been included in a sealed envelope for you to review at your convenience later.  YOU SHOULD EXPECT: Some feelings of bloating in the abdomen. Passage of more gas than usual.  Walking can help get rid of the air that was put into your GI tract during the procedure and reduce the bloating. If you had a lower endoscopy (such as a colonoscopy or flexible sigmoidoscopy) you may notice spotting of blood in your stool or on the toilet paper. If you underwent a bowel prep for your procedure, you may not have a normal bowel movement for a few days.  Please Note:  You might notice some irritation and congestion in your nose or some drainage.  This is from the oxygen used during your procedure.  There is no need for concern and it should clear up in a day or so.  SYMPTOMS TO REPORT IMMEDIATELY:   Following lower endoscopy (colonoscopy or flexible sigmoidoscopy):  Excessive amounts of blood in the stool  Significant tenderness or worsening of abdominal pains  Swelling of the abdomen that is new, acute  Fever of 100F or higher  For urgent or emergent issues, a gastroenterologist can be reached at any hour by calling [phone].   DIET:  We do recommend a small meal at first, but then you may proceed to your regular diet.  Drink plenty of fluids but you should avoid alcoholic beverages for 24 hours.  ACTIVITY:  You should plan to take it easy for the rest of today and you should NOT DRIVE or use heavy machinery until tomorrow (because of the sedation medicines used during the test).     FOLLOW UP: Our staff will call the number listed on your records the next business day following your procedure to check on you and address any questions or concerns that you may have regarding the information given to you following your procedure. If we do not reach you, we will leave a message.  However, if you are feeling well and you are not experiencing any problems, there is no need to return our call.  We will assume that you have returned to your regular daily activities without incident.  If any biopsies were taken you will be contacted by phone or by letter within the next 1-3 weeks.  Please call us at [phone] if you have not heard about the biopsies in 3 weeks.    SIGNATURES/CONFIDENTIALITY: You and/or your care partner have signed paperwork which will be entered into your electronic medical record.  These signatures attest to the fact that that the information above on your After Visit Summary has been reviewed and is understood.  Full responsibility of the confidentiality of this discharge information lies with you and/or your care-partner  Polyp, diverticulosis, and hemorrhoid information given.. 

## 2017-10-23 NOTE — Progress Notes (Signed)
Pt's states no medical or surgical changes since previsit or office visit. 

## 2017-10-23 NOTE — Progress Notes (Signed)
A and O x3. Report to RN. Tolerated MAC anesthesia well.

## 2017-10-26 ENCOUNTER — Telehealth: Payer: Self-pay | Admitting: *Deleted

## 2017-10-26 NOTE — Telephone Encounter (Signed)
Message left

## 2017-10-28 ENCOUNTER — Encounter: Payer: Self-pay | Admitting: Internal Medicine

## 2017-10-28 ENCOUNTER — Telehealth (INDEPENDENT_AMBULATORY_CARE_PROVIDER_SITE_OTHER): Payer: Self-pay | Admitting: Orthopaedic Surgery

## 2017-10-28 NOTE — Telephone Encounter (Signed)
Patient called saying that he pulled a muscle working out the other day and was wanting to see Dr. Erlinda Hong or speak with you about what he could do to help with the pain. CB # [id number]

## 2017-12-22 ENCOUNTER — Encounter (INDEPENDENT_AMBULATORY_CARE_PROVIDER_SITE_OTHER): Payer: Self-pay | Admitting: Orthopaedic Surgery

## 2017-12-22 ENCOUNTER — Ambulatory Visit (INDEPENDENT_AMBULATORY_CARE_PROVIDER_SITE_OTHER): Payer: Managed Care, Other (non HMO) | Admitting: Orthopaedic Surgery

## 2017-12-22 DIAGNOSIS — M546 Pain in thoracic spine: Secondary | ICD-10-CM

## 2017-12-22 MED ORDER — TIZANIDINE HCL 4 MG PO TABS: 4.0000 mg | ORAL_TABLET | Freq: Four times a day (QID) | ORAL | 2 refills | Status: DC | PRN

## 2017-12-22 NOTE — Progress Notes (Signed)
Office Visit Note   Patient: Nathan Ballard           Date of Birth: 07-Mar-1961           MRN: [id number] Visit Date: 12/22/2017              Requested by: Jani Gravel, [address] [place], [place] [address] PCP: Jani Gravel, MD   Assessment & Plan: Visit Diagnoses:  1. Trigger point of thoracic region     Plan: Impression is 57 year old gentleman with questionable thoracic trigger point and known left thumb CMC arthritis.  I recommend physical therapy with modalities mainly for the trigger point.  He will continue to treat the thumb arthritis symptomatically.  Follow-up as needed.  Follow-Up Instructions: Return if symptoms worsen or fail to improve.   Orders:  No orders of the defined types were placed in this encounter.  Meds ordered this encounter  Medications  . tiZANidine (ZANAFLEX) 4 MG tablet    Sig: Take 1 tablet (4 mg total) by mouth every 6 (six) hours as needed for muscle spasms.    Dispense:  30 tablet    Refill:  2      Procedures: No procedures performed   Clinical Data: No additional findings.   Subjective: Chief Complaint  Patient presents with  . Left Thumb - Pain    Patient is a 57 year old gentleman who I have taken care of in the past who comes in with a new problem of feeling and not over his thoracic spine.  He feels like there is a prominence when he is laying down on the floor.  He endorses discomfort with exercising.  He denies any constitutional symptoms.  He is also complaining of left thumb pain.  He does have known CMC arthritis.  He currently wears a brace as needed and previous injections have helped.    Review of Systems  Constitutional: Negative.   All other systems reviewed and are negative.    Objective: Vital Signs: There were no vitals taken for this visit.  Physical Exam  Constitutional: He is oriented to person, place, and time. He appears well-developed and well-nourished.  Pulmonary/Chest:  Effort normal.  Abdominal: Soft.  Neurological: He is alert and oriented to person, place, and time.  Skin: Skin is warm.  Psychiatric: He has a normal mood and affect. His behavior is normal. Judgment and thought content normal.  Nursing note and vitals reviewed.   Ortho Exam Thoracic spine exam does not reveal any obvious palpable masses or deformities.  He has normal scapular motion and normal shoulder motion.  He is more tender over the spinous processes.  Left hand exam shows positive grind test of the left thumb. Specialty Comments:  No specialty comments available.  Imaging: No results found.   PMFS History: Patient Active Problem List   Diagnosis Date Noted  . Chronic pain of left knee 10/21/2016  . Abnormal finding on MRI of brain   . Arteriovenous malformation of precerebral vessels   . Syncope 03/30/2016  . 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 Hypomagnesemia 03/02/2016  . Acute respiratory failure with hypoxia ([facility]) 03/02/2016  . Seizure ([facility]) 02/29/2016  . Abnormal liver function 02/29/2016  . Spells 02/29/2016  . Depression 02/29/2016  . Hypertension   . Hyperlipidemia   . Anxiety   . Arthritis of carpometacarpal (CMC) joint of left thumb   . Essential hypertension   .  Leukocytosis   . Seizures ([facility])    Past Medical History:  Diagnosis Date  . Anxiety   . Depression   . Diverticulosis of colon   . History of adenomatous polyp of colon    09-03-2012 TUBUAL ADENOMA AND HYPERPLASTIC  . History of basal cell carcinoma excision    08-21-2014 SCALP;  02-15-2015 LEFT LATERAL NECK  . History of idiopathic seizure [place]UROLOGIST-  DR ADAM JAFFE ([place])-- PER NOTE SPELLS OF SEIZURE-LIKE ACTITIVY W/ HYPOXIA AND HYPERCAPNIA ETOLOGY UNCLEAR, SEMIOLOGY STILL NOT TYPICAL FOR SEIZURE/ CENTRAL APNEIC SPELL VERSUS CYMBALTA SIDE EFFECT   per pt last spell 06/ 2017 none since / on 02-28-2016 ED VISIT--  PROBABLE  SEIZURE W/ ACUTE RESPIRATORY FAILURE W/ HYPOXIA REQUIRED INTUBATION (CARDIAC AND PULMONARY CAUSES RULED OUT)  . Hyperlipidemia   . Hypertension   . OA (osteoarthritis)   . Subcutaneous cyst    LOWER ABDOMIN AND RIGHT FOREARM  . Venous angioma of brain [facility])    incidental finding on MRI 12/ 2008  posterior right frontal lobe angioma (asymptomatic) (MRI done for disorientation)  . Wears contact lenses     Family History  Problem Relation Age of Onset  . Diabetes Father   . Heart disease Father   . Hypertension Father   . Alzheimer's disease Mother   . Alzheimer's disease Maternal Grandmother   . Colon cancer Neg Hx   . Stomach cancer Neg Hx   . Colon polyps Neg Hx   . Esophageal cancer Neg Hx   . Rectal cancer Neg Hx     Past Surgical History:  Procedure Laterality Date  . ANTERIOR CRUCIATE LIGAMENT REPAIR Left 1989; 2007  . COLONOSCOPY    . COLONOSCOPY WITH PROPOFOL  09/03/2012  . KNEE ARTHROSCOPY WITH MEDIAL MENISECTOMY Right 09/19/2015   Procedure: RIGHT KNEE ARTHROSCOPY WITH PARTIAL MEDIAL MENISCECTOMY;  Surgeon: Leandrew Koyanagi, MD;  Location: [facility];  Service: Orthopedics;  Laterality: Right;  . LIPOMA EXCISION Right 05/01/2017   Procedure: EXCISION OF CYST FROM RIGHT FOREARM AND LOWER ABDOMEN;  Surgeon: Clovis Riley, MD;  Location: [facility];  Service: General;  Laterality: Right;  site= right arm and abdomen  . POLYPECTOMY    . REMOVAL GREAT TOE NAIL Right 1993  . SHOULDER SURGERY Right 1999  . TRANSTHORACIC ECHOCARDIOGRAM  03/01/2016   GRADE 1 DIASTOLIC DYSFUNCTION/  EF 55-60%  . WRIST GANGLION EXCISION Right 1998   Social History   Occupational History  . Not on file  Tobacco Use  . Smoking status: Never Smoker  . Smokeless tobacco: Never Used  Substance and Sexual Activity  . Alcohol use: Yes    Comment: rarely  . Drug use: No  . Sexual activity: Not on file

## 2018-03-26 ENCOUNTER — Ambulatory Visit (INDEPENDENT_AMBULATORY_CARE_PROVIDER_SITE_OTHER): Payer: Managed Care, Other (non HMO) | Admitting: Orthopaedic Surgery

## 2018-04-02 ENCOUNTER — Ambulatory Visit (INDEPENDENT_AMBULATORY_CARE_PROVIDER_SITE_OTHER): Payer: Managed Care, Other (non HMO) | Admitting: Orthopaedic Surgery

## 2018-04-02 DIAGNOSIS — M79672 Pain in left foot: Secondary | ICD-10-CM

## 2018-04-02 MED ORDER — CALCIUM CARBONATE-VITAMIN D 500-200 MG-UNIT PO TABS: 1.0000 | ORAL_TABLET | Freq: Three times a day (TID) | ORAL | 12 refills | Status: DC

## 2018-04-02 MED ORDER — ZINC SULFATE 220 (50 ZN) MG PO CAPS
220.0000 mg | ORAL_CAPSULE | Freq: Every day | ORAL | 0 refills | Status: DC
Start: 2018-04-02 — End: 2021-11-15

## 2018-04-02 NOTE — Progress Notes (Signed)
Office Visit Note   Patient: Nathan Ballard           Date of Birth: 1961-06-21           MRN: [id number] Visit Date: 04/02/2018              Requested by: Jani Gravel, MD [address] [place], [place] [address] PCP: Jani Gravel, MD   Assessment & Plan: Visit Diagnoses:  1. Pain in left foot     Plan: Impression is left fifth metatarsal stress fracture versus stress reaction.  X-rays are negative.  I recommend protected weightbearing with Cam boot for 4 weeks.  Prescription for Os-Cal and zinc.  Recommend nonimpact exercises.  Follow-up in 4 weeks with three-view x-rays of the left foot if he is still symptomatic.  Follow-Up Instructions: Return in about 1 month (around 04/30/2018).   Orders:  No orders of the defined types were placed in this encounter.  Meds ordered this encounter  Medications  . calcium-vitamin D (OSCAL WITH D) 500-200 MG-UNIT tablet    Sig: Take 1 tablet by mouth 3 (three) times daily.    Dispense:  90 tablet    Refill:  12  . zinc sulfate 220 (50 Zn) MG capsule    Sig: Take 1 capsule (220 mg total) by mouth daily.    Dispense:  42 capsule    Refill:  0      Procedures: No procedures performed   Clinical Data: No additional findings.   Subjective: Chief Complaint  Patient presents with  . Left Foot - Pain    Physical exam very pleasant 57 year old gentleman who I am seeing for many years comes in with a new problem of left foot pain directly over the base the fifth metatarsal for approximately 3 weeks.  Denies any injuries.  He states that he has been doing a lot of walking and exercising.  He had x-rays recently at the urgent care which was essentially negative.   Review of Systems  Constitutional: Negative.   All other systems reviewed and are negative.    Objective: Vital Signs: There were no vitals taken for this visit.  Physical Exam  Constitutional: He is oriented to person, place, and time. He appears  well-developed and well-nourished.  Pulmonary/Chest: Effort normal.  Abdominal: Soft.  Neurological: He is alert and oriented to person, place, and time.  Skin: Skin is warm.  Psychiatric: He has a normal mood and affect. His behavior is normal. Judgment and thought content normal.  Nursing note and vitals reviewed.   Ortho Exam Left foot exam shows significant tenderness of the base of the fifth metatarsal.  Peroneus brevis is nontender.  Metatarsal shaft is nontender. Specialty Comments:  No specialty comments available.  Imaging: No results found.   PMFS History: Patient Active Problem List   Diagnosis Date Noted  . Chronic pain of left knee 10/21/2016  . Abnormal finding on MRI of brain   . Arteriovenous malformation of precerebral vessels   . Syncope 03/30/2016  . 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 Hypomagnesemia 03/02/2016  . Acute respiratory failure with hypoxia ([facility]) 03/02/2016  . Seizure ([facility]) 02/29/2016  . Abnormal liver function 02/29/2016  . Spells 02/29/2016  . Depression 02/29/2016  . Hypertension   . Hyperlipidemia   . Anxiety   . Arthritis of carpometacarpal (CMC) joint of left thumb   . Essential hypertension   . Leukocytosis   .  Seizures ([facility])    Past Medical History:  Diagnosis Date  . Anxiety   . Depression   . Diverticulosis of colon   . History of adenomatous polyp of colon    09-03-2012 TUBUAL ADENOMA AND HYPERPLASTIC  . History of basal cell carcinoma excision    08-21-2014 SCALP;  02-15-2015 LEFT LATERAL NECK  . History of idiopathic seizure [place] NEUROLOGIST-  DR ADAM JAFFE ([place])-- PER NOTE SPELLS OF SEIZURE-LIKE ACTITIVY W/ HYPOXIA AND HYPERCAPNIA ETOLOGY UNCLEAR, SEMIOLOGY STILL NOT TYPICAL FOR SEIZURE/ CENTRAL APNEIC SPELL VERSUS CYMBALTA SIDE EFFECT   per pt last spell 06/ 2017 none since / on 02-28-2016 ED VISIT--  PROBABLE SEIZURE W/ ACUTE RESPIRATORY FAILURE W/ HYPOXIA  REQUIRED INTUBATION (CARDIAC AND PULMONARY CAUSES RULED OUT)  . Hyperlipidemia   . Hypertension   . OA (osteoarthritis)   . Subcutaneous cyst    LOWER ABDOMIN AND RIGHT FOREARM  . Venous angioma of brain [facility])    incidental finding on MRI 12/ 2008  posterior right frontal lobe angioma (asymptomatic) (MRI done for disorientation)  . Wears contact lenses     Family History  Problem Relation Age of Onset  . Diabetes Father   . Heart disease Father   . Hypertension Father   . Alzheimer's disease Mother   . Alzheimer's disease Maternal Grandmother   . Colon cancer Neg Hx   . Stomach cancer Neg Hx   . Colon polyps Neg Hx   . Esophageal cancer Neg Hx   . Rectal cancer Neg Hx     Past Surgical History:  Procedure Laterality Date  . ANTERIOR CRUCIATE LIGAMENT REPAIR Left 1989; 2007  . COLONOSCOPY    . COLONOSCOPY WITH PROPOFOL  09/03/2012  . KNEE ARTHROSCOPY WITH MEDIAL MENISECTOMY Right 09/19/2015   Procedure: RIGHT KNEE ARTHROSCOPY WITH PARTIAL MEDIAL MENISCECTOMY;  Surgeon: Leandrew Koyanagi, MD;  Location: [facility];  Service: Orthopedics;  Laterality: Right;  . LIPOMA EXCISION Right 05/01/2017   Procedure: EXCISION OF CYST FROM RIGHT FOREARM AND LOWER ABDOMEN;  Surgeon: Clovis Riley, MD;  Location: [facility];  Service: General;  Laterality: Right;  site= right arm and abdomen  . POLYPECTOMY    . REMOVAL GREAT TOE NAIL Right 1993  . SHOULDER SURGERY Right 1999  . TRANSTHORACIC ECHOCARDIOGRAM  03/01/2016   GRADE 1 DIASTOLIC DYSFUNCTION/  EF 55-60%  . WRIST GANGLION EXCISION Right 1998   Social History   Occupational History  . Not on file  Tobacco Use  . Smoking status: Never Smoker  . Smokeless tobacco: Never Used  Substance and Sexual Activity  . Alcohol use: Yes    Comment: rarely  . Drug use: No  . Sexual activity: Not on file

## 2018-05-04 ENCOUNTER — Ambulatory Visit (INDEPENDENT_AMBULATORY_CARE_PROVIDER_SITE_OTHER): Payer: Managed Care, Other (non HMO) | Admitting: Orthopaedic Surgery

## 2019-02-24 DIAGNOSIS — N138 Other obstructive and reflux uropathy: Secondary | ICD-10-CM

## 2019-02-24 DIAGNOSIS — R351 Nocturia: Secondary | ICD-10-CM | POA: Insufficient documentation

## 2019-02-24 HISTORY — DX: Other obstructive and reflux uropathy: N13.8

## 2019-09-22 ENCOUNTER — Ambulatory Visit: Payer: Self-pay

## 2019-09-22 ENCOUNTER — Ambulatory Visit: Payer: Managed Care, Other (non HMO) | Admitting: Orthopaedic Surgery

## 2019-09-22 ENCOUNTER — Ambulatory Visit (INDEPENDENT_AMBULATORY_CARE_PROVIDER_SITE_OTHER): Payer: Managed Care, Other (non HMO)

## 2019-09-22 ENCOUNTER — Encounter: Payer: Self-pay | Admitting: Orthopaedic Surgery

## 2019-09-22 ENCOUNTER — Other Ambulatory Visit: Payer: Self-pay

## 2019-09-22 DIAGNOSIS — M25511 Pain in right shoulder: Secondary | ICD-10-CM

## 2019-09-22 DIAGNOSIS — G8929 Other chronic pain: Secondary | ICD-10-CM | POA: Diagnosis not present

## 2019-09-22 DIAGNOSIS — M542 Cervicalgia: Secondary | ICD-10-CM

## 2019-09-22 NOTE — Progress Notes (Signed)
Subjective: Patient is here for ultrasound-guided intra-articular right glenohumeral injection.  Pain anterior and posterior.  Objective:  Decreased ROM with overhead and behind the back reach.  Procedure: Ultrasound-guided right glenohumeral injection: After sterile prep with Betadine, injected 8 cc 1% lidocaine without epinephrine and 40 mg methylprednisolone using a 22-gauge spinal needle, passing the needle through approach into the glenohumeral joint.  Injectate seen filling joint capsule.  Return as directed.

## 2019-09-22 NOTE — Progress Notes (Signed)
Office Visit Note   Patient: Nathan Ballard           Date of Birth: 10-08-1961           MRN: [id number] Visit Date: 09/22/2019              Requested by: Jani Gravel, [address],  [place] [address] PCP: Jani Gravel, MD   Assessment & Plan: Visit Diagnoses:  1. Chronic right shoulder pain     Plan: Impression is right shoulder glenohumeral osteoarthritis and cervicalgia.  In regards to the right shoulder, we will refer the patient to Dr. Junius Roads for an ultrasound-guided cortisone injection of the glenohumeral joint.  He is encouraged to back off from weightlifting.  At the end of the day he will need a total shoulder replacement.  In regards to his neck, we will refer him to physical therapy.  He will follow-up with [place] as needed.  Follow-Up Instructions: No follow-ups on file.   Orders:  Orders Placed This Encounter  Procedures  . XR Shoulder Right  . XR Cervical Spine 2 or 3 views   No orders of the defined types were placed in this encounter.     Procedures: No procedures performed   Clinical Data: No additional findings.   Subjective: Chief Complaint  Patient presents with  . Right Shoulder - Pain  . Neck - Pain    HPI patient is a pleasant 58 year old gentleman who presents to our clinic today with right shoulder pain.  He has been dealing with this for quite some time.  The pain he has is primarily to the entire shoulder and radiates down into the deltoid.  He notes increased pain with overhead activities as well as with abducting his shoulder.  He notes occasional tingling to the thumb.  He did have what sounds like some sort of stabilization surgery to the right shoulder for instability by Dr. Shellia Carwin about 25 years ago.  No previous cortisone injection.  Review of Systems as detailed in HPI.  All others reviewed and are negative.   Objective: Vital Signs: There were no vitals taken for this visit.  Physical Exam  well-developed well-nourished gentleman in no acute distress.  Alert and oriented x3.  Ortho Exam examination of his right shoulder reveals near full active range of motion although this is with pain at the extremes of forward flexion and internal rotation.  Positive empty can.  No focal weakness.  He is neurovascularly intact distally.  Cervical spine exam is benign.  Specialty Comments:  No specialty comments available.  Imaging: Xr Cervical Spine 2 Or 3 Views  Result Date: 09/22/2019 X-rays demonstrate abnormal straightening of the cervical spine with disc space narrowing C5-6  Xr Shoulder Right  Result Date: 09/22/2019 X-rays demonstrate significant glenohumeral space narrowing with periarticular spurring    PMFS History: Patient Active Problem List   Diagnosis Date Noted  . Chronic pain of left knee 10/21/2016  . Abnormal finding on MRI of brain   . Arteriovenous malformation of precerebral vessels   . Syncope 03/30/2016  . 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) 03/06/2016  . Hypomagnesemia 03/02/2016  . Acute respiratory failure with hypoxia ([facility]) 03/02/2016  . Seizure ([facility]) 02/29/2016  . Abnormal liver function 02/29/2016  . Spells 02/29/2016  . Depression 02/29/2016  . Hypertension   . Hyperlipidemia   . Anxiety   . Arthritis of carpometacarpal (CMC) joint of left thumb   .  Essential hypertension   . Leukocytosis   . Seizures ([facility])    Past Medical History:  Diagnosis Date  . Anxiety   . Depression   . Diverticulosis of colon   . History of adenomatous polyp of colon    09-03-2012 TUBUAL ADENOMA AND HYPERPLASTIC  . History of basal cell carcinoma excision    08-21-2014 SCALP;  02-15-2015 LEFT LATERAL NECK  . History of idiopathic seizure [facility] NEUROLOGIST-  DR ADAM JAFFE ([place])-- PER NOTE SPELLS OF SEIZURE-LIKE ACTITIVY W/ HYPOXIA AND HYPERCAPNIA ETOLOGY UNCLEAR, SEMIOLOGY STILL NOT TYPICAL FOR SEIZURE/ CENTRAL APNEIC  SPELL VERSUS CYMBALTA SIDE EFFECT   per pt last spell 06/ 2017 none since / on 02-28-2016 ED VISIT--  PROBABLE SEIZURE W/ ACUTE RESPIRATORY FAILURE W/ HYPOXIA REQUIRED INTUBATION (CARDIAC AND PULMONARY CAUSES RULED OUT)  . Hyperlipidemia   . Hypertension   . OA (osteoarthritis)   . Subcutaneous cyst    LOWER ABDOMIN AND RIGHT FOREARM  . Venous angioma of brain [facility])    incidental finding on MRI 12/ 2008  posterior right frontal lobe angioma (asymptomatic) (MRI done for disorientation)  . Wears contact lenses     Family History  Problem Relation Age of Onset  . Diabetes Father   . Heart disease Father   . Hypertension Father   . Alzheimer's disease Mother   . Alzheimer's disease Maternal Grandmother   . Colon cancer Neg Hx   . Stomach cancer Neg Hx   . Colon polyps Neg Hx   . Esophageal cancer Neg Hx   . Rectal cancer Neg Hx     Past Surgical History:  Procedure Laterality Date  . ANTERIOR CRUCIATE LIGAMENT REPAIR Left 1989; 2007  . COLONOSCOPY    . COLONOSCOPY WITH PROPOFOL  09/03/2012  . KNEE ARTHROSCOPY WITH MEDIAL MENISECTOMY Right 09/19/2015   Procedure: RIGHT KNEE ARTHROSCOPY WITH PARTIAL MEDIAL MENISCECTOMY;  Surgeon: Leandrew Koyanagi, MD;  Location: [facility];  Service: Orthopedics;  Laterality: Right;  . LIPOMA EXCISION Right 05/01/2017   Procedure: EXCISION OF CYST FROM RIGHT FOREARM AND LOWER ABDOMEN;  Surgeon: Clovis Riley, MD;  Location: [facility];  Service: General;  Laterality: Right;  site= right arm and abdomen  . POLYPECTOMY    . REMOVAL GREAT TOE NAIL Right 1993  . SHOULDER SURGERY Right 1999  . TRANSTHORACIC ECHOCARDIOGRAM  03/01/2016   GRADE 1 DIASTOLIC DYSFUNCTION/  EF 55-60%  . WRIST GANGLION EXCISION Right 1998   Social History   Occupational History  . Not on file  Tobacco Use  . Smoking status: Never Smoker  . Smokeless tobacco: Never Used  Substance and Sexual Activity  . Alcohol use: Yes    Comment: rarely  .  Drug use: No  . Sexual activity: Not on file

## 2020-04-24 DIAGNOSIS — N486 Induration penis plastica: Secondary | ICD-10-CM

## 2020-04-24 HISTORY — DX: Induration penis plastica: N48.6

## 2020-05-25 DIAGNOSIS — J029 Acute pharyngitis, unspecified: Secondary | ICD-10-CM | POA: Diagnosis not present

## 2020-05-25 DIAGNOSIS — J039 Acute tonsillitis, unspecified: Secondary | ICD-10-CM | POA: Diagnosis not present

## 2020-05-25 DIAGNOSIS — J0300 Acute streptococcal tonsillitis, unspecified: Secondary | ICD-10-CM | POA: Diagnosis not present

## 2020-06-10 DIAGNOSIS — Z8616 Personal history of COVID-19: Secondary | ICD-10-CM

## 2020-06-10 HISTORY — DX: Personal history of COVID-19: Z86.16

## 2020-06-19 DIAGNOSIS — R05 Cough: Secondary | ICD-10-CM | POA: Diagnosis not present

## 2020-06-19 DIAGNOSIS — R519 Headache, unspecified: Secondary | ICD-10-CM | POA: Diagnosis not present

## 2020-06-19 DIAGNOSIS — Z20828 Contact with and (suspected) exposure to other viral communicable diseases: Secondary | ICD-10-CM | POA: Diagnosis not present

## 2020-08-14 ENCOUNTER — Encounter: Payer: Self-pay | Admitting: Neurology

## 2020-08-14 DIAGNOSIS — R413 Other amnesia: Secondary | ICD-10-CM | POA: Diagnosis not present

## 2020-08-17 ENCOUNTER — Ambulatory Visit (INDEPENDENT_AMBULATORY_CARE_PROVIDER_SITE_OTHER): Payer: BC Managed Care – PPO | Admitting: Orthopaedic Surgery

## 2020-08-17 ENCOUNTER — Ambulatory Visit: Payer: Self-pay

## 2020-08-17 ENCOUNTER — Encounter: Payer: Self-pay | Admitting: Orthopaedic Surgery

## 2020-08-17 ENCOUNTER — Other Ambulatory Visit: Payer: Self-pay

## 2020-08-17 VITALS — Ht 72.0 in | Wt 207.0 lb

## 2020-08-17 DIAGNOSIS — M25562 Pain in left knee: Secondary | ICD-10-CM

## 2020-08-17 DIAGNOSIS — G8929 Other chronic pain: Secondary | ICD-10-CM | POA: Diagnosis not present

## 2020-08-17 DIAGNOSIS — M79671 Pain in right foot: Secondary | ICD-10-CM | POA: Diagnosis not present

## 2020-08-17 NOTE — Progress Notes (Signed)
Office Visit Note   Patient: Nathan Ballard           Date of Birth: 12/09/1960           MRN: [id number] Visit Date: 08/17/2020              Requested by: Jani Gravel, [address],  [place] [address] PCP: Jani Gravel, MD   Assessment & Plan: Visit Diagnoses:  1. Pain in right foot   2. Chronic pain of left knee     Plan: Impression is right third toe pain and left knee pain.  For the toe not exactly sure what is going on.  The x-rays are negative.  I would like to send him to Dr. Sharol Given for further evaluation.  For the left knee I think there is a questionable lateral meniscal tear.  No joint effusion or mechanical symptoms chest pain.  Patient agreed to undergo cortisone injection today.  He will let us know in a couple weeks if he does not notice any improvement.  Follow-Up Instructions: Return for Follow-up with Dr. Sharol Given please.   Orders:  Orders Placed This Encounter  Procedures   XR Foot Complete Right   XR KNEE 3 VIEW LEFT   No orders of the defined types were placed in this encounter.     Procedures: No procedures performed   Clinical Data: No additional findings.   Subjective: Chief Complaint  Patient presents with   Right Foot - Pain   Left Knee - Pain    Nathan Ballard is a 59 year old gentleman who I have known for several years who comes in for evaluation of right third toe pain for 3 months and left knee pain for 3 weeks.  In terms of the toe he jammed it about 3 months ago and now he has sharp pains when he is walking.  It was initially bruised but this bruising went away.  He denies any swelling.  For the left knee he denies any injuries but he remembers pulling sensation in his lower leg and posterior knee and felt like something became unstable.  He has had 2 prior ACL surgeries.  Denies any swelling.   Review of Systems  Constitutional: Negative.   All other systems reviewed and are negative.    Objective: Vital Signs:  Ht 6' (1.829 m)    Wt 207 lb (93.9 kg)    BMI 28.07 kg/m   Physical Exam Vitals and nursing note reviewed.  Constitutional:      Appearance: He is well-developed.  Pulmonary:     Effort: Pulmonary effort is normal.  Abdominal:     Palpations: Abdomen is soft.  Skin:    General: Skin is warm.  Neurological:     Mental Status: He is alert and oriented to person, place, and time.  Psychiatric:        Behavior: Behavior normal.        Thought Content: Thought content normal.        Judgment: Judgment normal.     Ortho Exam Right third toe shows no malalignments.  No swelling.  No redness or skin discolorations.  No neurovascular compromise.  He is tender palpation of the PIP joint.  He is slightly tender to the plantar aspect of the metatarsal head as well as in the webspace.  Left knee shows fully healed surgical scars.  Collaterals and cruciates are stable.  He has some slight lateral joint line tenderness.  Negative dial  test.  No popliteal masses Specialty Comments:  No specialty comments available.  Imaging: XR Foot Complete Right  Result Date: 08/17/2020 No acute or structural abnormalities.  XR KNEE 3 VIEW LEFT  Result Date: 08/17/2020 Advanced degenerative joint disease of the left knee.  For retained metallic screws from prior ACL surgeries.    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[place]) 03/06/2016   Hypomagnesemia 03/02/2016   Acute respiratory failure with hypoxia ([place]) 03/02/2016   Seizure ([facility]) 02/29/2016   Abnormal liver function 02/29/2016   Spells 02/29/2016   Depression 02/29/2016   Hypertension    Hyperlipidemia    Anxiety    Arthritis of carpometacarpal (CMC) joint of left thumb     Essential hypertension    Leukocytosis    Seizures ([place])    Past Medical History:  Diagnosis Date   Anxiety    Depression    Diverticulosis of colon    History of adenomatous polyp of colon    09-03-2012 TUBUAL ADENOMA AND HYPERPLASTIC   History of basal cell carcinoma excision    08-21-2014 SCALP;  02-15-2015 LEFT LATERAL NECK   History of idiopathic seizure [place] NEUROLOGIST-  DR ADAM JAFFE ()-- PER NOTE SPELLS OF SEIZURE-LIKE ACTITIVY W/ HYPOXIA AND HYPERCAPNIA ETOLOGY UNCLEAR, SEMIOLOGY STILL NOT TYPICAL FOR SEIZURE/ CENTRAL APNEIC SPELL VERSUS CYMBALTA SIDE EFFECT   per pt last spell 06/ 2017 none since / on 02-28-2016 ED VISIT--  PROBABLE SEIZURE W/ ACUTE RESPIRATORY FAILURE W/ HYPOXIA REQUIRED INTUBATION (CARDIAC AND PULMONARY CAUSES RULED OUT)   Hyperlipidemia    Hypertension    OA (osteoarthritis)    Subcutaneous cyst    LOWER ABDOMIN AND RIGHT FOREARM   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[place])    incidental finding on MRI 12/ 2008  posterior right frontal lobe angioma (asymptomatic) (MRI done for disorientation)   Wears contact lenses     Family History  Problem Relation Age of Onset   Diabetes Father    Heart disease Father    Hypertension Father    Alzheimer's disease Mother    Alzheimer's disease Maternal Grandmother    Colon cancer Neg Hx    Stomach cancer Neg Hx    Colon polyps Neg Hx    Esophageal cancer Neg Hx    Rectal cancer Neg Hx     Past Surgical History:  Procedure Laterality Date   ANTERIOR CRUCIATE LIGAMENT REPAIR Left 1989; 2007   COLONOSCOPY     COLONOSCOPY WITH PROPOFOL  09/03/2012   KNEE ARTHROSCOPY WITH MEDIAL MENISECTOMY Right 09/19/2015   Procedure: RIGHT KNEE ARTHROSCOPY WITH PARTIAL MEDIAL MENISCECTOMY;  Surgeon: Leandrew Koyanagi, MD;  Location: [facility];  Service: Orthopedics;  Laterality: Right;   LIPOMA EXCISION Right 05/01/2017   Procedure: EXCISION OF CYST FROM RIGHT FOREARM AND LOWER ABDOMEN;   Surgeon: Clovis Riley, MD;  Location: [facility];  Service: General;  Laterality: Right;  site= right arm and abdomen   POLYPECTOMY     REMOVAL GREAT TOE NAIL Right [place] ECHOCARDIOGRAM  03/01/2016   GRADE 1 DIASTOLIC DYSFUNCTION/  EF 55-60%   WRIST GANGLION EXCISION Right 1998   Social History   Occupational  History   Not on file  Tobacco Use   Smoking status: Never Smoker   Smokeless tobacco: Never Used  Substance and Sexual Activity   Alcohol use: Yes    Comment: rarely   Drug use: No   Sexual activity: Not on file

## 2020-08-20 ENCOUNTER — Ambulatory Visit (INDEPENDENT_AMBULATORY_CARE_PROVIDER_SITE_OTHER): Payer: BC Managed Care – PPO | Admitting: Orthopedic Surgery

## 2020-08-20 ENCOUNTER — Encounter: Payer: Self-pay | Admitting: Orthopedic Surgery

## 2020-08-20 VITALS — Ht 72.0 in | Wt 207.0 lb

## 2020-08-20 DIAGNOSIS — M6701 Short Achilles tendon (acquired), right ankle: Secondary | ICD-10-CM

## 2020-08-20 DIAGNOSIS — M7741 Metatarsalgia, right foot: Secondary | ICD-10-CM | POA: Diagnosis not present

## 2020-08-20 NOTE — Progress Notes (Signed)
Office Visit Note   Patient: Nathan Ballard           Date of Birth: 09-Apr-1961           MRN: [id number] Visit Date: 08/20/2020              Requested by: Jani Gravel, [address],  [place] [address] PCP: Jani Gravel, MD  Chief Complaint  Patient presents with  . Right Foot - Pain    Third toe      HPI: Patient is a 59 year old gentleman who was seen in consultation for Dr.Xu for evaluation of second metatarsal head pain patient states he can only wear certain shoes.  States he has a pain on the ball of the foot just behind the toe only has pain with weightbearing.  Assessment & Plan: Visit Diagnoses:  1. Achilles tendon contracture, right   2. Metatarsalgia, right foot     Plan: Patient was given instructions and demonstrated Achilles stretching recommended a stiff soled sneaker and he could place a carbon plate under the orthotic if the sneakers not stiff enough.  Discussed that if he fails conservative treatment surgical intervention would be to proceed with a gastrocnemius recession and a Weil osteotomy for the second and third metatarsals.  Follow-Up Instructions: Return if symptoms worsen or fail to improve.   Ortho Exam  Patient is alert, oriented, no adenopathy, well-dressed, normal affect, normal respiratory effort. Examination patient has a good dorsalis pedis pulse with his knee extended he has dorsiflexion of the ankle 10 degrees short of neutral patient is previously partial amputation of the tuft of the right great toe secondary to a ingrown toenail.  Patient has pain to palpation beneath the second metatarsal head there is flexible clawing of the lesser toes.  Review of the radiographs shows a long second metatarsal with flattening of the joint of the metatarsal head.  There is also a long third metatarsal that is not as prominent.  Imaging: No results found. No images are attached to the encounter.  Labs: Lab Results    Component Value Date   ESRSEDRATE 2 03/01/2016   CRP 0.5 03/01/2016     Lab Results  Component Value Date   ALBUMIN 3.7 03/31/2016   ALBUMIN 4.6 03/28/2016   ALBUMIN 3.1 (L) 03/04/2016    Lab Results  Component Value Date   MG 1.7 03/22/2016   MG 1.9 03/03/2016   MG 1.8 03/02/2016   Lab Results  Component Value Date   VD25OH 36.21 06/24/2016    No results found for: PREALBUMIN CBC EXTENDED Latest Ref Rng & Units 05/01/2017 03/31/2016 03/30/2016  WBC 4.0 - 10.5 K/uL - 5.1 4.5  RBC 4.22 - 5.81 MIL/uL - 4.29 3.97(L)  HGB 13.0 - 17.0 g/dL 13.3 13.3 12.3(L)  HCT 39 - 52 % 39.0 40.4 37.7(L)  PLT 150 - 400 K/uL - 192 162  NEUTROABS 1.7 - 7.7 K/uL - - 2.5  LYMPHSABS 0.7 - 4.0 K/uL - - 1.3     Body mass index is 28.07 kg/m.  Orders:  No orders of the defined types were placed in this encounter.  No orders of the defined types were placed in this encounter.    Procedures: No procedures performed  Clinical Data: No additional findings.  ROS:  All other systems negative, except as noted in the HPI. Review of Systems  Objective: Vital Signs: Ht 6' (1.829 m)   Wt 207 lb (93.9 kg)  BMI 28.07 kg/m   Specialty Comments:  No specialty comments available.  PMFS History: Patient Active Problem List   Diagnosis Date Noted  . Chronic pain of left knee 10/21/2016  . Abnormal finding on MRI of brain   . Arteriovenous malformation of precerebral vessels   . Syncope 03/30/2016  . Complex partial epilepsy ([facility]) 03/06/2016  . 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[facility]) 03/06/2016  . Hypomagnesemia 03/02/2016  . Acute respiratory failure with hypoxia ([facility]) 03/02/2016  . Seizure ([facility]) 02/29/2016  . Abnormal liver function 02/29/2016  . Spells 02/29/2016  . Depression 02/29/2016  . Hypertension   . Hyperlipidemia   . Anxiety   . Arthritis of carpometacarpal (CMC) joint of left thumb   . Essential hypertension   . Leukocytosis   . Seizures ([facility])    Past Medical History:   Diagnosis Date  . Anxiety   . Depression   . Diverticulosis of colon   . History of adenomatous polyp of colon    09-03-2012 TUBUAL ADENOMA AND HYPERPLASTIC  . History of basal cell carcinoma excision    08-21-2014 SCALP;  02-15-2015 LEFT LATERAL NECK  . History of idiopathic seizure [place] NEUROLOGIST-  DR ADAM JAFFE ([place])-- PER NOTE SPELLS OF SEIZURE-LIKE ACTITIVY W/ HYPOXIA AND HYPERCAPNIA ETOLOGY UNCLEAR, SEMIOLOGY STILL NOT TYPICAL FOR SEIZURE/ CENTRAL APNEIC SPELL VERSUS CYMBALTA SIDE EFFECT   per pt last spell 06/ 2017 none since / on 02-28-2016 ED VISIT--  PROBABLE SEIZURE W/ ACUTE RESPIRATORY FAILURE W/ HYPOXIA REQUIRED INTUBATION (CARDIAC AND PULMONARY CAUSES RULED OUT)  . Hyperlipidemia   . Hypertension   . OA (osteoarthritis)   . Subcutaneous cyst    LOWER ABDOMIN AND RIGHT FOREARM  . Venous angioma of brain [facility])    incidental finding on MRI 12/ 2008  posterior right frontal lobe angioma (asymptomatic) (MRI done for disorientation)  . Wears contact lenses     Family History  Problem Relation Age of Onset  . Diabetes Father   . Heart disease Father   . Hypertension Father   . Alzheimer's disease Mother   . Alzheimer's disease Maternal Grandmother   . Colon cancer Neg Hx   . Stomach cancer Neg Hx   . Colon polyps Neg Hx   . Esophageal cancer Neg Hx   . Rectal cancer Neg Hx     Past Surgical History:  Procedure Laterality Date  . ANTERIOR CRUCIATE LIGAMENT REPAIR Left 1989; 2007  . COLONOSCOPY    . COLONOSCOPY WITH PROPOFOL  09/03/2012  . KNEE ARTHROSCOPY WITH MEDIAL MENISECTOMY Right 09/19/2015   Procedure: RIGHT KNEE ARTHROSCOPY WITH PARTIAL MEDIAL MENISCECTOMY;  Surgeon: Leandrew Koyanagi, MD;  Location: [facility];  Service: Orthopedics;  Laterality: Right;  . LIPOMA EXCISION Right 05/01/2017   Procedure: EXCISION OF CYST FROM RIGHT FOREARM AND LOWER ABDOMEN;  Surgeon: Clovis Riley, MD;  Location: [facility];  Service: General;   Laterality: Right;  site= right arm and abdomen  . POLYPECTOMY    . REMOVAL GREAT TOE NAIL Right 1993  . SHOULDER SURGERY Right 1999  . TRANSTHORACIC ECHOCARDIOGRAM  03/01/2016   GRADE 1 DIASTOLIC DYSFUNCTION/  EF 55-60%  . WRIST GANGLION EXCISION Right 1998   Social History   Occupational History  . Not on file  Tobacco Use  . Smoking status: Never Smoker  . Smokeless tobacco: Never Used  Substance and Sexual Activity  . Alcohol use: Yes    Comment: rarely  . Drug use: No  . Sexual  activity: Not on file

## 2020-08-27 DIAGNOSIS — E290 Testicular hyperfunction: Secondary | ICD-10-CM | POA: Diagnosis not present

## 2020-08-27 DIAGNOSIS — M109 Gout, unspecified: Secondary | ICD-10-CM | POA: Diagnosis not present

## 2020-08-27 DIAGNOSIS — I10 Essential (primary) hypertension: Secondary | ICD-10-CM | POA: Diagnosis not present

## 2020-08-27 DIAGNOSIS — E785 Hyperlipidemia, unspecified: Secondary | ICD-10-CM | POA: Diagnosis not present

## 2020-08-27 DIAGNOSIS — Z0000 Encounter for general adult medical examination without abnormal findings: Secondary | ICD-10-CM | POA: Diagnosis not present

## 2020-08-27 DIAGNOSIS — Z125 Encounter for screening for malignant neoplasm of prostate: Secondary | ICD-10-CM | POA: Diagnosis not present

## 2020-08-27 NOTE — Progress Notes (Signed)
NEUROLOGY CONSULTATION NOTE  DELRICO MINEHART MRN: [id number] DOB: [birth year]  Referring provider: Jani Gravel, MD Primary care provider: Jani Gravel, MD  Reason for consult:  Memory deficits  HISTORY OF PRESENT ILLNESS: Nathan Ballard is a 59 year old right-handed man with hypertension and hyperlipidemia who presents for memory deficits  He is accompanied by his wife who provides some history as well.  His family has noticed memory problems since March-April 2021.  Sometimes when he is asked a question, he has a blank stare.  He has had trouble getting to places when driving on familiar routes but no trouble finding his way home.  No difficulty managing finances.  BCIT score was 2 (had trouble with name and address).  Symptoms do coincide with when he lost his job due to [place] pandemic.  He has since been trying to find another job which has been frustrating.  He reports that his mother was diagnosed with Alzheimer's dementia in her late 9s-early 31s.   Over the last few months, he reports a new severe paroxysmal sharp pain in right temple lasting 5 to 8 seconds.  It has only occurred twice.   I saw him back in 2017 for spells with hypoxia and hypercapnia  He has had recurrent episodes in which he is unable to be woken up from a deep sleep with labored breathing.  It would last 10 to 15 minutes.  After he wakes up, he is either lucid or somewhat confused.  On a couple of occasions, he was given Narcan by EMS and he awoke.  However, UDS was negative so it was thought to be coincidental.  He had several ED visits.  On 02/28/16, he was admitted to [facility] following another spell.  He was started on Keppra 500mg  twice daily.  CT of head was unremarkable.  MRI of brain personally reviewed only revealed a right frontal developmental venous anomaly (stable compared to MRI in 2008), but no acute process.  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  He became unresponsive in the hospital and had an O2 sat of 19% and was making gurgling noises.  He was intubated for airway protection.  He was hypotensive but did not lose pulse. Echocardiogram was unremarkable and showed EF 55-60% with grade 1 diastolic dysfunction.  Routine EEG and overnight EEG were normal.  He was discharged on Keppra 1500mg  twice daily.  Acute hypoxic and hypercapnic respiratory failure was felt to be secondary to seizure.  He returned to the ED on 03/22/16 after another spell.  He was started on Depakote.  Later that evening, he was standing in the kitchen and developed a glassy look in his eyes.  He was slow to respond.  He was sitting and awake but started to shake all over, followed by loss of consciousness.  Otherwise, he does not demonstrate any convulsions.  His wife drove him to nearby fire station for vitals, where his O2 sat was 68% on room air and 86% on O2.  He returned to the ED. Blood pressure was in the 90s/60s and pulse in the 40s.  He was given a loading dose of IV Depacon.   He continued to have spells in which he seems a little confused.  He does not demonstrate tongue biting or urinary/bowel incontinence.  Blood sugar has always been okay.  He was readmitted  to the hospital on 03/29/16.  Long-term EEG was unremarkable.  Keppra was discontinued and he was started on gabapentin 300mg  three times daily.  He remained on Depakote.  72 hour EEG from 04/09/16 to 04/11/16 was normal but did not capture his habitual spells.  He had another spell on 04/14/16.  He was seen by pulmonology and cardiology, both did not suspect primary pulmonary or cardiac etiology.  Since then, he has been doing well.  He had taken himself off Depakote and gabapentin, as well as Cymbalta.  He has not had any other spells.  He has not had any apneic episodes during sleep.  He feels better.  His memory is better.  He was  referred to the EMU at [facility] for further evaluation, as well as a sleep study, but he cancelled both since he had been feeling well.  Unclear if spells were central apneic.  He had a similar episode of confusion in 2008.  MRI of brain with and without contrast on 11/10/07 demonstrated the incidental developmental venous anomaly.  Episodes subsequently resolved and hasn't had another spell since 2017.  PAST MEDICAL HISTORY: Past Medical History:  Diagnosis Date  . Anxiety   . Depression   . Diverticulosis of colon   . History of adenomatous polyp of colon    09-03-2012 TUBUAL ADENOMA AND HYPERPLASTIC  . History of basal cell carcinoma excision    08-21-2014 SCALP;  02-15-2015 LEFT LATERAL NECK  . History of idiopathic seizure [place] NEUROLOGIST-  DR Jamontae Thwaites ([place])-- PER NOTE SPELLS OF SEIZURE-LIKE ACTITIVY W/ HYPOXIA AND HYPERCAPNIA ETOLOGY UNCLEAR, SEMIOLOGY STILL NOT TYPICAL FOR SEIZURE/ CENTRAL APNEIC SPELL VERSUS CYMBALTA SIDE EFFECT   per pt last spell 06/ 2017 none since / on 02-28-2016 ED VISIT--  PROBABLE SEIZURE W/ ACUTE RESPIRATORY FAILURE W/ HYPOXIA REQUIRED INTUBATION (CARDIAC AND PULMONARY CAUSES RULED OUT)  . Hyperlipidemia   . Hypertension   . OA (osteoarthritis)   . Subcutaneous cyst    LOWER ABDOMIN AND RIGHT FOREARM  . Venous angioma of brain [facility])    incidental finding on MRI 12/ 2008  posterior right frontal lobe angioma (asymptomatic) (MRI done for disorientation)  . Wears contact lenses     PAST SURGICAL HISTORY: Past Surgical History:  Procedure Laterality Date  . ANTERIOR CRUCIATE LIGAMENT REPAIR Left 1989; 2007  . COLONOSCOPY    . COLONOSCOPY WITH PROPOFOL  09/03/2012  . KNEE ARTHROSCOPY WITH MEDIAL MENISECTOMY Right 09/19/2015   Procedure: RIGHT KNEE ARTHROSCOPY WITH PARTIAL MEDIAL MENISCECTOMY;  Surgeon: Leandrew Koyanagi, MD;  Location: [place];  Service: Orthopedics;  Laterality: Right;  . LIPOMA EXCISION Right 05/01/2017   Procedure:  EXCISION OF CYST FROM RIGHT FOREARM AND LOWER ABDOMEN;  Surgeon: Clovis Riley, MD;  Location: [place];  Service: General;  Laterality: Right;  site= right arm and abdomen  . POLYPECTOMY    . REMOVAL GREAT TOE NAIL Right 1993  . SHOULDER SURGERY Right 1999  . TRANSTHORACIC ECHOCARDIOGRAM  03/01/2016   GRADE 1 DIASTOLIC DYSFUNCTION/  EF 55-60%  . WRIST GANGLION EXCISION Right 1998    MEDICATIONS: Current Outpatient Medications on File Prior to Visit  Medication Sig Dispense Refill  . ALPRAZolam (XANAX) 0.25 MG tablet     . Apoaequorin (PREVAGEN) 10 MG CAPS Take 1 capsule by mouth daily.    Marland Kitchen atorvastatin (LIPITOR) 40 MG tablet Take 20 mg by mouth every morning. 1/2 tablet of 40 mg    . Biotin 1000 MCG  tablet Take 1,000 mcg by mouth daily.    . calcium-vitamin D (OSCAL WITH D) 500-200 MG-UNIT tablet Take 1 tablet by mouth 3 (three) times daily. 90 tablet 12  . Cholecalciferol (VITAMIN D-3) 1000 units CAPS Take 1 capsule by mouth every morning.    . Misc Natural Products (OSTEO BI-FLEX JOINT SHIELD) TABS Take 2 tablets by mouth daily.    . Multiple Vitamin (MULTIVITAMIN) tablet Take 1 tablet by mouth daily.    . nebivolol (BYSTOLIC) 10 MG tablet Take 10 mg by mouth every morning.     Marland Kitchen tiZANidine (ZANAFLEX) 4 MG tablet Take 1 tablet (4 mg total) by mouth every 6 (six) hours as needed for muscle spasms. 30 tablet 2  . zinc sulfate 220 (50 Zn) MG capsule Take 1 capsule (220 mg total) by mouth daily. 42 capsule 0   Current Facility-Administered Medications on File Prior to Visit  Medication Dose Route Frequency Provider Last Rate Last Admin  . 0.9 %  sodium chloride infusion  500 mL Intravenous Continuous Pyrtle, Lajuan Lines, MD        ALLERGIES: Allergies  Allergen Reactions  . Morphine And Related Nausea And Vomiting    FAMILY HISTORY: Family History  Problem Relation Age of Onset  . Diabetes Father   . Heart disease Father   . Hypertension Father   . Alzheimer's  disease Mother   . Alzheimer's disease Maternal Grandmother   . Colon cancer Neg Hx   . Stomach cancer Neg Hx   . Colon polyps Neg Hx   . Esophageal cancer Neg Hx   . Rectal cancer Neg Hx    SOCIAL HISTORY: Social History   Socioeconomic History  . Marital status: Married    Spouse name: Not on file  . Number of children: Not on file  . Years of education: Not on file  . Highest education level: Not on file  Occupational History  . Not on file  Tobacco Use  . Smoking status: Never Smoker  . Smokeless tobacco: Never Used  Substance and Sexual Activity  . Alcohol use: Yes    Comment: rarely  . Drug use: No  . Sexual activity: Not on file  Other Topics Concern  . Not on file  Social History Narrative  . Not on file   Social Determinants of Health   Financial Resource Strain:   . Difficulty of Paying Living Expenses: Not on file  Food Insecurity:   . Worried About Charity fundraiser in the Last Year: Not on file  . Ran Out of Food in the Last Year: Not on file  Transportation Needs:   . Lack of Transportation (Medical): Not on file  . Lack of Transportation (Non-Medical): Not on file  Physical Activity:   . Days of Exercise per Week: Not on file  . Minutes of Exercise per Session: Not on file  Stress:   . Feeling of Stress : Not on file  Social Connections:   . Frequency of Communication with Friends and Family: Not on file  . Frequency of Social Gatherings with Friends and Family: Not on file  . Attends Religious Services: Not on file  . Active Member of Clubs or Organizations: Not on file  . Attends Archivist Meetings: Not on file  . Marital Status: Not on file  Intimate Partner Violence:   . Fear of Current or Ex-Partner: Not on file  . Emotionally Abused: Not on file  . Physically Abused: Not on  file  . Sexually Abused: Not on file    PHYSICAL EXAM: Blood pressure 138/84, pulse (!) 55, height 6' (1.829 m), weight 210 lb (95.3 kg), SpO2 97  %. General: No acute distress.  Patient appears well-groomed.  Head:  Normocephalic/atraumatic Eyes:  fundi examined but not visualized Neck: supple, no paraspinal tenderness, full range of motion Back: No paraspinal tenderness Heart: regular rate and rhythm Lungs: Clear to auscultation bilaterally. Vascular: No carotid bruits. Neurological Exam: Mental status:    [employer] Mental Exam 08/28/2020  Weekday Correct 1  Current year 1  What state are we in? 1  Amount spent 1  Amount left 2  # of Animals 3  5 objects recall 0  Number series 1  Hour markers 2  Time correct 2  Placed X in triangle correctly 1  Largest Figure 1  Name of male 2  Date back to work 0  Type of work 2  State she lived in 2  Total score 22   Cranial nerves: CN I: not tested CN II: pupils equal, round and reactive to light, visual fields intact CN III, IV, VI:  full range of motion, no nystagmus, no ptosis CN V: facial sensation intact CN VII: upper and lower face symmetric CN VIII: hearing intact CN IX, X: gag intact, uvula midline CN XI: sternocleidomastoid and trapezius muscles intact CN XII: tongue midline Bulk & Tone: normal, no fasciculations. Motor:  5/5 throughout  Sensation:  Pinprick and vibration sensation intact. Deep Tendon Reflexes:  2+ throughout, toes downgoing.  Finger to nose testing:  Without dysmetria.  Heel to shin:  Without dysmetria.  Gait:  Normal station and stride.  Able to turn and tandem walk. Romberg negative.  IMPRESSION: 1.  Memory changes.  It is possible that it may be related to adjustment disorder in regards to losing his job.  However, his mother had early-onset Alzheimer's dementia.  Therefore, further testing is warranted.  Unlikely to be secondary to recurrent episodes of hypoxia as symptoms have started years after last episode. 2.  Paroxysmal headache.  Neurologic exam unremarkable and only occurred twice.  Will monitor.  PLAN: 1.  MRI of  brain with and without contrast 2.  Check B12, TSH 3.  Neuropsychological testing 4.  Follow up after testing.   Thank you for allowing me to take part in the care of this patient.  Metta Clines, DO  CC: Jani Gravel, MD

## 2020-08-28 ENCOUNTER — Other Ambulatory Visit: Payer: Self-pay

## 2020-08-28 ENCOUNTER — Other Ambulatory Visit (INDEPENDENT_AMBULATORY_CARE_PROVIDER_SITE_OTHER): Payer: BC Managed Care – PPO

## 2020-08-28 ENCOUNTER — Encounter: Payer: Self-pay | Admitting: Neurology

## 2020-08-28 ENCOUNTER — Ambulatory Visit (INDEPENDENT_AMBULATORY_CARE_PROVIDER_SITE_OTHER): Payer: BC Managed Care – PPO | Admitting: Neurology

## 2020-08-28 VITALS — BP 138/84 | HR 55 | Ht 72.0 in | Wt 210.0 lb

## 2020-08-28 DIAGNOSIS — R519 Headache, unspecified: Secondary | ICD-10-CM

## 2020-08-28 DIAGNOSIS — R413 Other amnesia: Secondary | ICD-10-CM

## 2020-08-28 LAB — VITAMIN B12: Vitamin B-12: 698 pg/mL (ref 211–911)

## 2020-08-28 LAB — TSH: TSH: 1.64 u[IU]/mL (ref 0.35–4.50)

## 2020-08-28 NOTE — Patient Instructions (Addendum)
1.  We will check B12 and TSH levels. Your provider has requested that you have labwork completed today. Please go to [facility] ([id number]) on the second floor of this building before leaving the office today. You do not need to check in. If you are not called within 15 minutes please check with the front desk.  2.  We will check MRI of brain with and without contrast. We have sent a referral to [facility] for your MRI and they will call you directly to schedule your appointment. They are located at [place]. If you need to contact them directly please call [phone].  3.  I will refer you for neuropsychological testing (should be scheduled after MRI) 4.  Follow up with me after testing completed.

## 2020-08-29 ENCOUNTER — Telehealth: Payer: Self-pay

## 2020-08-29 NOTE — Telephone Encounter (Signed)
-----   Message from Pieter Partridge, DO sent at 08/29/2020  7:45 AM EDT ----- Labs are normal

## 2020-08-29 NOTE — Telephone Encounter (Signed)
Called patient and informed him of results.

## 2020-09-03 DIAGNOSIS — Z0000 Encounter for general adult medical examination without abnormal findings: Secondary | ICD-10-CM | POA: Diagnosis not present

## 2020-09-03 DIAGNOSIS — I10 Essential (primary) hypertension: Secondary | ICD-10-CM | POA: Diagnosis not present

## 2020-09-16 ENCOUNTER — Ambulatory Visit
Admission: RE | Admit: 2020-09-16 | Discharge: 2020-09-16 | Disposition: A | Discharge status: Home / Self Care | Payer: BC Managed Care – PPO | Source: Ambulatory Visit | Attending: Neurology | Admitting: Neurology

## 2020-09-16 DIAGNOSIS — R413 Other amnesia: Secondary | ICD-10-CM

## 2020-09-16 DIAGNOSIS — Q283 Other malformations of cerebral vessels: Secondary | ICD-10-CM | POA: Diagnosis not present

## 2020-09-16 DIAGNOSIS — R519 Headache, unspecified: Secondary | ICD-10-CM

## 2020-09-16 DIAGNOSIS — J3489 Other specified disorders of nose and nasal sinuses: Secondary | ICD-10-CM | POA: Diagnosis not present

## 2020-09-16 DIAGNOSIS — J341 Cyst and mucocele of nose and nasal sinus: Secondary | ICD-10-CM | POA: Diagnosis not present

## 2020-09-16 MED ORDER — GADOBENATE DIMEGLUMINE 529 MG/ML IV SOLN
20.0000 mL | Freq: Once | INTRAVENOUS | Status: AC | PRN
  Administered 2020-09-16: 20 mL via INTRAVENOUS

## 2020-09-17 ENCOUNTER — Telehealth: Payer: Self-pay

## 2020-09-17 NOTE — Telephone Encounter (Signed)
Called patient and informed him of results. Patient verbalized understanding.

## 2020-09-17 NOTE — Telephone Encounter (Signed)
-----   Message from Pieter Partridge, DO sent at 09/17/2020 10:21 AM EST ----- MRI of brain shows nothing new or concerning.  The developmental venous anomaly seen on the prior MRI is stable, incidental finding.

## 2020-09-27 ENCOUNTER — Ambulatory Visit: Payer: BC Managed Care – PPO | Admitting: Psychology

## 2020-09-27 ENCOUNTER — Encounter: Payer: Self-pay | Admitting: Psychology

## 2020-09-27 ENCOUNTER — Other Ambulatory Visit: Payer: Self-pay

## 2020-09-27 ENCOUNTER — Ambulatory Visit (INDEPENDENT_AMBULATORY_CARE_PROVIDER_SITE_OTHER): Payer: BC Managed Care – PPO | Admitting: Psychology

## 2020-09-27 DIAGNOSIS — G3184 Mild cognitive impairment, so stated: Secondary | ICD-10-CM

## 2020-09-27 DIAGNOSIS — J9601 Acute respiratory failure with hypoxia: Secondary | ICD-10-CM

## 2020-09-27 DIAGNOSIS — R4189 Other symptoms and signs involving cognitive functions and awareness: Secondary | ICD-10-CM

## 2020-09-27 DIAGNOSIS — F411 Generalized anxiety disorder: Secondary | ICD-10-CM

## 2020-09-27 HISTORY — DX: Mild cognitive impairment of uncertain or unknown etiology: G31.84

## 2020-09-27 NOTE — Progress Notes (Signed)
   Psychometrician Note   Cognitive testing was administered to Nathan Ballard by Milana Kidney, B.S. (psychometrist) under the supervision of Dr. Christia Reading, Ph.D., licensed psychologist on 09/27/20. Nathan Ballard did not appear overtly distressed by the testing session per behavioral observation or responses across self-report questionnaires. Dr. Christia Reading, Ph.D. checked in with Nathan Ballard as needed to manage any distress related to testing procedures (if applicable). Rest breaks were offered.    The battery of tests administered was selected by Dr. Christia Reading, Ph.D. with consideration to Nathan Ballard current level of functioning, the nature of his symptoms, emotional and behavioral responses during interview, level of literacy, observed level of motivation/effort, and the nature of the referral question. This battery was communicated to the psychometrist. Communication between Dr. Christia Reading, Ph.D. and the psychometrist was ongoing throughout the evaluation and Dr. Christia Reading, Ph.D. was immediately accessible at all times. Dr. Christia Reading, Ph.D. provided supervision to the psychometrist on the date of this service to the extent necessary to assure the quality of all services provided.    Nathan Ballard will return within approximately 1-2 weeks for an interactive feedback session with Dr. Melvyn Novas at which time his test performances, clinical impressions, and treatment recommendations will be reviewed in detail. Nathan Ballard understands he can contact our office should he require our assistance before this time.  A total of 170 minutes of billable time were spent face-to-face with Nathan Ballard by the psychometrist. This includes both test administration and scoring time. Billing for these services is reflected in the clinical report generated by Dr. Christia Reading, Ph.D..  This note reflects time spent with the psychometrician and does not include test scores or any  clinical interpretations made by Dr. Melvyn Novas. The full report will follow in a separate note.

## 2020-09-27 NOTE — Progress Notes (Signed)
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 (19) <1 Exceptionally Low       Attention/Executive Function:          Trail Making Test (TMT): Raw Score (T Score) Percentile     Part A 22 secs.,  0 errors (56) 73 Average    Part B 48 secs.,  0 errors (63) 91 Well Above Average         Scaled Score Percentile   WAIS-IV Coding: 10 50 Average       NAB Attention Module, Form 1: T Score Percentile     Digits Forward 46 34 Average    Digits Backwards 42 21 Below Average       D-KEFS Color-Word Interference Test: Raw Score (Scaled Score) Percentile     Color Naming 26 secs. (12) 75 Above Average    Word Reading 22 secs. (11) 63 Average    Inhibition 49 secs. (12) 75 Above Average      Total Errors 0 errors (12) 75 Above Average    Inhibition/Switching 61 secs. (12) 75 Above Average      Total Errors 1 error (11) 63 Average       D-KEFS Verbal Fluency Test: Raw Score (Scaled Score) Percentile     Letter Total Correct 34 (9) 37 Average    Category Total Correct 51 (15) 95 Well Above Average    Category Switching Total Correct 8 (4) 2 Well Below Average    Category Switching Accuracy 7 (5) 5 Well Below Average       Total Set Loss Errors 1 (11) 63 Average      Total Repetition Errors 5 (8) 25 Average       D-KEFS Design Fluency Test: Raw Score (Scaled Score) Percentile     Condition 1 - Filled Dots 6 (7) 16 Below Average    Condition 2 - Empty Dots 7 (7) 16 Below Average    Condition 3 - Switching 6 (9) 37 Average      Total Set Loss Errors 0 (13) 84 Above Average      Total Repetition Errors 1 (13) 84 Above Average       D-KEFS 20 Questions  Test: Scaled Score Percentile     Total Weighted Achievement Score 12 75 Above Average    Initial Abstraction Score 8 25 Average       [place] Card Sorting Test: Raw Score Percentile     Categories (trials) 4 (64) >16 Within Normal Limits    Total Errors 15 46 Average    Perseverative Errors 7 50 Average    Non-Perseverative Errors 8 25 Average    Failure to Maintain Set 0 --- ---       Language:          Verbal Fluency Test: Raw Score (T Score) Percentile     Phonemic Fluency (FAS) 34 (45) 31 Average    Animal Fluency 28 (66) 95 Well Above Average        NAB Language Module, Form 1: T Score Percentile     Auditory Comprehension 56 73 Average    Naming 31/31 (54) 66 Average       Visuospatial/Visuoconstruction:      Raw Score Percentile   Clock Drawing: 8/10 --- Within Normal Limits       NAB Spatial Module, Form 1: T Score Percentile     Figure Drawing Copy 54 66 Average        Scaled Score Percentile   WAIS-IV Block Design: 12 75 Above Average  WAIS-IV Matrix Reasoning: 11 63 Average       Sensory-Motor:          [place] Grooved Pegboard Test: Raw Score Percentile     Dominant Hand 70 secs.,  0 drops  58 Average    Non-Dominant Hand 82 secs.,  0 drops  38 Average       Mood and Personality:      Raw Score Percentile   Beck Depression Inventory - II: 8 --- Within Normal Limits  PROMIS Anxiety Questionnaire: 18 --- Mild       Additional Questionnaires:      Raw Score Percentile   PROMIS Sleep Disturbance Questionnaire: 20 ---  None to Slight   Informed Consent and Coding/Compliance:   Mr. Walmer was provided with a verbal description of the nature and purpose of the present neuropsychological evaluation. Also reviewed were the foreseeable risks and/or discomforts and benefits of the procedure, limits of confidentiality, and mandatory reporting requirements of this provider. The patient was given the opportunity to ask questions and receive answers about the evaluation. Oral consent to participate was provided by the patient.   This evaluation was conducted by Christia Reading, Ph.D., licensed clinical neuropsychologist. Mr. Hatlestad completed a comprehensive clinical interview with Dr. Melvyn Novas, billed as one unit [phone], and 170 minutes of cognitive testing and scoring, billed as one unit [phone] and five additional units [phone]. Psychometrist Milana Kidney, B.S., assisted Dr. Melvyn Novas with test administration and scoring procedures. As a separate and discrete service, Dr. Melvyn Novas spent a total of 130 minutes in interpretation and report writing billed as one unit [phone] and one unit [phone].

## 2020-10-08 ENCOUNTER — Ambulatory Visit (INDEPENDENT_AMBULATORY_CARE_PROVIDER_SITE_OTHER): Payer: BC Managed Care – PPO | Admitting: Psychology

## 2020-10-08 ENCOUNTER — Other Ambulatory Visit: Payer: Self-pay

## 2020-10-08 DIAGNOSIS — G3184 Mild cognitive impairment, so stated: Secondary | ICD-10-CM

## 2020-10-08 DIAGNOSIS — J9601 Acute respiratory failure with hypoxia: Secondary | ICD-10-CM | POA: Diagnosis not present

## 2020-10-08 NOTE — Patient Instructions (Signed)
A repeat neuropsychological evaluation in 24 months (or sooner if functional decline is noted) is recommended to assess the trajectory of future cognitive decline should it occur. This will also aid in future efforts towards improved diagnostic clarity.  Mr. Nathan Ballard could discuss the pros and cons of starting an acetylcholinesterase inhibitor medication with Dr. Tomi Likens.   Mr. [name] is encouraged to attend to lifestyle factors for brain health (e.g., regular physical exercise, good nutrition habits, regular participation in cognitively-stimulating activities, and general stress management techniques), which are likely to have benefits for both emotional adjustment and cognition. In fact, in addition to promoting good general health, regular exercise incorporating aerobic activities (e.g., brisk walking, jogging, cycling, etc.) has been demonstrated to be a very effective treatment for depression and stress, with similar efficacy rates to both antidepressant medication and psychotherapy. Optimal control of vascular risk factors (including safe cardiovascular exercise and adherence to dietary recommendations) is encouraged.   If interested, there are some activities which have therapeutic value and can be useful in keeping him cognitively stimulated. For suggestions, Mr. [name] is encouraged to go to the following website: https://www.[facility].org/get-to-know-barrow/centers-programs/neurorehabilitation-center/neuro-rehab-apps-and-games/ which has options, categorized by level of difficulty. It should be noted that these activities should not be viewed as a substitute for therapy.  Memory can be improved using internal strategies such as rehearsal, repetition, chunking, mnemonics, association, and imagery. External strategies such as written notes in a consistently used memory journal, visual and nonverbal auditory cues such as a calendar on the refrigerator or appointments with alarm, such as on a cell  phone, can also help maximize recall.    He should continue to routinely use memory compensatory techniques. It is also recommended that he utilize multiple strategies when learning new information, such as repeating it, writing it down, and putting it into his own words to promote encoding through multi-modal learning.  To address problems with fluctuating attention, he may wish to consider:   -Avoiding external distractions when needing to concentrate   -Limiting exposure to fast paced environments with multiple sensory demands   -Writing down complicated information and using checklists   -Attempting and completing one task at a time (i.e., no multi-tasking)   -Verbalizing aloud each step of a task to maintain focus   -Reducing the amount of information considered at one time

## 2020-10-08 NOTE — Progress Notes (Signed)
   Neuropsychology Feedback Session Nathan Ballard. [facility]  Reason for Referral:   Nathan Ballard a 59 y.o. right-handed Caucasian male referred by Metta Clines, D.O.,to characterize hiscurrent cognitive functioning and assist with diagnostic clarity and treatment planning in the context of subjective cognitive decline, a history of hypoxia secondary to seizure activity, and family history of early-onset Alzheimer's disease.   Feedback:   Nathan Ballard completed a comprehensive neuropsychological evaluation on 09/27/2020. Please refer to that encounter for the full report and recommendations. Briefly, results suggested consistent impairment across encoding (i.e., learning), retrieval, and consolidation aspects of verbal memory. An isolated relative weakness was exhibited across a semantic fluency set-shifting task; however, all other tasks assessing executive functioning were consistently strong. The etiology of his verbal memory impairment is somewhat unclear. His medical records suggest a history of hypoxic respiratory failure secondary to seizure activity. Anatomically, memory structures within the brain are very susceptible to damage stemming from hypoxic events and there remains the potential that this represents the primary culprit for this largely isolated area of impairment. Despite this, Nathan Ballard reported that subjective memory loss was first observed in March/April 2021, nearly four years following his most recent hypoxic spell (2017). If this timeline is accurate and this does not represent a case of ongoing memory deficits being present but overlooked during those four years, a delayed emergence of memory loss would be unexpected and could suggest a newer developing etiology.  Nathan Ballard was accompanied by his wife during the current feedback appointment. Content of the current session focused on the results of his neuropsychological  evaluation. Nathan Ballard and his wife were given the opportunity to ask questions and their questions were answered. They were encouraged to reach out should additional questions arise. A copy of his report was provided at the conclusion of the visit.      25 minutes were spent conducting the current feedback session with Nathan Ballard, billed as one unit [phone].

## 2020-10-17 DIAGNOSIS — Z85828 Personal history of other malignant neoplasm of skin: Secondary | ICD-10-CM | POA: Diagnosis not present

## 2020-10-17 DIAGNOSIS — C44519 Basal cell carcinoma of skin of other part of trunk: Secondary | ICD-10-CM | POA: Diagnosis not present

## 2020-10-17 DIAGNOSIS — D2262 Melanocytic nevi of left upper limb, including shoulder: Secondary | ICD-10-CM | POA: Diagnosis not present

## 2020-10-17 DIAGNOSIS — D225 Melanocytic nevi of trunk: Secondary | ICD-10-CM | POA: Diagnosis not present

## 2020-10-17 DIAGNOSIS — D2261 Melanocytic nevi of right upper limb, including shoulder: Secondary | ICD-10-CM | POA: Diagnosis not present

## 2020-10-24 NOTE — Progress Notes (Signed)
NEUROLOGY FOLLOW UP OFFICE NOTE  Nathan Ballard [id number]   Subjective:  Nathan Ballard is a 59 year old right-handed man with hypertension and hyperlipidemia who follows up for memory deficits He is accompanied by his wife who provides some history as well.  UPDATE: He underwent workup for memory loss. -  Labs from October showed B12 698 and TSH 1.64 - MRI of brain with and without contrast on 09/17/2020 personally reviewed showed stable right frontal developmental venous anomaly but otherwise unremarkable. - Neuropsychological evaluation on 09/27/2020 demonstrated mild neurocognitive disorder with impairment across encoding, retrieval, and consolidation aspects of verbal memory.  Etiology unclear.  Despite weakness in verbal memory, he did not display a pattern of performance consistent with early-onset Alzheimer's disease but could not rule out.    HISTORY: His family has noticed memory problems since June 2021, after he lost his job related to the COVID-19 pandemic.  Sometimes when he is asked a question, he has a blank stare.  He has had trouble getting to places when driving on familiar routes but no trouble finding his way home.  No difficulty managing finances.  BCIT score was 2 (had trouble with name and address).  He has since been trying to find another job which has been frustrating.  He reports that his mother was diagnosed with Alzheimer's dementia in her late 52s-early 59s.   Over the last few months, he reports a new severe paroxysmal sharp pain in right temple lasting 5 to 8 seconds.  It has only occurred twice.   I saw him back in 2017 for spells with hypoxia and hypercapnia He has had recurrent episodes in which he is unable to be woken up from a deep sleep with labored breathing. It would last 10 to 15 minutes. After he wakes up, he is either lucid or somewhat confused. On a couple of occasions, he was given Narcan by EMS and he awoke. However, UDS was negative  so it was thought to be coincidental. He had several ED visits. On 02/28/16, he was admitted to [facility] following another spell. He was started on Keppra 500mg  twice daily. CT of head was unremarkable. MRI of brain personally reviewed only revealed a right frontal developmental venous anomaly (stable compared to MRI in 2008), but no acute process. 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 He became unresponsive in the hospital and had an O2 sat of 19% and was making gurgling noises. He was intubated for airway protection. He was hypotensive but did not lose pulse. Echocardiogram was unremarkable and showed EF 55-60% with grade 1 diastolic dysfunction. Routine EEG and overnight EEG were normal. He was discharged on Keppra 1500mg  twice daily. Acute hypoxic and hypercapnic respiratory failure was felt to be secondary to seizure. He returned to the ED on 03/22/16 after another spell. He was started on Depakote. Later that evening, he was standing in the kitchen and developed a glassy look in his eyes. He was slow to respond. He was sitting and awake but started to shake all over, followed by loss of consciousness. Otherwise, he does not demonstrate any convulsions. His wife drove him to nearby fire station for vitals, where his O2 sat was 68% on room air and 86% on O2. He returned to the ED. Blood pressure was in the 90s/60s and pulse in the 40s. He was given a  loading dose of IV Depacon. He continued to have spells in which he seems a little confused. He does not demonstrate tongue biting or urinary/bowel incontinence. Blood sugar has always been okay.  He was readmitted to the hospital on 03/29/16. Long-term EEG was unremarkable. Keppra was discontinued and he was started on gabapentin 300mg  three times daily. He remained on Depakote. 72 hour EEG  from 04/09/16 to 04/11/16 was normal but did not capture his habitual spells. He had another spell on 04/14/16. He was seen by pulmonology and cardiology, both did not suspect primary pulmonary or cardiac etiology. Since then, he has been doing well. He had taken himself off Depakote and gabapentin, as well as Cymbalta.He has not had any other spells. He has not had any apneic episodes during sleep. He feels better. His memory is better. He was referred to the EMU at [facility] for further evaluation, as well as a sleep study, but he cancelled both since he had been feeling well.  Unclear if spells were central apneic.  He had a similar episode of confusion in 2008. MRI of brain with and without contrast on 11/10/07 demonstrated the incidental developmental venous anomaly. Episodes subsequently resolved and hasn't had another spell since 2017.  PAST MEDICAL HISTORY: Past Medical History:  Diagnosis Date  . Acute respiratory failure with hypoxia 03/02/2016   secondary to seizure activity  . 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 Arthritis of carpometacarpal (CMC) joint of left thumb   . 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 Diverticulosis of colon   . Essential hypertension   . Generalized anxiety disorder   . History of adenomatous polyp of colon 09/03/2012   tubual adenoma and hyperplastic  . History of basal cell carcinoma excision 08/21/2014   scalp; also left lateral neck (02/15/2015)  . History of COVID-19 06/2020  . Hyperlipidemia   . Hypomagnesemia 03/02/2016  . Leukocytosis   . Major depressive disorder 02/29/2016  . Mild neurocognitive disorder, unclear etiology 09/27/2020  . OA (osteoarthritis)   . 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 Subcutaneous cyst    lower abdomen and right forearm    MEDICATIONS: Current  Outpatient Medications on File Prior to Visit  Medication Sig Dispense Refill  . ALPRAZolam (XANAX) 0.25 MG tablet     . Apoaequorin (PREVAGEN) 10 MG CAPS Take 1 capsule by mouth daily. (Patient not taking: Reported on 08/28/2020)    . atorvastatin (LIPITOR) 40 MG tablet Take 20 mg by mouth every morning. 1/2 tablet of 40 mg    . Biotin 1000 MCG tablet Take 1,000 mcg by mouth daily.    . Cholecalciferol (VITAMIN D-3) 1000 units CAPS Take 1 capsule by mouth every morning.    . Misc Natural Products (OSTEO BI-FLEX JOINT SHIELD) TABS Take 2 tablets by mouth daily.    . Multiple Vitamin (MULTIVITAMIN) tablet Take 1 tablet by mouth daily.    . nebivolol (BYSTOLIC) 10 MG tablet Take 10 mg by mouth every morning.     Marland Kitchen tiZANidine (ZANAFLEX) 4 MG tablet Take 1 tablet (4 mg total) by mouth every 6 (six) hours as needed for muscle spasms. (Patient not taking: Reported on 08/28/2020) 30 tablet 2  . zinc sulfate 220 (50 Zn) MG capsule Take 1 capsule (220 mg total) by mouth daily. 42 capsule 0   Current Facility-Administered Medications on File Prior to Visit  Medication Dose Route Frequency Provider Last Rate Last Admin  . 0.9 %  sodium chloride infusion  500 mL Intravenous Continuous Pyrtle, Lajuan Lines, MD        ALLERGIES: Allergies  Allergen Reactions  . Morphine And Related Nausea And Vomiting    FAMILY HISTORY: Family History  Problem Relation Age of Onset  . Diabetes Father   . Heart disease Father   . Hypertension Father   . Alzheimer's disease Mother        early-onset presentation; late 40s/early 40s  . Alzheimer's disease Maternal Grandmother        symptom onset during late 70s/early 80s  . Alzheimer's disease Maternal Aunt        symptom onset during late 70s/early 80s  . Colon cancer Neg Hx   . Stomach cancer Neg Hx   . Colon polyps Neg Hx   . Esophageal cancer Neg Hx   . Rectal cancer Neg Hx     SOCIAL HISTORY: Social History   Socioeconomic History  . Marital status:  Married    Spouse name: Not on file  . Number of children: Not on file  . Years of education: 57  . Highest education level: Some college, no degree  Occupational History  . Not on file  Tobacco Use  . Smoking status: Never Smoker  . Smokeless tobacco: Never Used  Substance and Sexual Activity  . Alcohol use: Yes    Comment: rarely  . Drug use: No  . Sexual activity: Not on file  Other Topics Concern  . Not on file  Social History Narrative   Right handed   Lives in one story home with wife   Social Determinants of Health   Financial Resource Strain: Not on file  Food Insecurity: Not on file  Transportation Needs: Not on file  Physical Activity: Not on file  Stress: Not on file  Social Connections: Not on file  Intimate Partner Violence: Not on file     Objective:  Blood pressure 132/72, pulse (!) 55, height 6' (1.829 m), weight 209 lb (94.8 kg), SpO2 97 %. General: No acute distress.  Patient appears well-groomed.   Assessment/Plan:   Mild neurocognitive disorder, unclear etiology  1.  Will start donepezil 5mg  at bedtime and increase to 10mg  at bedtime in 4 weeks if needed. 2.  Continue cardiopulmonary exercise and weights 3.  Proper sleep hygiene 4.  Mediterranean diet  5.  Mental exercises (puzzles, etc) and also always be learning new information (reading, documentaries, conversation) 6.  Follow up 6 months  Nathan Clines, Nathan Ballard  CC: Nathan Gravel, MD

## 2020-10-25 ENCOUNTER — Other Ambulatory Visit: Payer: Self-pay

## 2020-10-25 ENCOUNTER — Ambulatory Visit (INDEPENDENT_AMBULATORY_CARE_PROVIDER_SITE_OTHER): Payer: BC Managed Care – PPO | Admitting: Neurology

## 2020-10-25 ENCOUNTER — Encounter: Payer: Self-pay | Admitting: Neurology

## 2020-10-25 VITALS — BP 132/72 | HR 55 | Ht 72.0 in | Wt 209.0 lb

## 2020-10-25 DIAGNOSIS — G3184 Mild cognitive impairment, so stated: Secondary | ICD-10-CM | POA: Diagnosis not present

## 2020-10-25 MED ORDER — DONEPEZIL HCL 5 MG PO TABS: 5.0000 mg | ORAL_TABLET | Freq: Every day | ORAL | 0 refills | Status: DC

## 2020-10-25 NOTE — Patient Instructions (Signed)
1.  We will start donepezil (Aricept) 5mg  daily for four weeks.  If you are tolerating the medication, then after four weeks, we will increase the dose to 10mg  daily.  Side effects include nausea, vomiting, diarrhea, vivid dreams, and muscle cramps.  Please call the clinic if you experience any of these symptoms. 2.  Continue cardiopulmonary exercise and weights 3.  Proper sleep hygiene 4.  Mediterranean diet (See below) 5.  Mental exercises (puzzles, etc) and also always be learning new information (reading, documentaries, conversation) 6.  Follow up 6 months   Mediterranean Diet A Mediterranean diet refers to food and lifestyle choices that are based on the traditions of countries located on the [employer]. This way of eating has been shown to help prevent certain conditions and improve outcomes for people who have chronic diseases, like kidney disease and heart disease. What are tips for following this plan? Lifestyle  Cook and eat meals together with your family, when possible.  Drink enough fluid to keep your urine clear or pale yellow.  Be physically active every day. This includes: ? Aerobic exercise like running or swimming. ? Leisure activities like gardening, walking, or housework.  Get 7-8 hours of sleep each night.  If recommended by your health care provider, drink red wine in moderation. This means 1 glass a day for nonpregnant women and 2 glasses a day for men. A glass of wine equals 5 oz (150 mL). Reading food labels   Check the serving size of packaged foods. For foods such as rice and pasta, the serving size refers to the amount of cooked product, not dry.  Check the total fat in packaged foods. Avoid foods that have saturated fat or trans fats.  Check the ingredients list for added sugars, such as corn syrup. Shopping  At the grocery store, buy most of your food from the areas near the walls of the store. This includes: ? Fresh fruits and vegetables  (produce). ? Grains, beans, nuts, and seeds. Some of these may be available in unpackaged forms or large amounts (in bulk). ? Fresh seafood. ? Poultry and eggs. ? Low-fat dairy products.  Buy whole ingredients instead of prepackaged foods.  Buy fresh fruits and vegetables in-season from local farmers markets.  Buy frozen fruits and vegetables in resealable bags.  If you do not have access to quality fresh seafood, buy precooked frozen shrimp or canned fish, such as tuna, salmon, or sardines.  Buy small amounts of raw or cooked vegetables, salads, or olives from the deli or salad bar at your store.  Stock your pantry so you always have certain foods on hand, such as olive oil, canned tuna, canned tomatoes, rice, pasta, and beans. Cooking  Cook foods with extra-virgin olive oil instead of using butter or other vegetable oils.  Have meat as a side dish, and have vegetables or grains as your main dish. This means having meat in small portions or adding small amounts of meat to foods like pasta or stew.  Use beans or vegetables instead of meat in common dishes like chili or lasagna.  Experiment with different cooking methods. Try roasting or broiling vegetables instead of steaming or sauteing them.  Add frozen vegetables to soups, stews, pasta, or rice.  Add nuts or seeds for added healthy fat at each meal. You can add these to yogurt, salads, or vegetable dishes.  Marinate fish or vegetables using olive oil, lemon juice, garlic, and fresh herbs. Meal planning   Plan to  eat 1 vegetarian meal one day each week. Try to work up to 2 vegetarian meals, if possible.  Eat seafood 2 or more times a week.  Have healthy snacks readily available, such as: ? Vegetable sticks with hummus. ? [place] yogurt. ? Fruit and nut trail mix.  Eat balanced meals throughout the week. This includes: ? Fruit: 2-3 servings a day ? Vegetables: 4-5 servings a day ? Low-fat dairy: 2 servings a  day ? Fish, poultry, or lean meat: 1 serving a day ? Beans and legumes: 2 or more servings a week ? Nuts and seeds: 1-2 servings a day ? Whole grains: 6-8 servings a day ? Extra-virgin olive oil: 3-4 servings a day  Limit red meat and sweets to only a few servings a month What are my food choices?  Mediterranean diet ? Recommended  Grains: Whole-grain pasta. Brown rice. Bulgar wheat. Polenta. Couscous. Whole-wheat bread. [place].  Vegetables: Artichokes. Beets. Broccoli. Cabbage. Carrots. Eggplant. Green beans. Chard. Kale. Spinach. Onions. Leeks. Peas. Squash. Tomatoes. Peppers. Radishes.  Fruits: Apples. Apricots. Avocado. Berries. Bananas. Cherries. Dates. Figs. Grapes. Lemons. Melon. Oranges. Peaches. Plums. Pomegranate.  Meats and other protein foods: Beans. Almonds. Sunflower seeds. Pine nuts. Peanuts. [place]. Salmon. Scallops. Shrimp. [place]. Tilapia. Clams. Oysters. Eggs.  Dairy: Low-fat milk. Cheese. Greek yogurt.  Beverages: Water. Red wine. Herbal tea.  Fats and oils: Extra virgin olive oil. Avocado oil. Grape seed oil.  Sweets and desserts: [place] yogurt with honey. Baked apples. Poached pears. Trail mix.  Seasoning and other foods: Basil. Cilantro. Coriander. Cumin. Mint. Parsley. Sage. Rosemary. Tarragon. Garlic. Oregano. Thyme. Pepper. Balsalmic vinegar. Tahini. Hummus. Tomato sauce. Olives. Mushrooms. ? Limit these  Grains: Prepackaged pasta or rice dishes. Prepackaged cereal with added sugar.  Vegetables: Deep fried potatoes (french fries).  Fruits: Fruit canned in syrup.  Meats and other protein foods: Beef. Pork. Lamb. Poultry with skin. Hot dogs. [place] Salines.  Dairy: Ice cream. Sour cream. Whole milk.  Beverages: Juice. Sugar-sweetened soft drinks. Beer. Liquor and spirits.  Fats and oils: Butter. Canola oil. Vegetable oil. Beef fat (tallow). Lard.  Sweets and desserts: Cookies. Cakes. Pies. Candy.  Seasoning and other foods: Mayonnaise. Premade sauces  and marinades. The items listed may not be a complete list. Talk with your dietitian about what dietary choices are right for you. Summary  The Mediterranean diet includes both food and lifestyle choices.  Eat a variety of fresh fruits and vegetables, beans, nuts, seeds, and whole grains.  Limit the amount of red meat and sweets that you eat.  Talk with your health care provider about whether it is safe for you to drink red wine in moderation. This means 1 glass a day for nonpregnant women and 2 glasses a day for men. A glass of wine equals 5 oz (150 mL). This information is not intended to replace advice given to you by your health care provider. Make sure you discuss any questions you have with your health care provider. Document Revised: 06/26/2016 Document Reviewed: 06/19/2016 Elsevier Patient Education  [place].

## 2020-11-01 ENCOUNTER — Ambulatory Visit: Payer: Self-pay | Admitting: Neurology

## 2020-11-22 ENCOUNTER — Other Ambulatory Visit: Payer: Self-pay | Admitting: Neurology

## 2021-04-17 DIAGNOSIS — D2261 Melanocytic nevi of right upper limb, including shoulder: Secondary | ICD-10-CM | POA: Diagnosis not present

## 2021-04-17 DIAGNOSIS — L570 Actinic keratosis: Secondary | ICD-10-CM | POA: Diagnosis not present

## 2021-04-17 DIAGNOSIS — C4441 Basal cell carcinoma of skin of scalp and neck: Secondary | ICD-10-CM | POA: Diagnosis not present

## 2021-04-17 DIAGNOSIS — D225 Melanocytic nevi of trunk: Secondary | ICD-10-CM | POA: Diagnosis not present

## 2021-04-17 DIAGNOSIS — C44519 Basal cell carcinoma of skin of other part of trunk: Secondary | ICD-10-CM | POA: Diagnosis not present

## 2021-04-17 DIAGNOSIS — D2262 Melanocytic nevi of left upper limb, including shoulder: Secondary | ICD-10-CM | POA: Diagnosis not present

## 2021-04-17 DIAGNOSIS — C44612 Basal cell carcinoma of skin of right upper limb, including shoulder: Secondary | ICD-10-CM | POA: Diagnosis not present

## 2021-04-17 DIAGNOSIS — Z85828 Personal history of other malignant neoplasm of skin: Secondary | ICD-10-CM | POA: Diagnosis not present

## 2021-04-19 ENCOUNTER — Ambulatory Visit: Payer: Self-pay

## 2021-04-19 ENCOUNTER — Ambulatory Visit: Payer: BC Managed Care – PPO | Admitting: Orthopaedic Surgery

## 2021-04-19 ENCOUNTER — Encounter: Payer: Self-pay | Admitting: Orthopaedic Surgery

## 2021-04-19 ENCOUNTER — Other Ambulatory Visit: Payer: Self-pay

## 2021-04-19 DIAGNOSIS — M19011 Primary osteoarthritis, right shoulder: Secondary | ICD-10-CM

## 2021-04-19 DIAGNOSIS — M1712 Unilateral primary osteoarthritis, left knee: Secondary | ICD-10-CM | POA: Diagnosis not present

## 2021-04-19 NOTE — Progress Notes (Signed)
Subjective: Patient is here for ultrasound-guided intra-articular right glenohumeral injection.  Prior injection helped until recently.   Objective:  Pain and decreased ROM.  Procedure: Ultrasound guided injection is preferred based studies that show increased duration, increased effect, greater accuracy, decreased procedural pain, increased response rate, and decreased cost with ultrasound guided versus blind injection.   Verbal informed consent obtained.  Time-out conducted.  Noted no overlying erythema, induration, or other signs of local infection. Ultrasound-guided right glenohumeral injection: After sterile prep with Betadine, injected 4 cc 0.25% bupivocaine without epinephrine and 6 mg betamethasone using a 22-gauge spinal needle, passing the needle from posterior approach into the glenohumeral joint.  Injectate seen filling joint capsule.

## 2021-04-19 NOTE — Progress Notes (Signed)
Office Visit Note   Patient: Nathan Ballard           Date of Birth: Jul 09, 1961           MRN: [id number] Visit Date: 04/19/2021              Requested by: Jani Gravel, [address] [place],  [place] [id number] PCP: Jani Gravel, MD   Assessment & Plan: Visit Diagnoses:  1. Unilateral primary osteoarthritis, left knee   2. Primary osteoarthritis of right shoulder     Plan: Impression is advanced degenerative joint disease to the right shoulder and left knee.  We have discussed cortisone injections for both for which she would like to proceed.  We will try and limit his activity to the right shoulder while at the gym.  He is aware that he will likely need a total shoulder replacement in the future.  In regards to the left knee, we will proceed with a cortisone injection today.  Have also provided him with a viscosupplementation handout should the cortisone injections fail to relieve his symptoms.  He will follow-up with [place] as needed.  Follow-Up Instructions: No follow-ups on file.   Orders:  Orders Placed This Encounter  Procedures   Large Joint Inj: L knee   No orders of the defined types were placed in this encounter.     Procedures: Large Joint Inj: L knee on 04/19/2021 8:59 AM Indications: pain Details: 22 G needle, anterolateral approach Medications: 2 mL lidocaine 1 %; 2 mL bupivacaine 0.25 %; 40 mg methylPREDNISolone acetate 40 MG/ML     Clinical Data: No additional findings.   Subjective: Chief Complaint  Patient presents with   Right Shoulder - Follow-up   Left Knee - Follow-up    HPI is a very pleasant 60 year old gentleman who comes in today with recurrent right shoulder and left knee pain.  In regards to his right shoulder, he has a history of advanced degenerative joint disease.  He has been seen by [place] in the past where he was referred to Dr. Junius Roads for glenohumeral cortisone injection.  This was last done in November 2020.  He notes good  relief for a while, but his pain has returned and progressively worsened.  Pain is worse with any activity above the shoulder level.  He works out quite a bit, but avoids any overhead activity.  In regards to the left knee, he has a history of DJD as well as ACL reconstruction.  The pain he has is primarily to the medial and lateral aspects and is worse with activity.  He denies any mechanical symptoms or instability.  Previous cortisone injection in October of last year helped for about 3 months.  He would like repeat cortisone injection today if possible.  Review of Systems as detailed in HPI.  All others reviewed and are negative.   Objective: Vital Signs: There were no vitals taken for this visit.  Physical Exam well-developed well-nourished gentleman in no acute distress.  Alert and oriented x3.  Ortho Exam right shoulder exam shows forward flexion to about 90 degrees.  External rotation and internal rotation to approximately 45 degrees.  Positive empty can test.  4 out of 5 strength throughout.  He is neurovascular intact distally.  Left knee exam shows no effusion.  Moderate patellofemoral crepitus.  Range of motion 0 to 115 degrees.  No joint line tenderness either side.  He is neurovascular intact distally.  Specialty Comments:  No  specialty comments available.  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   Hypomagnesemia 03/02/2016   Acute respiratory failure with hypoxia 03/02/2016   Abnormal liver function 02/29/2016   Major depressive disorder 02/29/2016   Hyperlipidemia    Generalized anxiety disorder    Arthritis of carpometacarpal (CMC)  joint of left thumb    Essential hypertension    Leukocytosis    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 (asymptomatic)   Arthritis of carpometacarpal (CMC) joint of left thumb    BPH with obstruction/lower urinary tract symptoms 02/24/2019   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   seizure free since 2017   Diverticulosis of colon    Essential hypertension    Generalized anxiety disorder    History of adenomatous polyp of colon 09/03/2012   tubual adenoma and hyperplastic   History of basal cell carcinoma excision 08/21/2014   scalp; also left lateral neck (02/15/2015)   History of COVID-19 06/2020   Hyperlipidemia    Hypomagnesemia 03/02/2016   Leukocytosis    Major depressive disorder 02/29/2016   Mild neurocognitive disorder, unclear etiology 09/27/2020   OA (osteoarthritis)    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   Subcutaneous cyst    lower abdomen and right forearm    Family History  Problem Relation Age of Onset   Diabetes Father    Heart disease Father    Hypertension Father    Alzheimer's disease Mother        early-onset presentation; late 40s/early 77s   Alzheimer's disease Maternal Grandmother        symptom onset during late 70s/early 80s   Alzheimer's disease Maternal Aunt        symptom onset during late 70s/early 80s   Colon cancer Neg Hx    Stomach cancer Neg Hx    Colon polyps Neg Hx    Esophageal cancer Neg Hx    Rectal cancer Neg Hx     Past Surgical History:  Procedure Laterality Date   ANTERIOR CRUCIATE LIGAMENT REPAIR Left 1989; 2007   COLONOSCOPY     COLONOSCOPY WITH PROPOFOL  09/03/2012   KNEE ARTHROSCOPY WITH MEDIAL MENISECTOMY Right 09/19/2015   Procedure: RIGHT KNEE ARTHROSCOPY WITH PARTIAL MEDIAL MENISCECTOMY;  Surgeon: Leandrew Koyanagi, MD;  Location: [facility];   Service: Orthopedics;  Laterality: Right;   LIPOMA EXCISION Right 05/01/2017   Procedure: EXCISION OF CYST FROM RIGHT FOREARM AND LOWER ABDOMEN;  Surgeon: Clovis Riley, MD;  Location: [facility];  Service: General;  Laterality: Right;  site= right arm and abdomen   POLYPECTOMY     REMOVAL GREAT TOE NAIL Right 1993   SHOULDER SURGERY Right 1999   TRANSTHORACIC ECHOCARDIOGRAM  03/01/2016   GRADE 1 DIASTOLIC DYSFUNCTION/  EF 55-60%   WRIST GANGLION EXCISION Right 1998   Social History   Occupational History   Not on file  Tobacco Use   Smoking status: Never   Smokeless tobacco: Never  Substance and Sexual Activity   Alcohol use: Yes  Comment: rarely   Drug use: No   Sexual activity: Not on file

## 2021-04-20 MED ORDER — LIDOCAINE HCL 1 % IJ SOLN
2.0000 mL | INTRAMUSCULAR | Status: AC | PRN
  Administered 2021-04-19: 2 mL

## 2021-04-20 MED ORDER — BUPIVACAINE HCL 0.25 % IJ SOLN
2.0000 mL | INTRAMUSCULAR | Status: AC | PRN
  Administered 2021-04-19: 2 mL via INTRA_ARTICULAR

## 2021-04-20 MED ORDER — METHYLPREDNISOLONE ACETATE 40 MG/ML IJ SUSP
40.0000 mg | INTRAMUSCULAR | Status: AC | PRN
  Administered 2021-04-19: 40 mg via INTRA_ARTICULAR

## 2021-04-23 NOTE — Progress Notes (Signed)
NEUROLOGY FOLLOW UP OFFICE NOTE  [name] [id number]  Assessment/Plan:   Mild neurocognitive disorder    Due to family history and possibility that this may indicate pre-cursor to Alzheimer's, he is taking donepezil 10mg  at bedtime Lifestyle modification:  regular exercise (cardio and resistance training), cognitively-stimulating activities, Mediterranean diet, proper sleep hygiene Follow up in one year.  Subjective:  Nathan Ballard is a 60 year old right-handed man with hypertension and hyperlipidemia who follows up for memory deficits  He is accompanied by his wife who provides some history as well.   UPDATE: Taking donepezil 10mg  QHS. Overall doing okay.  Sometimes remembering all of the things that he needs to do for work.     HISTORY: His family has noticed memory problems since June 2021, after he lost his job related to the COVID-19 pandemic.  Sometimes when he is asked a question, he has a blank stare.  He has had trouble getting to places when driving on familiar routes but no trouble finding his way home.  No difficulty managing finances.  BCIT score was 2 (had trouble with name and address).  He has since been trying to find another job which has been frustrating.  He reports that his mother was diagnosed with Alzheimer's dementia in her late 110s-early 73s.  He underwent workup for memory loss.  Labs from October 2021 showed B12 698 and TSH 1.64.  MRI of brain with and without contrast on 09/17/2020 showed stable right frontal developmental venous anomaly but otherwise unremarkable.  Neuropsychological evaluation on 09/27/2020 demonstrated mild neurocognitive disorder with impairment across encoding, retrieval, and consolidation aspects of verbal memory.  Etiology unclear.  Despite weakness in verbal memory, he did not display a pattern of performance consistent with early-onset Alzheimer's disease but could not rule out.   In 2021, he reports a new severe paroxysmal  sharp pain in right temple lasting 5 to 8 seconds.  It has only occurred twice.   I saw him back in 2017 for spells with hypoxia and hypercapnia  He has had recurrent episodes in which he is unable to be woken up from a deep sleep with labored breathing.  It would last 10 to 15 minutes.  After he wakes up, he is either lucid or somewhat confused.  On a couple of occasions, he was given Narcan by EMS and he awoke.  However, UDS was negative so it was thought to be coincidental.  He had several ED visits.  On 02/28/16, he was admitted to [facility] following another spell.  He was started on Keppra 500mg  twice daily.  CT of head was unremarkable.  MRI of brain personally reviewed only revealed a right frontal developmental venous anomaly (stable compared to MRI in 2008), but no acute process.  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  He became unresponsive in the hospital and had an O2 sat of 19% and was making gurgling noises.  He was intubated for airway protection.  He was hypotensive but did not lose pulse. Echocardiogram was unremarkable and showed EF 55-60% with grade 1 diastolic dysfunction.  Routine EEG and overnight EEG were normal.  He was discharged on Keppra 1500mg  twice daily.  Acute hypoxic and hypercapnic respiratory failure was felt to be secondary to seizure.  He returned to the ED on 03/22/16 after another spell.  He was started on  Depakote.  Later that evening, he was standing in the kitchen and developed a glassy look in his eyes.  He was slow to respond.  He was sitting and awake but started to shake all over, followed by loss of consciousness.  Otherwise, he does not demonstrate any convulsions.  His wife drove him to nearby fire station for vitals, where his O2 sat was 68% on room air and 86% on O2.  He returned to the ED. Blood pressure was in the  90s/60s and pulse in the 40s.  He was given a loading dose of IV Depacon.   He continued to have spells in which he seems a little confused.  He does not demonstrate tongue biting or urinary/bowel incontinence.  Blood sugar has always been okay.  He was readmitted to the hospital on 03/29/16.  Long-term EEG was unremarkable.  Keppra was discontinued and he was started on gabapentin 300mg  three times daily.  He remained on Depakote.  72 hour EEG from 04/09/16 to 04/11/16 was normal but did not capture his habitual spells.  He had another spell on 04/14/16.  He was seen by pulmonology and cardiology, both did not suspect primary pulmonary or cardiac etiology.  Since then, he has been doing well.  He had taken himself off Depakote and gabapentin, as well as Cymbalta.  He has not had any other spells.  He has not had any apneic episodes during sleep.  He feels better.  His memory is better.  He was referred to the EMU at [facility] for further evaluation, as well as a sleep study, but he cancelled both since he had been feeling well.  Unclear if spells were central apneic.  He had a similar episode of confusion in 2008.  MRI of brain with and without contrast on 11/10/07 demonstrated the incidental developmental venous anomaly.  Episodes subsequently resolved and hasn't had another spell since 2017.  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   seizure free since 2017   Diverticulosis of colon    Essential hypertension    Generalized anxiety disorder    History of adenomatous polyp of colon 09/03/2012   tubual adenoma and hyperplastic   History of  basal cell carcinoma excision 08/21/2014   scalp; also left lateral neck (02/15/2015)   History of COVID-19 06/2020   Hyperlipidemia    Hypomagnesemia 03/02/2016   Leukocytosis    Major depressive disorder 02/29/2016   Mild neurocognitive disorder, unclear etiology 09/27/2020   OA (osteoarthritis)    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   Subcutaneous cyst    lower abdomen and right forearm    MEDICATIONS: Current Outpatient Medications on File Prior to Visit  Medication Sig Dispense Refill   ALPRAZolam (XANAX) 0.25 MG tablet      Apoaequorin (PREVAGEN) 10 MG CAPS Take 1 capsule by mouth daily. (Patient not taking: Reported on 08/28/2020)     atorvastatin (LIPITOR) 40 MG tablet Take 20 mg by mouth every morning. 1/2 tablet of 40 mg     Biotin 1000 MCG tablet Take 1,000 mcg by mouth daily.     Cholecalciferol (VITAMIN D-3) 1000 units CAPS Take 1 capsule by mouth every  morning.     donepezil (ARICEPT) 10 MG tablet Take 1 tablet (10 mg total) by mouth at bedtime. 30 tablet 5   Misc Natural Products (OSTEO BI-FLEX JOINT SHIELD) TABS Take 2 tablets by mouth daily.     Multiple Vitamin (MULTIVITAMIN) tablet Take 1 tablet by mouth daily.     nebivolol (BYSTOLIC) 10 MG tablet Take 10 mg by mouth every morning.      tiZANidine (ZANAFLEX) 4 MG tablet Take 1 tablet (4 mg total) by mouth every 6 (six) hours as needed for muscle spasms. (Patient not taking: Reported on 08/28/2020) 30 tablet 2   zinc sulfate 220 (50 Zn) MG capsule Take 1 capsule (220 mg total) by mouth daily. 42 capsule 0   Current Facility-Administered Medications on File Prior to Visit  Medication Dose Route Frequency Provider Last Rate Last Admin   0.9 %  sodium chloride infusion  500 mL Intravenous Continuous Pyrtle, Lajuan Lines, MD        ALLERGIES: Allergies  Allergen Reactions   Morphine And Related Nausea And Vomiting    FAMILY HISTORY: Family History  Problem Relation Age of Onset   Diabetes Father    Heart disease Father     Hypertension Father    Alzheimer's disease Mother        early-onset presentation; late 40s/early 14s   Alzheimer's disease Maternal Grandmother        symptom onset during late 70s/early 80s   Alzheimer's disease Maternal Aunt        symptom onset during late 70s/early 80s   Colon cancer Neg Hx    Stomach cancer Neg Hx    Colon polyps Neg Hx    Esophageal cancer Neg Hx    Rectal cancer Neg Hx       Objective:  Blood pressure 124/69, pulse 61, height 6' (1.829 m), weight 208 lb 9.6 oz (94.6 kg), SpO2 97 %. General: No acute distress.  Patient appears well-groomed.   Head:  Normocephalic/atraumatic Eyes:  Fundi examined but not visualized Neck: supple, no paraspinal tenderness, full range of motion Heart:  Regular rate and rhythm Lungs:  Clear to auscultation bilaterally Back: No paraspinal tenderness Neurological Exam:  [employer] Mental Exam 08/28/2020 04/25/2021  Weekday Correct 1 1  Current year 1 1  What state are we in? 1 1  Amount spent 1 0  Amount left 2 2  # of Animals 3 2  5  objects recall 0 1  Number series 1 1  Hour markers 2 0  Time correct 2 2  Placed X in triangle correctly 1 1  Largest Figure 1 1  Name of male 2 2  Date back to work 0 2  Type of work 2 2  State she lived in 2 2  Total score 22 21   Speech fluent and not dysarthric, language intact.  CN II-XII intact. Bulk and tone normal, muscle strength 5/5 throughout.  Sensation to light touch intact.  Deep tendon reflexes 2+ throughout.  Finger to nose testing intact.  Gait normal, Romberg negative.   Metta Clines, DO  CC: Jani Gravel, MD

## 2021-04-25 ENCOUNTER — Other Ambulatory Visit: Payer: Self-pay

## 2021-04-25 ENCOUNTER — Ambulatory Visit (INDEPENDENT_AMBULATORY_CARE_PROVIDER_SITE_OTHER): Payer: BC Managed Care – PPO | Admitting: Neurology

## 2021-04-25 ENCOUNTER — Encounter: Payer: Self-pay | Admitting: Neurology

## 2021-04-25 VITALS — BP 124/69 | HR 61 | Ht 72.0 in | Wt 208.6 lb

## 2021-04-25 DIAGNOSIS — G3184 Mild cognitive impairment, so stated: Secondary | ICD-10-CM

## 2021-04-25 NOTE — Patient Instructions (Addendum)
Continue donepezil 10mg  at bedtime.  As recommended by Dr. Melvyn Novas, lifestyle modification is important for brain health (e.g., regular physical exercise, good nutrition habits, regular participation in cognitively-stimulating activities, and general stress management techniques), which are likely to have benefits for both emotional adjustment and cognition. In fact, in addition to promoting good general health, regular exercise incorporating aerobic activities (e.g., brisk walking, jogging, cycling, etc.) has been demonstrated to be a very effective treatment for depression and stress, with similar efficacy rates to both antidepressant medication and psychotherapy. Optimal control of vascular risk factors (including safe cardiovascular exercise and adherence to dietary recommendations) is encouraged.  Proper sleep is important.  I would follow a Mediterranean diet (see below).   If interested, there are some activities which have therapeutic value and can be useful in keeping him cognitively stimulated. For suggestions, Mr. Nathan Ballard is encouraged to go to the following website: https://www.[facility].org/get-to-know-barrow/centers-programs/neurorehabilitation-center/neuro-rehab-apps-and-games/ which has options, categorized by level of difficulty. It should be noted that these activities should not be viewed as a substitute for therapy.   Memory can be improved using internal strategies such as rehearsal, repetition, chunking, mnemonics, association, and imagery. External strategies such as written notes in a consistently used memory journal, visual and nonverbal auditory cues such as a calendar on the refrigerator or appointments with alarm, such as on a cell phone, can also help maximize recall.     He should continue to routinely use memory compensatory techniques. It is also recommended that he utilize multiple strategies when learning new information, such as repeating it, writing it down, and putting it  into his own words to promote encoding through multi-modal learning.   To address problems with fluctuating attention, he may wish to consider:   -Avoiding external distractions when needing to concentrate   -Limiting exposure to fast paced environments with multiple sensory demands   -Writing down complicated information and using checklists   -Attempting and completing one task at a time (i.e., no multi-tasking)   -Verbalizing aloud each step of a task to maintain focus   -Reducing the amount of information considered at one time  Mediterranean Diet A Mediterranean diet refers to food and lifestyle choices that are based on the traditions of countries located on the [employer]. This way of eating has been shown to help prevent certain conditions and improve outcomes forpeople who have chronic diseases, like kidney disease and heart disease. What are tips for following this plan? Lifestyle Cook and eat meals together with your family, when possible. Drink enough fluid to keep your urine clear or pale yellow. Be physically active every day. This includes: Aerobic exercise like running or swimming. Leisure activities like gardening, walking, or housework. Get 7-8 hours of sleep each night. If recommended by your health care provider, drink red wine in moderation. This means 1 glass a day for nonpregnant women and 2 glasses a day for men. A glass of wine equals 5 oz (150 mL). Reading food labels  Check the serving size of packaged foods. For foods such as rice and pasta, the serving size refers to the amount of cooked product, not dry. Check the total fat in packaged foods. Avoid foods that have saturated fat or trans fats. Check the ingredients list for added sugars, such as corn syrup.  Shopping At the grocery store, buy most of your food from the areas near the walls of the store. This includes: Fresh fruits and vegetables (produce). Grains, beans, nuts, and seeds. Some of these  may  be available in unpackaged forms or large amounts (in bulk). Fresh seafood. Poultry and eggs. Low-fat dairy products. Buy whole ingredients instead of prepackaged foods. Buy fresh fruits and vegetables in-season from local farmers markets. Buy frozen fruits and vegetables in resealable bags. If you do not have access to quality fresh seafood, buy precooked frozen shrimp or canned fish, such as tuna, salmon, or sardines. Buy small amounts of raw or cooked vegetables, salads, or olives from the deli or salad bar at your store. Stock your pantry so you always have certain foods on hand, such as olive oil, canned tuna, canned tomatoes, rice, pasta, and beans. Cooking Cook foods with extra-virgin olive oil instead of using butter or other vegetable oils. Have meat as a side dish, and have vegetables or grains as your main dish. This means having meat in small portions or adding small amounts of meat to foods like pasta or stew. Use beans or vegetables instead of meat in common dishes like chili or lasagna. Experiment with different cooking methods. Try roasting or broiling vegetables instead of steaming or sauteing them. Add frozen vegetables to soups, stews, pasta, or rice. Add nuts or seeds for added healthy fat at each meal. You can add these to yogurt, salads, or vegetable dishes. Marinate fish or vegetables using olive oil, lemon juice, garlic, and fresh herbs. Meal planning  Plan to eat 1 vegetarian meal one day each week. Try to work up to 2 vegetarian meals, if possible. Eat seafood 2 or more times a week. Have healthy snacks readily available, such as: Vegetable sticks with hummus. Greek yogurt. Fruit and nut trail mix. Eat balanced meals throughout the week. This includes: Fruit: 2-3 servings a day Vegetables: 4-5 servings a day Low-fat dairy: 2 servings a day Fish, poultry, or lean meat: 1 serving a day Beans and legumes: 2 or more servings a week Nuts and seeds: 1-2  servings a day Whole grains: 6-8 servings a day Extra-virgin olive oil: 3-4 servings a day Limit red meat and sweets to only a few servings a month  What are my food choices? Mediterranean diet Recommended Grains: Whole-grain pasta. Brown rice. Bulgar wheat. Polenta. Couscous. Whole-wheat bread. [place]. Vegetables: Artichokes. Beets. Broccoli. Cabbage. Carrots. Eggplant. Green beans. Chard. Kale. Spinach. Onions. Leeks. Peas. Squash. Tomatoes. Peppers. Radishes. Fruits: Apples. Apricots. Avocado. Berries. Bananas. Cherries. Dates. Figs. Grapes. Lemons. Melon. Oranges. Peaches. Plums. Pomegranate. Meats and other protein foods: Beans. Almonds. Sunflower seeds. Pine nuts. Peanuts. [place]. Salmon. Scallops. Shrimp. [place]. Tilapia. Clams. Oysters. Eggs. Dairy: Low-fat milk. Cheese. Greek yogurt. Beverages: Water. Red wine. Herbal tea. Fats and oils: Extra virgin olive oil. Avocado oil. Grape seed oil. Sweets and desserts: [place] yogurt with honey. Baked apples. Poached pears. Trail mix. Seasoning and other foods: Basil. Cilantro. Coriander. Cumin. Mint. Parsley. Sage. Rosemary. Tarragon. Garlic. Oregano. Thyme. Pepper. Balsalmic vinegar. Tahini. Hummus. Tomato sauce. Olives. Mushrooms. Limit these Grains: Prepackaged pasta or rice dishes. Prepackaged cereal with added sugar. Vegetables: Deep fried potatoes (french fries). Fruits: Fruit canned in syrup. Meats and other protein foods: Beef. Pork. Lamb. Poultry with skin. Hot dogs. Berniece Salines. Dairy: Ice cream. Sour cream. Whole milk. Beverages: Juice. Sugar-sweetened soft drinks. Beer. Liquor and spirits. Fats and oils: Butter. Canola oil. Vegetable oil. Beef fat (tallow). Lard. Sweets and desserts: Cookies. Cakes. Pies. Candy. Seasoning and other foods: Mayonnaise. Premade sauces and marinades. The items listed may not be a complete list. Talk with your dietitian aboutwhat dietary choices are right for you. Summary The [place]  diet  includes both food and lifestyle choices. Eat a variety of fresh fruits and vegetables, beans, nuts, seeds, and whole grains. Limit the amount of red meat and sweets that you eat. Talk with your health care provider about whether it is safe for you to drink red wine in moderation. This means 1 glass a day for nonpregnant women and 2 glasses a day for men. A glass of wine equals 5 oz (150 mL). This information is not intended to replace advice given to you by your health care provider. Make sure you discuss any questions you have with your healthcare provider. Document Revised: 06/26/2016 Document Reviewed: 06/19/2016 Elsevier Patient Education  [place].

## 2021-05-31 ENCOUNTER — Other Ambulatory Visit: Payer: Self-pay | Admitting: Neurology

## 2021-11-06 ENCOUNTER — Encounter: Payer: Self-pay | Admitting: Cardiology

## 2021-11-06 ENCOUNTER — Ambulatory Visit
Admission: EM | Admit: 2021-11-06 | Discharge: 2021-11-06 | Disposition: A | Discharge status: Home / Self Care | Payer: BC Managed Care – PPO | Attending: Internal Medicine | Admitting: Internal Medicine

## 2021-11-06 ENCOUNTER — Emergency Department (HOSPITAL_COMMUNITY): Payer: BC Managed Care – PPO

## 2021-11-06 ENCOUNTER — Encounter (HOSPITAL_COMMUNITY): Payer: Self-pay | Admitting: Emergency Medicine

## 2021-11-06 ENCOUNTER — Encounter: Payer: Self-pay | Admitting: Emergency Medicine

## 2021-11-06 ENCOUNTER — Emergency Department (HOSPITAL_COMMUNITY)
Admission: EM | Admit: 2021-11-06 | Discharge: 2021-11-06 | Disposition: A | Discharge status: Home / Self Care | Payer: BC Managed Care – PPO | Attending: Emergency Medicine | Admitting: Emergency Medicine

## 2021-11-06 ENCOUNTER — Other Ambulatory Visit: Payer: Self-pay

## 2021-11-06 DIAGNOSIS — R000 Tachycardia, unspecified: Secondary | ICD-10-CM | POA: Diagnosis not present

## 2021-11-06 DIAGNOSIS — R002 Palpitations: Secondary | ICD-10-CM | POA: Diagnosis not present

## 2021-11-06 DIAGNOSIS — I499 Cardiac arrhythmia, unspecified: Secondary | ICD-10-CM | POA: Diagnosis not present

## 2021-11-06 DIAGNOSIS — I10 Essential (primary) hypertension: Secondary | ICD-10-CM | POA: Insufficient documentation

## 2021-11-06 DIAGNOSIS — Z79899 Other long term (current) drug therapy: Secondary | ICD-10-CM | POA: Diagnosis not present

## 2021-11-06 DIAGNOSIS — Z85828 Personal history of other malignant neoplasm of skin: Secondary | ICD-10-CM | POA: Insufficient documentation

## 2021-11-06 DIAGNOSIS — I4891 Unspecified atrial fibrillation: Secondary | ICD-10-CM | POA: Diagnosis not present

## 2021-11-06 DIAGNOSIS — E7800 Pure hypercholesterolemia, unspecified: Secondary | ICD-10-CM | POA: Diagnosis not present

## 2021-11-06 DIAGNOSIS — Z8616 Personal history of COVID-19: Secondary | ICD-10-CM | POA: Insufficient documentation

## 2021-11-06 LAB — COMPREHENSIVE METABOLIC PANEL
ALT: 39 U/L (ref 0–44)
AST: 33 U/L (ref 15–41)
Albumin: 3.9 g/dL (ref 3.5–5.0)
Alkaline Phosphatase: 53 U/L (ref 38–126)
Anion gap: 9 (ref 5–15)
BUN: 19 mg/dL (ref 6–20)
CO2: 24 mmol/L (ref 22–32)
Calcium: 8.9 mg/dL (ref 8.9–10.3)
Chloride: 105 mmol/L (ref 98–111)
Creatinine, Ser: 0.98 mg/dL (ref 0.61–1.24)
GFR, Estimated: >60 mL/min (ref >60)
Glucose, Bld: 112 mg/dL — ABNORMAL HIGH (ref 70–99)
Potassium: 3.8 mmol/L (ref 3.5–5.1)
Sodium: 138 mmol/L (ref 135–145)
Total Bilirubin: 0.8 mg/dL (ref 0.3–1.2)
Total Protein: 6.3 g/dL — ABNORMAL LOW (ref 6.5–8.1)

## 2021-11-06 LAB — CBC WITH DIFFERENTIAL/PLATELET
Abs Immature Granulocytes: 0.04 10*3/uL (ref 0.00–0.07)
Basophils Absolute: 0 10*3/uL (ref 0.0–0.1)
Basophils Relative: 0 %
Eosinophils Absolute: 0.1 10*3/uL (ref 0.0–0.5)
Eosinophils Relative: 2 %
HCT: 42.3 % (ref 39.0–52.0)
Hemoglobin: 14.8 g/dL (ref 13.0–17.0)
Immature Granulocytes: 1 %
Lymphocytes Relative: 31 %
Lymphs Abs: 1.6 10*3/uL (ref 0.7–4.0)
MCH: 33.5 pg (ref 26.0–34.0)
MCHC: 35 g/dL (ref 30.0–36.0)
MCV: 95.7 fL (ref 80.0–100.0)
Monocytes Absolute: 0.5 10*3/uL (ref 0.1–1.0)
Monocytes Relative: 10 %
Neutro Abs: 2.8 10*3/uL (ref 1.7–7.7)
Neutrophils Relative %: 56 %
Platelets: 177 10*3/uL (ref 150–400)
RBC: 4.42 MIL/uL (ref 4.22–5.81)
RDW: 12 % (ref 11.5–15.5)
WBC: 5 10*3/uL (ref 4.0–10.5)
nRBC: 0 % (ref 0.0–0.2)

## 2021-11-06 LAB — TROPONIN I (HIGH SENSITIVITY): Troponin I (High Sensitivity): 15 ng/L (ref <18)

## 2021-11-06 LAB — D-DIMER, QUANTITATIVE: D-Dimer, Quant: <0.27 ug/mL-FEU (ref 0.00–0.50)

## 2021-11-06 LAB — MAGNESIUM: Magnesium: 1.9 mg/dL (ref 1.7–2.4)

## 2021-11-06 MED ORDER — ETOMIDATE 2 MG/ML IV SOLN
10.0000 mg | Freq: Once | INTRAVENOUS | Status: AC
  Administered 2021-11-06: 19:00:00 10 mg via INTRAVENOUS
  Filled 2021-11-06: qty 10

## 2021-11-06 MED ORDER — APIXABAN 5 MG PO TABS
5.0000 mg | ORAL_TABLET | ORAL | Status: AC
  Administered 2021-11-06: 20:00:00 5 mg via ORAL
  Filled 2021-11-06: qty 1

## 2021-11-06 MED ORDER — DILTIAZEM HCL ER COATED BEADS 120 MG PO CP24: 120.0000 mg | ORAL_CAPSULE | Freq: Every day | ORAL | 0 refills | Status: DC

## 2021-11-06 MED ORDER — DILTIAZEM HCL-DEXTROSE 125-5 MG/125ML-% IV SOLN (PREMIX)
5.0000 mg/h | INTRAVENOUS | Status: DC
  Administered 2021-11-06: 18:00:00 5 mg/h via INTRAVENOUS
  Filled 2021-11-06: qty 125

## 2021-11-06 MED ORDER — APIXABAN 5 MG PO TABS: 5.0000 mg | ORAL_TABLET | Freq: Two times a day (BID) | ORAL | 0 refills | Status: DC

## 2021-11-06 MED ORDER — DILTIAZEM HCL ER COATED BEADS 120 MG PO CP24
120.0000 mg | ORAL_CAPSULE | Freq: Once | ORAL | Status: AC
  Administered 2021-11-06: 17:00:00 120 mg via ORAL
  Filled 2021-11-06: qty 1

## 2021-11-06 MED ORDER — SODIUM CHLORIDE 0.9 % IV BOLUS
1000.0000 mL | Freq: Once | INTRAVENOUS | Status: AC
  Administered 2021-11-06: 17:00:00 1000 mL via INTRAVENOUS

## 2021-11-06 MED ORDER — ETOMIDATE 2 MG/ML IV SOLN
INTRAVENOUS | Status: AC | PRN
  Administered 2021-11-06: 10 mg via INTRAVENOUS

## 2021-11-06 NOTE — ED Triage Notes (Signed)
Reports feeling his heart skipping beats, beating harder than normal after lunch around 2 pm today. Denies chest pain, shortness of breath. Reports it being difficult to catch his breath at times. Appears anxious in triage

## 2021-11-06 NOTE — ED Notes (Signed)
Patient verbalizes understanding of d/c instructions. Opportunities for questions and answers were provided. Pt d/c from ED and ambulated to lobby with wife.  

## 2021-11-06 NOTE — Discharge Instructions (Signed)
You have new onset atrial fibrillation.  You were cardioverted back to normal sinus rhythm.  Please take Cardizem CD daily  Please take Eliquis twice daily to prevent blood clot  Please see Dr. [name] in the office as scheduled  Return to ER if you have worse palpitations, chest pain, shortness of breath

## 2021-11-06 NOTE — H&P (Signed)
CARDIOLOGY ADMIT NOTE   Patient ID: Nathan Ballard MRN: [id number] DOB/AGE: 1961-03-21 60 y.o.  Admit date: 11/06/2021 Primary Physician:  [name], DO  Patient ID: Nathan Ballard, male    DOB: 1961-01-19, 60 y.o.   MRN: [id number]  Chief Complaint  Patient presents with   Atrial Fibrillation    Dizziness   HPI:    Nathan Ballard  is a 60 y.o. Caucasian white male with no significant prior cardiovascular history except for hypertension and hyperlipidemia which is well managed, presented to the emergency room via an urgent care when he presented with rapid palpitations.  He was found to be in new onset atrial fibrillation.  In the emergency room, his blood pressure was soft, he was started on Cardizem drip and consultation was obtained by ED physician Dr. Shirlyn Goltz.  Patient denies any chest pain or dyspnea, states that he feels very anxious and nervous and does not like the feeling of heart beating fast.  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   seizure free since 2017   Diverticulosis of colon    Essential hypertension    Generalized anxiety disorder    History of adenomatous polyp of colon 09/03/2012   tubual adenoma and hyperplastic   History of basal cell carcinoma excision 08/21/2014   scalp; also left lateral neck (02/15/2015)   History of COVID-19 06/2020   Hyperlipidemia    Hypomagnesemia 03/02/2016   Leukocytosis    Major depressive disorder 02/29/2016   Mild neurocognitive disorder, unclear etiology 09/27/2020   OA (osteoarthritis)    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   Subcutaneous cyst    lower abdomen and  right forearm   Past Surgical History:  Procedure Laterality Date   ANTERIOR CRUCIATE LIGAMENT REPAIR Left 1989; 2007   COLONOSCOPY     COLONOSCOPY WITH PROPOFOL  09/03/2012   KNEE ARTHROSCOPY WITH MEDIAL MENISECTOMY Right 09/19/2015   Procedure: RIGHT KNEE ARTHROSCOPY WITH PARTIAL MEDIAL MENISCECTOMY;  Surgeon: Leandrew Koyanagi, MD;  Location: [place];  Service: Orthopedics;  Laterality: Right;   LIPOMA EXCISION Right 05/01/2017   Procedure: EXCISION OF CYST FROM RIGHT FOREARM AND LOWER ABDOMEN;  Surgeon: Clovis Riley, MD;  Location: [place];  Service: General;  Laterality: Right;  site= right arm and abdomen   POLYPECTOMY     REMOVAL GREAT TOE NAIL Right 1993   SHOULDER SURGERY Right 1999   TRANSTHORACIC ECHOCARDIOGRAM  03/01/2016   GRADE 1 DIASTOLIC DYSFUNCTION/  EF 55-60%   WRIST GANGLION EXCISION Right 1998   Social History   Socioeconomic History   Marital status: Married    Spouse name: Not on file   Number of children: Not on file   Years of education: 14   Highest education level: Some college, no degree  Occupational History   Not on file  Tobacco Use   Smoking status: Never   Smokeless tobacco: Never  Substance and Sexual Activity   Alcohol use: Yes    Comment: rarely   Drug use: No   Sexual activity: Not on file  Other Topics Concern   Not on file  Social History Narrative   Right  handed   Lives in one story home with wife   Social Determinants of Health   Financial Resource Strain: Not on file  Food Insecurity: Not on file  Transportation Needs: Not on file  Physical Activity: Not on file  Stress: Not on file  Social Connections: Not on file  Intimate Partner Violence: Not on file   Family History  Problem Relation Age of Onset   Diabetes Father    Heart disease Father    Hypertension Father    Alzheimer's disease Mother        early-onset presentation; late 40s/early 57s   Alzheimer's disease Maternal  Grandmother        symptom onset during late 70s/early 36s   Alzheimer's disease Maternal Aunt        symptom onset during late 70s/early 48s   Colon cancer Neg Hx    Stomach cancer Neg Hx    Colon polyps Neg Hx    Esophageal cancer Neg Hx    Rectal cancer Neg Hx     ROS  ROS Objective   Vitals with BMI 11/06/2021 11/06/2021 11/06/2021  Height - - -  Weight - - -  BMI - - -  Systolic - - 938  Diastolic - - 94  Pulse 101 141 77    Physical Exam Laboratory examination:   Recent Labs    11/06/21 1621  NA 138  K 3.8  CL 105  CO2 24  GLUCOSE 112*  BUN 19  CREATININE 0.98  CALCIUM 8.9  GFRNONAA >60   estimated creatinine clearance is 96 mL/min (by C-G formula based on SCr of 0.98 mg/dL).  CMP Latest Ref Rng & Units 11/06/2021 05/01/2017 03/31/2016  Glucose 70 - 99 mg/dL 112(H) 94 94  BUN 6 - 20 mg/dL 19 - 13  Creatinine 0.61 - 1.24 mg/dL 0.98 - 0.75  Sodium 135 - 145 mmol/L 138 143 140  Potassium 3.5 - 5.1 mmol/L 3.8 4.0 4.0  Chloride 98 - 111 mmol/L 105 - 106  CO2 22 - 32 mmol/L 24 - 28  Calcium 8.9 - 10.3 mg/dL 8.9 - 8.9  Total Protein 6.5 - 8.1 g/dL 6.3(L) - 5.7(L)  Total Bilirubin 0.3 - 1.2 mg/dL 0.8 - 0.5  Alkaline Phos 38 - 126 U/L 53 - 43  AST 15 - 41 U/L 33 - 18  ALT 0 - 44 U/L 39 - 15(L)   CBC Latest Ref Rng & Units 11/06/2021 05/01/2017 03/31/2016  WBC 4.0 - 10.5 K/uL 5.0 - 5.1  Hemoglobin 13.0 - 17.0 g/dL 14.8 13.3 13.3  Hematocrit 39.0 - 52.0 % 42.3 39.0 40.4  Platelets 150 - 400 K/uL 177 - 192   Lipid Panel     Component Value Date/Time   CHOL 121 02/29/2016 0129   TRIG 45 03/01/2016 0347   HDL 50 02/29/2016 0129   CHOLHDL 2.4 02/29/2016 0129   VLDL 7 02/29/2016 0129   LDLCALC 64 02/29/2016 0129   HEMOGLOBIN A1C No results found for: HGBA1C, MPG TSH No results for input(s): TSH in the last 8760 hours. BNP (last 3 results) No results for input(s): BNP in the last 8760 hours. Cardiac Panel (last 3 results) Recent Labs    11/06/21 1621   TROPONINIHS 15     Medications and allergies   Allergies  Allergen Reactions   Morphine And Related Nausea And Vomiting     Prior to Admission medications   Medication Sig Start Date End Date Taking? Authorizing Provider  acetaminophen (TYLENOL)  500 MG tablet Take 500 mg by mouth every 6 (six) hours as needed for headache or mild pain.   Yes [provider]  atorvastatin (LIPITOR) 40 MG tablet Take 20 mg by mouth every morning. 1/2 tablet of 40 mg 08/18/12  Yes [provider]  Cholecalciferol (VITAMIN D-3) 1000 units CAPS Take 1 capsule by mouth every morning.   Yes [provider]  donepezil (ARICEPT) 10 MG tablet TAKE 1 TABLET BY MOUTH AT BEDTIME Patient taking differently: Take 10 mg by mouth at bedtime. 05/31/21  Yes Jaffe, Adam R, DO  finasteride (PROSCAR) 5 MG tablet Take 5 mg by mouth daily. 09/23/21  Yes [provider]  Multiple Vitamin (MULTIVITAMIN) tablet Take 1 tablet by mouth daily.   Yes [provider]  nebivolol (BYSTOLIC) 10 MG tablet Take 10 mg by mouth every morning.    Yes [provider]  oxybutynin (DITROPAN XL) 15 MG 24 hr tablet Take 15 mg by mouth daily. 11/05/21  Yes [provider]  saw palmetto 160 MG capsule Take 160 mg by mouth daily.   Yes [provider]  tamsulosin (FLOMAX) 0.4 MG CAPS capsule Take 0.8 mg by mouth at bedtime. 11/05/21  Yes [provider]  tiZANidine (ZANAFLEX) 4 MG tablet Take 1 tablet (4 mg total) by mouth every 6 (six) hours as needed for muscle spasms. Patient not taking: Reported on 08/28/2020 12/22/17   Leandrew Koyanagi, MD  zinc sulfate 220 (50 Zn) MG capsule Take 1 capsule (220 mg total) by mouth daily. Patient not taking: Reported on 04/25/2021 04/02/18   Leandrew Koyanagi, MD     sodium chloride     diltiazem (CARDIZEM) infusion 5 mg/hr (11/06/21 1812)    Current Outpatient Medications  Medication Instructions   acetaminophen (TYLENOL) 500 mg, Oral,  Every 6 hours PRN   atorvastatin (LIPITOR) 20 mg, Oral, Every morning, 1/2 tablet of 40 mg   Cholecalciferol (VITAMIN D-3) 1000 units CAPS 1 capsule, Oral, Every morning   donepezil (ARICEPT) 10 MG tablet TAKE 1 TABLET BY MOUTH AT BEDTIME   finasteride (PROSCAR) 5 mg, Oral, Daily   Multiple Vitamin (MULTIVITAMIN) tablet 1 tablet, Oral, Daily   nebivolol (BYSTOLIC) 10 mg, Oral, Every morning   oxybutynin (DITROPAN XL) 15 mg, Oral, Daily   saw palmetto 160 mg, Oral, Daily   tamsulosin (FLOMAX) 0.8 mg, Oral, Daily at bedtime   tiZANidine (ZANAFLEX) 4 mg, Oral, Every 6 hours PRN   zinc sulfate 220 mg, Oral, Daily   Radiology:  DG Chest Port 1 View  Result Date: 11/06/2021 CLINICAL DATA:  Palpitations. EXAM: PORTABLE CHEST 1 VIEW COMPARISON:  03/30/2016 FINDINGS: Normal heart size and pulmonary vascularity. No focal airspace disease or consolidation in the lungs. No blunting of costophrenic angles. No pneumothorax. Mediastinal contours appear intact. Previous resection or resorption of the distal right clavicle. IMPRESSION: No active disease. Electronically Signed   By: Lucienne Capers M.D.   On: 11/06/2021 16:27    Cardiac Studies:   EKG 11/06/2021: Atrial fibrillation with rapid ventricular sponsor rate of: 44 bpm, normal axis, diffuse nonspecific ST-T abnormality.  Compared to 05/01/2017, normal sinus rhythm.  Assessment   1.  New onset atrial fibrillation with rapid ventricular response. CHA2DS2-VASc Score is 1.  Yearly risk of stroke: 1.3% (HTN).  Score of 1=0.6; 2=2.2; 3=3.2; 4=4.8; 5=7.2; 6=9.8; 7=>9.8) -(CHF; HTN; vasc disease DM,  Male = 1; Age <65 =0; 65-74 = 1,  >75 =2; stroke/embolism= 2).  2.  Hypertension 3.  Hyperlipidemia  External labs: Labs 08/28/2021:  Total cholesterol 144, triglycerides 66, HDL 57, LDL 74.  Non-HDL cholesterol 87.  TSH normal at 1.89.  Recommendations:   Nathan Ballard is a 60 y.o. Caucasian white male with hypertension, hyperlipidemia who  had new onset of atrial fibrillation with rapid ventricular response that started around 1 PM today.  He has no other significant cardiovascular factors, blood pressure is well controlled and lipids are also well controlled.  In view of new onset atrial fibrillation, borderline low blood pressure, proceeding with direct-current cardioversion will be very apt.  I discussed the findings of the EKG with the patient and also over the telephone with his wife, discussed regarding the risk of direct-current cardioversion including <1% risk for need for CPR or aspiration pneumonia.  They are willing to proceed.  He will need to be on diltiazem CD1 20 mg daily, will continue Bystolic upon discharge and will also start him on Eliquis 5 mg p.o. twice daily which she will be on for at least a period of 4 weeks.  I would like to arrange office visit for follow-up if cardioversion is successful.  He will probably need a sleep study in the outpatient basis.  CC: [name], MD    Adrian Prows, MD, [facility] 11/06/2021, 7:04 PM Office: [phone] Fax: [phone] Pager: [phone]

## 2021-11-06 NOTE — ED Notes (Signed)
Patient is being discharged from the Urgent Care and sent to the Emergency Department via EMS . Per Ewell Poe PA, patient is in need of higher level of care due to A.Fib w RVR. Patient is aware and verbalizes understanding of plan of care.  Vitals:   11/06/21 1442  BP: 113/79  Pulse: 83  Resp: 16  Temp: (!) 97.5 F (36.4 C)  SpO2: 96%

## 2021-11-06 NOTE — ED Triage Notes (Signed)
Patient arrived via EMS from an urgent care with complaints fluttering and dizziness. Patient states he was eating lunch and felt "funny heart beats" and felt dizzy afterward so he went to urgent care. He was diagnosed with a new onset of A-Fib.

## 2021-11-06 NOTE — CV Procedure (Addendum)
Direct current cardioversion 11/06/2021 7:04 PM  Indication symptomatic A. Fibrillation.  Procedure: Using Etomidate 20 mg for achieving deep sedation, synchronized direct current cardioversion performed. Patient was delivered with 120 Joules of electricity X 1 with success to NSR. Patient tolerated the procedure well. No immediate complication noted.   Patient will be discharged home, on diltiazem CD1 20 mg daily, he will continue with home dose of Bystolic and will be started on Eliquis 5 mg p.o. twice daily.  I will set him up to see me back in the office.  We will consider outpatient sleep study as well.  Patient is awake and alert and oriented x3 postprocedure.  Hemodynamics are stable.  Post cardioversion EKG 11/06/2021: Normal sinus rhythm without evidence of ischemia.  Patient will be discharged home, advised him to stay off of work from today that is 11/06/2021 through 11/11/2021.   Adrian Prows, MD, [facility] 11/06/2021, 7:04 PM Office: [phone] Fax: [phone] Pager: [phone]

## 2021-11-06 NOTE — ED Notes (Signed)
Cardizem requested from the pharmacy

## 2021-11-06 NOTE — ED Provider Notes (Signed)
Patient presents today for irregular heartbeat. In triage EKG with Afib with RVR. EMS here for another transfer notified. 911 called as well for additional ambulance for transport. EMS took over care while awaiting additional transport.    Francene Finders, PA-C 11/06/21 1512

## 2021-11-06 NOTE — ED Provider Notes (Addendum)
[facility] EMERGENCY DEPARTMENT Provider Note   CSN: [id number] Arrival date & time: 11/06/21  1601     History Chief Complaint  Patient presents with   Atrial Fibrillation    Dizziness    Nathan Ballard is a 60 y.o. male history of BPH, seizure, hypertension, here presenting with new onset A. fib.  Patient states that he just finished lunch and had a cute onset of palpitations around 2 PM.  Patient states that he has some shortness of breath and chest pressure as well. Patient states that he went to urgent care was noted to be in new onset A. fib.  EMS was called and he received Cardizem 10 mg and another 20 mg prior to arrival. Patient has no history of A. fib in the past.  Denies any recent travel.  Denies any CAD or stents.   The history is provided by the patient.      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   seizure free since 2017   Diverticulosis of colon    Essential hypertension    Generalized anxiety disorder    History of adenomatous polyp of colon 09/03/2012   tubual adenoma and hyperplastic   History of basal cell carcinoma excision 08/21/2014   scalp; also left lateral neck (02/15/2015)   History of COVID-19 06/2020   Hyperlipidemia    Hypomagnesemia 03/02/2016   Leukocytosis    Major depressive disorder 02/29/2016   Mild neurocognitive disorder, unclear etiology 09/27/2020   OA (osteoarthritis)    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   Subcutaneous cyst    lower abdomen and right forearm    Patient Active Problem List   Diagnosis Date Noted   Mild neurocognitive disorder, unclear etiology  09/27/2020   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   BPH with obstruction/lower urinary tract symptoms 02/24/2019   Nocturia 02/24/2019   Arteriovenous malformation of precerebral vessels    Syncope 03/30/2016   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   Hypomagnesemia 03/02/2016   Acute respiratory failure with hypoxia 03/02/2016   Abnormal liver function 02/29/2016   Major depressive disorder 02/29/2016   Hyperlipidemia    Generalized anxiety disorder    Arthritis of carpometacarpal (CMC) joint of left thumb    Essential hypertension    Leukocytosis     Past Surgical History:  Procedure Laterality Date   ANTERIOR CRUCIATE LIGAMENT REPAIR Left 1989; 2007   COLONOSCOPY     COLONOSCOPY WITH PROPOFOL  09/03/2012   KNEE ARTHROSCOPY WITH MEDIAL MENISECTOMY Right 09/19/2015   Procedure: RIGHT KNEE ARTHROSCOPY WITH PARTIAL MEDIAL MENISCECTOMY;  Surgeon: Leandrew Koyanagi, MD;  Location: [place];  Service: Orthopedics;  Laterality: Right;   LIPOMA EXCISION Right 05/01/2017   Procedure: EXCISION OF CYST FROM RIGHT FOREARM AND LOWER ABDOMEN;  Surgeon: Clovis Riley, MD;  Location: [place];  Service: General;  Laterality: Right;  site= right arm and abdomen   POLYPECTOMY     REMOVAL GREAT TOE NAIL Right [place] ECHOCARDIOGRAM  03/01/2016   GRADE 1 DIASTOLIC DYSFUNCTION/  EF 55-60%   WRIST GANGLION EXCISION Right 1998  Family History  Problem Relation Age of Onset   Diabetes Father    Heart disease Father    Hypertension Father    Alzheimer's disease Mother        early-onset presentation; late 40s/early 18s   Alzheimer's disease Maternal Grandmother        symptom onset during late 70s/early 80s   Alzheimer's disease Maternal Aunt        symptom onset during late 70s/early 80s   Colon cancer Neg Hx    Stomach cancer Neg Hx    Colon polyps Neg Hx    Esophageal cancer Neg Hx    Rectal cancer Neg Hx      Social History   Tobacco Use   Smoking status: Never   Smokeless tobacco: Never  Substance Use Topics   Alcohol use: Yes    Comment: rarely   Drug use: No    Home Medications Prior to Admission medications   Medication Sig Start Date End Date Taking? Authorizing Provider  acetaminophen (TYLENOL) 500 MG tablet Take 500 mg by mouth every 6 (six) hours as needed for headache or mild pain.   Yes [provider]  atorvastatin (LIPITOR) 40 MG tablet Take 20 mg by mouth every morning. 1/2 tablet of 40 mg 08/18/12  Yes [provider]  Cholecalciferol (VITAMIN D-3) 1000 units CAPS Take 1 capsule by mouth every morning.   Yes [provider]  donepezil (ARICEPT) 10 MG tablet TAKE 1 TABLET BY MOUTH AT BEDTIME Patient taking differently: Take 10 mg by mouth at bedtime. 05/31/21  Yes Jaffe, Adam R, DO  finasteride (PROSCAR) 5 MG tablet Take 5 mg by mouth daily. 09/23/21  Yes [provider]  Multiple Vitamin (MULTIVITAMIN) tablet Take 1 tablet by mouth daily.   Yes [provider]  nebivolol (BYSTOLIC) 10 MG tablet Take 10 mg by mouth every morning.    Yes [provider]  oxybutynin (DITROPAN XL) 15 MG 24 hr tablet Take 15 mg by mouth daily. 11/05/21  Yes [provider]  saw palmetto 160 MG capsule Take 160 mg by mouth daily.   Yes [provider]  tamsulosin (FLOMAX) 0.4 MG CAPS capsule Take 0.8 mg by mouth at bedtime. 11/05/21  Yes [provider]  tiZANidine (ZANAFLEX) 4 MG tablet Take 1 tablet (4 mg total) by mouth every 6 (six) hours as needed for muscle spasms. Patient not taking: Reported on 08/28/2020 12/22/17   Leandrew Koyanagi, MD  zinc sulfate 220 (50 Zn) MG capsule Take 1 capsule (220 mg total) by mouth daily. Patient not taking: Reported on 04/25/2021 04/02/18   Leandrew Koyanagi, MD    Allergies    Morphine and related  Review of Systems   Review of Systems  Cardiovascular:  Positive for chest pain and  palpitations.  All other systems reviewed and are negative.  Physical Exam Updated Vital Signs BP 129/79    Pulse 64    Temp 98.1 F (36.7 C) (Oral)    Resp 16    Ht 6' (1.829 m)    Wt 95.3 kg    SpO2 98%    BMI 28.48 kg/m   Physical Exam Vitals and nursing note reviewed.  Constitutional:      Appearance: Normal appearance.  HENT:     Head: Normocephalic.     Nose: Nose normal.     Mouth/Throat:     Mouth: Mucous membranes are moist.  Eyes:     Extraocular Movements: Extraocular  movements intact.     Pupils: Pupils are equal, round, and reactive to light.  Cardiovascular:     Rate and Rhythm: Tachycardia present. Rhythm irregular.     Pulses: Normal pulses.  Pulmonary:     Effort: Pulmonary effort is normal.     Breath sounds: Normal breath sounds.  Abdominal:     General: Abdomen is flat.     Palpations: Abdomen is soft.  Musculoskeletal:        General: Normal range of motion.     Cervical back: Normal range of motion and neck supple.     Comments: No calf tenderness  Skin:    General: Skin is warm.     Capillary Refill: Capillary refill takes less than 2 seconds.  Neurological:     General: No focal deficit present.     Mental Status: He is alert and oriented to person, place, and time.  Psychiatric:        Mood and Affect: Mood normal.        Behavior: Behavior normal.    ED Results / Procedures / Treatments   Labs (all labs ordered are listed, but only abnormal results are displayed) Labs Reviewed  COMPREHENSIVE METABOLIC PANEL - Abnormal; Notable for the following components:      Result Value   Glucose, Bld 112 (*)    Total Protein 6.3 (*)    All other components within normal limits  CBC WITH DIFFERENTIAL/PLATELET  MAGNESIUM  D-DIMER, QUANTITATIVE  TROPONIN I (HIGH SENSITIVITY)    EKG EKG Interpretation  Date/Time:  Wednesday November 06 2021 18:54:48 EST Ventricular Rate:  73 PR Interval:  169 QRS Duration: 92 QT Interval:  355 QTC  Calculation: 392 R Axis:   52 Text Interpretation: Sinus rhythm previous EKG showed afib Confirmed by Wandra Arthurs [phone]) on 11/06/2021 7:39:03 PM  Radiology DG Chest Port 1 View  Result Date: 11/06/2021 CLINICAL DATA:  Palpitations. EXAM: PORTABLE CHEST 1 VIEW COMPARISON:  03/30/2016 FINDINGS: Normal heart size and pulmonary vascularity. No focal airspace disease or consolidation in the lungs. No blunting of costophrenic angles. No pneumothorax. Mediastinal contours appear intact. Previous resection or resorption of the distal right clavicle. IMPRESSION: No active disease. Electronically Signed   By: Lucienne Capers M.D.   On: 11/06/2021 16:27    Procedures .Sedation  Date/Time: 11/06/2021 7:35 PM Performed by: Drenda Freeze, MD Authorized by: Drenda Freeze, MD   Consent:    Consent obtained:  Verbal and written   Consent given by:  Patient   Risks discussed:  Dysrhythmia, prolonged hypoxia resulting in organ damage and prolonged sedation necessitating reversal Universal protocol:    Immediately prior to procedure, a time out was called: yes     Patient identity confirmed:  Arm band and verbally with patient Pre-sedation assessment:    Time since last food or drink:  6 hours   ASA classification: class 1 - normal, healthy patient     Mallampati score:  I - soft palate, uvula, fauces, pillars visible   Neck mobility: normal     Pre-sedation assessments completed and reviewed: airway patency, mental status and respiratory function   Immediate pre-procedure details:    Reviewed: vital signs, relevant labs/tests and NPO status   Procedure details (see MAR for exact dosages):    Preoxygenation:  Room air   Sedation:  Etomidate   Intended level of sedation: deep   Intra-procedure monitoring:  Blood pressure monitoring   Intra-procedure events: none  Total Provider sedation time (minutes):  30 Post-procedure details:    Post-sedation assessments completed and  reviewed: airway patency and mental status     Procedure completion:  Tolerated well, no immediate complications   CRITICAL CARE Performed by: Wandra Arthurs   Total critical care time:30 minutes  Critical care time was exclusive of separately billable procedures and treating other patients.  Critical care was necessary to treat or prevent imminent or life-threatening deterioration.  Critical care was time spent personally by me on the following activities: development of treatment plan with patient and/or surrogate as well as nursing, discussions with consultants, evaluation of patient's response to treatment, examination of patient, obtaining history from patient or surrogate, ordering and performing treatments and interventions, ordering and review of laboratory studies, ordering and review of radiographic studies, pulse oximetry and re-evaluation of patient's condition.   Medications Ordered in ED Medications  diltiazem (CARDIZEM) 125 mg in dextrose 5% 125 mL (1 mg/mL) infusion (5 mg/hr Intravenous New Bag/Given 11/06/21 1812)  apixaban (ELIQUIS) tablet 5 mg (has no administration in time range)  sodium chloride 0.9 % bolus 1,000 mL (1,000 mLs Intravenous New Bag/Given 11/06/21 1633)  diltiazem (CARDIZEM CD) 24 hr capsule 120 mg (120 mg Oral Given 11/06/21 1646)  etomidate (AMIDATE) injection 10 mg (10 mg Intravenous Given 11/06/21 1851)  etomidate (AMIDATE) injection (10 mg Intravenous Given 11/06/21 1853)    ED Course  I have reviewed the triage vital signs and the nursing notes.  Pertinent labs & imaging results that were available during my care of the patient were reviewed by me and considered in my medical decision making (see chart for details).    MDM Rules/Calculators/A&P                         ZAKIAH BECKERMAN is a 60 y.o. male here with new onset rapid A. fib.  Patient was taken away in the 140s prior to arrival and received a dose of Cardizem.  We will check  electrolytes and troponin and also get D-dimer.  Patient's heart rate is in the 90s now.  Patient's CHA2DS2-VASc score is 1 so does not require anticoagulation right now.   6 pm Patient's heart rate went up to 140.  Patient states that he saw Dr. [name] previously.  I talked to Dr. [name] who will see patient  7:39 PM I performed conscious sedation while Dr. [name] performed cardioversion.  Dr. [name] recommend Eliquis 5 mg twice daily and Cardizem 120 mg CD.  Patient is awake and alert and back in sinus rhythm. He will have patient follow-up with him in office.     Final Clinical Impression(s) / ED Diagnoses Final diagnoses:  None    Rx / DC Orders ED Discharge Orders     None        Drenda Freeze, MD 11/06/21 1940    Drenda Freeze, MD 11/06/21 [phone]

## 2021-11-06 NOTE — ED Notes (Addendum)
Patient on 12 lead, EMS notified. Medic from previous transport truck at bedside with patient on monitor, medic requested not to place Zoll pads on patient, stated he would just pull them off. HR varying from 120-190, patient occasionally short of breath, otherwise stable. Denies chest pain

## 2021-11-07 ENCOUNTER — Other Ambulatory Visit: Payer: Self-pay

## 2021-11-07 MED ORDER — APIXABAN 5 MG PO TABS: 5.0000 mg | ORAL_TABLET | Freq: Two times a day (BID) | ORAL | 1 refills | Status: DC

## 2021-11-14 NOTE — Progress Notes (Signed)
Primary Physician/Referring:  Nathan Morning, DO  Patient ID: Nathan Ballard, male    DOB: 1961/03/12, 60 y.o.   MRN: [id number]  Chief Complaint  Patient presents with   Atrial Fibrillation   Follow-up    10 DAYS   HPI:    Nathan Ballard  is a 61 y.o. Caucasian white male with no significant prior cardiovascular history except for hypertension and hyperlipidemia which is well managed.  Patient does have family history of premature CAD (father with MI in 31s). Presented with new onset atrial fibrillation to [facility] emergency department 11/06/2021, underwent direct-current cardioversion as he was symptomatic and hypotensive.  Patient presented to ED 11/06/2021 with rapid palpitations, found to be in new onset atrial fibrillation.  As patient was symptomatic and hypotensive shared decision was to proceed with direct-current cardioversion in the ED.  Patient underwent successful direct-current cardioversion 11/06/2021 by Dr. [name].  He was subsequently discharged on Eliquis, diltiazem CD 120 mg daily, and advised to continue at home Bystolic. He now presents for follow up and to establish care with our office.   Patient has had no recurrence of palpitations, chest pressure since cardioversion.  He is taking both Bystolic and diltiazem presently and has noticed heart rate in the 40s on home monitoring. He reports intermittent episodes of lightheadedness since discharge from the ED. He typically exercises 3 days per week doing cardio and weigh training exercises.   Notably patient had been without Bystolic for about 5 days prior to presenting on 11/06/21 with atrial fib.  He is tolerating anticoagulation without bleeding diathesis.  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   seizure free since 2017   Diverticulosis of colon    Essential hypertension    Generalized anxiety disorder    History of adenomatous polyp of colon 09/03/2012   tubual adenoma and hyperplastic   History of basal cell carcinoma excision 08/21/2014   scalp; also left lateral neck (02/15/2015)   History of COVID-19 06/2020   Hyperlipidemia    Hypomagnesemia 03/02/2016   Leukocytosis    Major depressive disorder 02/29/2016   Mild neurocognitive disorder, unclear etiology 09/27/2020   OA (osteoarthritis)    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   Subcutaneous cyst    lower abdomen and right forearm   Past Surgical History:  Procedure Laterality Date   ANTERIOR CRUCIATE LIGAMENT REPAIR Left 1989; 2007   COLONOSCOPY     COLONOSCOPY WITH PROPOFOL  09/03/2012   KNEE ARTHROSCOPY WITH MEDIAL MENISECTOMY Right 09/19/2015   Procedure: RIGHT KNEE ARTHROSCOPY WITH PARTIAL MEDIAL MENISCECTOMY;  Surgeon: Leandrew Koyanagi, MD;  Location: [facility];  Service: Orthopedics;  Laterality: Right;   LIPOMA EXCISION Right 05/01/2017   Procedure: EXCISION OF CYST FROM RIGHT FOREARM AND LOWER ABDOMEN;  Surgeon: Clovis Riley, MD;  Location: [facility];  Service: General;  Laterality: Right;  site= right arm and abdomen   POLYPECTOMY     REMOVAL GREAT TOE NAIL Right [place] ECHOCARDIOGRAM  03/01/2016   GRADE 1 DIASTOLIC DYSFUNCTION/  EF 55-60%   WRIST GANGLION EXCISION Right 1998   Family History  Problem Relation Age of Onset   Alzheimer's disease Mother        early-onset presentation; late 40s/early 60s   Diabetes Father    Heart disease Father    Hypertension Father    Alzheimer's disease Maternal Aunt        symptom onset during late 70s/early 33s   Alzheimer's disease Maternal  Grandmother        symptom onset during late 70s/early 80s   Colon cancer Neg Hx    Stomach cancer Neg Hx    Colon polyps Neg Hx    Esophageal cancer Neg Hx    Rectal cancer Neg Hx     Social History   Tobacco Use   Smoking status: Never   Smokeless tobacco: Never  Substance Use Topics   Alcohol use: Yes    Comment: rarely   Marital Status: Married   ROS  Review of Systems  Constitutional: Negative for malaise/fatigue and weight gain.  Cardiovascular:  Negative for chest pain, claudication, leg swelling, near-syncope, orthopnea, palpitations, paroxysmal nocturnal dyspnea and syncope.  Respiratory:  Negative for shortness of breath.   Neurological:  Positive for light-headedness.   Objective  Blood pressure 117/65, pulse (!) 49, temperature 98 F (36.7 C), temperature source Temporal, resp. rate 17, height 6' (1.829 m), weight 218 lb 6.4 oz (99.1 kg), SpO2 98 %.  Vitals with BMI 11/15/2021 11/06/2021 11/06/2021  Height 6\' 0"  - -  Weight 218 lbs 6 oz - -  BMI 41.74 - -  Systolic 081 448 185  Diastolic 65 74 79  Pulse 49 62 64      Physical Exam Vitals reviewed.  HENT:     Head: Normocephalic and atraumatic.  Cardiovascular:     Rate and Rhythm: Regular rhythm. Bradycardia present.     Pulses: Intact distal pulses.     Heart sounds: S1 normal and S2 normal. No murmur heard.   No gallop.  Pulmonary:     Effort: Pulmonary effort is normal. No respiratory distress.     Breath sounds: No wheezing, rhonchi or rales.  Musculoskeletal:     Right lower leg: Edema (minimal) present.     Left lower leg: Edema (minimal) present.  Neurological:     Mental Status: He is alert.    Laboratory examination:   Recent Labs    11/06/21 1621  NA 138  K 3.8  CL 105  CO2 24  GLUCOSE 112*  BUN 19  CREATININE 0.98  CALCIUM 8.9  GFRNONAA >60   estimated creatinine clearance is 97.7 mL/min (by C-G formula based on SCr of 0.98 mg/dL).  CMP Latest Ref Rng & Units 11/06/2021  05/01/2017 03/31/2016  Glucose 70 - 99 mg/dL 112(H) 94 94  BUN 6 - 20 mg/dL 19 - 13  Creatinine 0.61 - 1.24 mg/dL 0.98 - 0.75  Sodium 135 - 145 mmol/L 138 143 140  Potassium 3.5 - 5.1 mmol/L 3.8 4.0 4.0  Chloride 98 - 111 mmol/L 105 - 106  CO2 22 - 32 mmol/L 24 - 28  Calcium 8.9 - 10.3 mg/dL 8.9 - 8.9  Total Protein 6.5 - 8.1 g/dL 6.3(L) - 5.7(L)  Total Bilirubin 0.3 - 1.2 mg/dL 0.8 - 0.5  Alkaline Phos 38 - 126 U/L 53 - 43  AST 15 - 41 U/L 33 - 18  ALT 0 - 44 U/L 39 - 15(L)   CBC Latest Ref Rng & Units 11/06/2021 05/01/2017 03/31/2016  WBC 4.0 - 10.5 K/uL 5.0 - 5.1  Hemoglobin 13.0 - 17.0 g/dL 14.8 13.3 13.3  Hematocrit 39.0 - 52.0 % 42.3 39.0 40.4  Platelets 150 - 400 K/uL 177 - 192    Lipid Panel No results for input(s): CHOL, TRIG, LDLCALC, VLDL, HDL, CHOLHDL, LDLDIRECT in the last 8760 hours.  HEMOGLOBIN A1C No results found for: HGBA1C, MPG TSH No results for input(s): TSH in the last 8760 hours.  External labs:  08/28/2021: Total cholesterol 144, triglycerides 66, HDL 57, LDL 74.  Non-HDL cholesterol 87. TSH normal at 1.89.  Allergies   Allergies  Allergen Reactions   Morphine And Related Nausea And Vomiting   Duloxetine Hcl     Other reaction(s): Unknown    Medications Prior to Visit:   Outpatient Medications Prior to Visit  Medication Sig Dispense Refill   acetaminophen (TYLENOL) 500 MG tablet Take 500 mg by mouth every 6 (six) hours as needed for headache or mild pain.     alfuzosin (UROXATRAL) 10 MG 24 hr tablet Take 10 mg by mouth daily.     apixaban (ELIQUIS) 5 MG TABS tablet Take 1 tablet (5 mg total) by mouth 2 (two) times daily. 180 tablet 1   atorvastatin (LIPITOR) 40 MG tablet Take 20 mg by mouth every Ballard. 1/2 tablet of 40 mg     Cholecalciferol (VITAMIN D-3) 1000 units CAPS Take 1 capsule by mouth every Ballard.     donepezil (ARICEPT) 10 MG tablet TAKE 1 TABLET BY MOUTH AT BEDTIME (Patient taking differently: Take 10 mg by mouth at bedtime.) 30  tablet 5   finasteride (PROSCAR) 5 MG tablet Take 5 mg by mouth daily.     Multiple Vitamin (MULTIVITAMIN) tablet Take 1 tablet by mouth daily.     nebivolol (BYSTOLIC) 10 MG tablet Take 10 mg by mouth every Ballard.      oxybutynin (DITROPAN XL) 15 MG 24 hr tablet Take 15 mg by mouth daily.     saw palmetto 160 MG capsule Take 160 mg by mouth daily.     solifenacin (VESICARE) 10 MG tablet Take 1 tablet by mouth daily.     tamsulosin (FLOMAX) 0.4 MG CAPS capsule Take 0.8 mg by mouth at bedtime.     diltiazem (CARDIZEM CD) 120 MG 24 hr capsule Take 1 capsule (120 mg total) by mouth daily. 30 capsule 0   tiZANidine (ZANAFLEX) 4 MG tablet Take 1 tablet (4 mg total) by mouth every 6 (six) hours as needed for muscle spasms. 30 tablet 2   zinc sulfate 220 (50 Zn) MG capsule Take 1 capsule (220 mg total) by mouth daily. 42 capsule 0   Facility-Administered Medications Prior to Visit  Medication Dose Route Frequency Provider Last Rate Last Admin   0.9 %  sodium chloride infusion  500 mL Intravenous Continuous Pyrtle, Lajuan Lines, MD       Final Medications at End of Visit    Current Meds  Medication Sig   acetaminophen (TYLENOL) 500 MG tablet Take 500 mg by mouth every 6 (six) hours as needed for headache or mild pain.   alfuzosin (UROXATRAL) 10 MG 24 hr tablet Take 10 mg by mouth daily.   apixaban (ELIQUIS) 5 MG TABS tablet Take 1 tablet (5 mg total) by mouth 2 (two) times daily.   atorvastatin (LIPITOR) 40 MG tablet Take 20 mg by mouth every Ballard. 1/2 tablet of 40 mg   Cholecalciferol (VITAMIN D-3) 1000 units CAPS Take 1 capsule by mouth every Ballard.   diltiazem (CARDIZEM) 60 MG tablet  Take 1 tablet (60 mg total) by mouth 3 (three) times daily as needed (For heart rate >120 bpm).   donepezil (ARICEPT) 10 MG tablet TAKE 1 TABLET BY MOUTH AT BEDTIME (Patient taking differently: Take 10 mg by mouth at bedtime.)   finasteride (PROSCAR) 5 MG tablet Take 5 mg by mouth daily.   Multiple Vitamin  (MULTIVITAMIN) tablet Take 1 tablet by mouth daily.   nebivolol (BYSTOLIC) 10 MG tablet Take 10 mg by mouth every Ballard.    oxybutynin (DITROPAN XL) 15 MG 24 hr tablet Take 15 mg by mouth daily.   saw palmetto 160 MG capsule Take 160 mg by mouth daily.   solifenacin (VESICARE) 10 MG tablet Take 1 tablet by mouth daily.   tamsulosin (FLOMAX) 0.4 MG CAPS capsule Take 0.8 mg by mouth at bedtime.   [DISCONTINUED] diltiazem (CARDIZEM CD) 120 MG 24 hr capsule Take 1 capsule (120 mg total) by mouth daily.   Current Facility-Administered Medications for the 11/15/21 encounter (Office Visit) with Alethia Berthold, PA-C  Medication   0.9 %  sodium chloride infusion   Radiology:   No results found.  Cardiac Studies:   Direct current cardioversion 11/06/2021 7:04 PM Indication symptomatic A. Fibrillation  Post cardioversion EKG 11/06/2021: Normal sinus rhythm without evidence of ischemia.  EKG:   11/15/2021: Sinus bradycardia rate of 51 bpm.  Normal axis.  Nonspecific T of abnormality.  No evidence of ischemia or underlying injury pattern.   Post cardioversion EKG 11/06/2021: Normal sinus rhythm without evidence of ischemia.  11/06/2021: Atrial fibrillation with rapid ventricular sponsor rate of: 44 bpm, normal axis, diffuse nonspecific ST-T abnormality.  Compared to 05/01/2017, normal sinus rhythm.  Assessment     ICD-10-CM   1. Paroxysmal atrial fibrillation (HCC)  I48.0 EKG 12-Lead    PCV ECHOCARDIOGRAM COMPLETE    PCV MYOCARDIAL PERFUSION WO LEXISCAN    Ambulatory referral to Sleep Studies    2. Essential hypertension  I10 EKG 12-Lead    3. Hypercholesterolemia  E78.00     4. Family history of premature CAD  Z82.49 PCV MYOCARDIAL PERFUSION WO LEXISCAN    5. Leg edema  R60.0 PCV ECHOCARDIOGRAM COMPLETE    6. Snoring  R06.83 Ambulatory referral to Sleep Studies       Medications Discontinued During This Encounter  Medication Reason   tiZANidine (ZANAFLEX) 4 MG tablet     zinc sulfate 220 (50 Zn) MG capsule    diltiazem (CARDIZEM CD) 120 MG 24 hr capsule Side effect (s)    Meds ordered this encounter  Medications   diltiazem (CARDIZEM) 60 MG tablet    Sig: Take 1 tablet (60 mg total) by mouth 3 (three) times daily as needed (For heart rate >120 bpm).    Dispense:  30 tablet    Refill:  1   CHA2DS2-VASc Score is 1.  Yearly risk of stroke: 1.3% (HTN).   Recommendations:   ALYAAN BUDZYNSKI is a 61 y.o. Caucasian white male with no significant prior cardiovascular history except for hypertension and hyperlipidemia which is well managed.  Presented with new onset atrial fibrillation to [facility] emergency department 11/06/2021, underwent direct-current cardioversion as he was symptomatic and hypotensive.  Patient is accompanied by his wife, present at bedside.  He has had no known recurrence of atrial fibrillation since cardioversion on 10/29/2021.  However he has noted episodes of bradycardia and lightheadedness at home.  We will therefore stop diltiazem, continue Bystolic.  However will send for short acting diltiazem  60 mg to be taken as needed for episodes of heart rate >120 bpm.  Reviewed and discussed this with patient, both he and his wife verbalized understanding and agreement. EKG today reveals sinus bradycardia.  Discussed at length with patient regarding pathophysiology of atrial fibrillation as well as management.  He is tolerating anticoagulation without bleeding diathesis, will continue Eliquis at this time.  Given new onset atrial fibrillation, as well as multiple cardiovascular risk factors including family history of premature CAD, hypertension, and hyperlipidemia we will obtain nuclear stress test.  We will also obtain echocardiogram.  He has been evaluated by Dr. Brett Fairy in the past, will refer him back for further evaluation given new onset of atrial fibrillation and the patient endorses snoring. Blood pressure is well controlled.   Follow up in  6 weeks for results of cardiac testing.   During this visit I reviewed and updated: Tobacco history   allergies  medication reconciliation   medical history   surgical history   family history   social history.  This note was created using a voice recognition software as a result there may be grammatical errors inadvertently enclosed that do not reflect the nature of this encounter. Every attempt is made to correct such errors.   Alethia Berthold, PA-C 11/15/2021, 11:24 AM Office: [phone]

## 2021-11-15 ENCOUNTER — Encounter: Payer: Self-pay | Admitting: Student

## 2021-11-15 ENCOUNTER — Ambulatory Visit: Payer: BC Managed Care – PPO | Admitting: Student

## 2021-11-15 ENCOUNTER — Other Ambulatory Visit: Payer: Self-pay

## 2021-11-15 VITALS — BP 117/65 | HR 49 | Temp 98.0°F | Resp 17 | Ht 72.0 in | Wt 218.4 lb

## 2021-11-15 DIAGNOSIS — R600 Localized edema: Secondary | ICD-10-CM

## 2021-11-15 DIAGNOSIS — Z8249 Family history of ischemic heart disease and other diseases of the circulatory system: Secondary | ICD-10-CM

## 2021-11-15 DIAGNOSIS — R0683 Snoring: Secondary | ICD-10-CM

## 2021-11-15 DIAGNOSIS — E7800 Pure hypercholesterolemia, unspecified: Secondary | ICD-10-CM

## 2021-11-15 DIAGNOSIS — I10 Essential (primary) hypertension: Secondary | ICD-10-CM | POA: Diagnosis not present

## 2021-11-15 DIAGNOSIS — I480 Paroxysmal atrial fibrillation: Secondary | ICD-10-CM | POA: Diagnosis not present

## 2021-11-15 MED ORDER — DILTIAZEM HCL 60 MG PO TABS: 60.0000 mg | ORAL_TABLET | Freq: Three times a day (TID) | ORAL | 1 refills | Status: DC | PRN

## 2021-11-20 DIAGNOSIS — L821 Other seborrheic keratosis: Secondary | ICD-10-CM | POA: Diagnosis not present

## 2021-11-20 DIAGNOSIS — Z85828 Personal history of other malignant neoplasm of skin: Secondary | ICD-10-CM | POA: Diagnosis not present

## 2021-11-20 DIAGNOSIS — D225 Melanocytic nevi of trunk: Secondary | ICD-10-CM | POA: Diagnosis not present

## 2021-11-20 DIAGNOSIS — L812 Freckles: Secondary | ICD-10-CM | POA: Diagnosis not present

## 2021-12-04 ENCOUNTER — Other Ambulatory Visit: Payer: Self-pay

## 2021-12-04 ENCOUNTER — Ambulatory Visit: Payer: BC Managed Care – PPO

## 2021-12-04 DIAGNOSIS — I480 Paroxysmal atrial fibrillation: Secondary | ICD-10-CM

## 2021-12-04 DIAGNOSIS — R600 Localized edema: Secondary | ICD-10-CM

## 2021-12-04 DIAGNOSIS — Z8249 Family history of ischemic heart disease and other diseases of the circulatory system: Secondary | ICD-10-CM | POA: Diagnosis not present

## 2021-12-04 LAB — PCV MYOCARDIAL PERFUSION WO LEXISCAN: Base ST Depression (mm): 0 mm

## 2021-12-09 ENCOUNTER — Telehealth: Payer: Self-pay

## 2021-12-09 NOTE — Telephone Encounter (Signed)
Spoke with patient his questions were addressed to his satisfaction.

## 2021-12-27 DIAGNOSIS — R051 Acute cough: Secondary | ICD-10-CM | POA: Diagnosis not present

## 2021-12-27 DIAGNOSIS — Z23 Encounter for immunization: Secondary | ICD-10-CM | POA: Diagnosis not present

## 2021-12-27 DIAGNOSIS — R413 Other amnesia: Secondary | ICD-10-CM | POA: Diagnosis not present

## 2021-12-27 DIAGNOSIS — Z8679 Personal history of other diseases of the circulatory system: Secondary | ICD-10-CM | POA: Diagnosis not present

## 2021-12-27 DIAGNOSIS — R0602 Shortness of breath: Secondary | ICD-10-CM | POA: Diagnosis not present

## 2022-01-09 NOTE — Progress Notes (Signed)
Primary Physician/Referring:  [name], DO  Patient ID: Nathan Ballard, male    DOB: [birth year], 61 y.o.   MRN: [id number]  Chief Complaint  Patient presents with   Atrial Fibrillation   Results   Follow-up   HPI:    Nathan Ballard  is a 61 y.o. Caucasian white male with no significant prior cardiovascular history except for hypertension and hyperlipidemia which is well managed.  Patient does have family history of premature CAD (father with MI in 22s).  Presented with new onset atrial fibrillation 10/29/2021, with subsequent direct-current cardioversion as patient was symptomatic and hypotensive.  Patient presents for 6-week follow-up.  Last office visit discontinued diltiazem due to bradycardia and lightheadedness as well as ordered echocardiogram and stress test.  Patient was also referred for sleep evaluation.  Stress test was overall low risk.  Echocardiogram revealed normal LVEF with mild TR and otherwise no abnormalities.  Patient continues to complain of lightheadedness as well as occasional episodes of palpitations.  He also states he has mildly reduced exercise intolerance, particularly with high intensity cardiac workouts.  During medication reconciliation patient revealed he has not been taking Bystolic, but rather bisoprolol.  Patient states his episodes of bradycardia has improved since stopping diltiazem at last office visit.  He is tolerating anticoagulation without bleeding diathesis, but does express desire to discontinue anticoagulation if able.  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   seizure free since 2017    Diverticulosis of colon    Essential hypertension    Generalized anxiety disorder    History of adenomatous polyp of colon 09/03/2012   tubual adenoma and hyperplastic   History of basal cell carcinoma excision 08/21/2014   scalp; also left lateral neck (02/15/2015)   History of COVID-19 06/2020   Hyperlipidemia    Hypomagnesemia 03/02/2016   Leukocytosis    Major depressive disorder 02/29/2016   Mild neurocognitive disorder, unclear etiology 09/27/2020   OA (osteoarthritis)    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   Subcutaneous cyst    lower abdomen and right forearm   Past Surgical History:  Procedure Laterality Date   ANTERIOR CRUCIATE LIGAMENT REPAIR Left 1989; 2007   COLONOSCOPY     COLONOSCOPY WITH PROPOFOL  09/03/2012   KNEE ARTHROSCOPY WITH MEDIAL MENISECTOMY Right 09/19/2015   Procedure: RIGHT KNEE ARTHROSCOPY WITH PARTIAL MEDIAL MENISCECTOMY;  Surgeon: Leandrew Koyanagi, MD;  Location: [place];  Service: Orthopedics;  Laterality: Right;   LIPOMA EXCISION Right 05/01/2017   Procedure: EXCISION OF CYST FROM RIGHT FOREARM AND LOWER ABDOMEN;  Surgeon: Clovis Riley, MD;  Location: [place];  Service: General;  Laterality: Right;  site= right arm and abdomen   POLYPECTOMY     REMOVAL GREAT TOE NAIL Right 1993   SHOULDER SURGERY Right 1999   TRANSTHORACIC ECHOCARDIOGRAM  03/01/2016   GRADE 1 DIASTOLIC DYSFUNCTION/  EF 55-60%   WRIST GANGLION EXCISION Right 1998   Family History  Problem Relation Age of Onset   Alzheimer's disease Mother        early-onset presentation; late 40s/early 33s   Diabetes Father    Heart disease Father    Hypertension  Father    Alzheimer's disease Maternal Aunt        symptom onset during late 70s/early 80s   Alzheimer's disease Maternal Grandmother        symptom onset during late 70s/early 80s   Colon cancer Neg Hx    Stomach cancer Neg Hx    Colon polyps Neg Hx    Esophageal cancer Neg Hx    Rectal cancer Neg  Hx     Social History   Tobacco Use   Smoking status: Never   Smokeless tobacco: Never  Substance Use Topics   Alcohol use: Yes    Comment: rarely   Marital Status: Married   ROS  Review of Systems  Constitutional: Positive for malaise/fatigue. Negative for weight gain.  Cardiovascular:  Negative for chest pain, claudication, leg swelling, near-syncope, orthopnea, palpitations, paroxysmal nocturnal dyspnea and syncope.  Respiratory:  Negative for shortness of breath.   Neurological:  Positive for light-headedness.   Objective  Blood pressure 122/74, pulse (!) 59, temperature 98 F (36.7 C), temperature source Temporal, resp. rate 16, height 6' (1.829 m), weight 210 lb (95.3 kg), SpO2 96 %.  Vitals with BMI 01/10/2022 11/15/2021 11/06/2021  Height 6\' 0"  6\' 0"  -  Weight 210 lbs 218 lbs 6 oz -  BMI 59.16 38.46 -  Systolic 659 935 701  Diastolic 74 65 74  Pulse 59 49 62      Physical Exam Vitals reviewed.  Cardiovascular:     Rate and Rhythm: Normal rate and regular rhythm.     Pulses: Intact distal pulses.     Heart sounds: S1 normal and S2 normal. No murmur heard.   No gallop.  Pulmonary:     Effort: Pulmonary effort is normal. No respiratory distress.     Breath sounds: No wheezing, rhonchi or rales.  Musculoskeletal:     Right lower leg: Edema (minimal) present.     Left lower leg: Edema (minimal) present.  Neurological:     Mental Status: He is alert.    Laboratory examination:   Recent Labs    11/06/21 1621  NA 138  K 3.8  CL 105  CO2 24  GLUCOSE 112*  BUN 19  CREATININE 0.98  CALCIUM 8.9  GFRNONAA >60   CrCl cannot be calculated (Patient's most recent lab result is older than the maximum 21 days allowed.).  CMP Latest Ref Rng & Units 11/06/2021 05/01/2017 03/31/2016  Glucose 70 - 99 mg/dL 112(H) 94 94  BUN 6 - 20 mg/dL 19 - 13  Creatinine 0.61 - 1.24 mg/dL 0.98 - 0.75  Sodium 135 - 145 mmol/L 138 143 140  Potassium 3.5 - 5.1 mmol/L 3.8 4.0 4.0   Chloride 98 - 111 mmol/L 105 - 106  CO2 22 - 32 mmol/L 24 - 28  Calcium 8.9 - 10.3 mg/dL 8.9 - 8.9  Total Protein 6.5 - 8.1 g/dL 6.3(L) - 5.7(L)  Total Bilirubin 0.3 - 1.2 mg/dL 0.8 - 0.5  Alkaline Phos 38 - 126 U/L 53 - 43  AST 15 - 41 U/L 33 - 18  ALT 0 - 44 U/L 39 - 15(L)   CBC Latest Ref Rng & Units 11/06/2021 05/01/2017 03/31/2016  WBC 4.0 - 10.5 K/uL 5.0 - 5.1  Hemoglobin 13.0 - 17.0 g/dL 14.8 13.3 13.3  Hematocrit 39.0 - 52.0 % 42.3 39.0 40.4  Platelets 150 - 400 K/uL 177 - 192    Lipid Panel No results for input(s): CHOL, TRIG, LDLCALC, VLDL, HDL, CHOLHDL, LDLDIRECT  in the last 8760 hours.  HEMOGLOBIN A1C No results found for: HGBA1C, MPG TSH No results for input(s): TSH in the last 8760 hours.  External labs:  08/28/2021: Total cholesterol 144, triglycerides 66, HDL 57, LDL 74.  Non-HDL cholesterol 87. TSH normal at 1.89.  Allergies   Allergies  Allergen Reactions   Morphine And Related Nausea And Vomiting   Duloxetine Hcl     Other reaction(s): Unknown    Medications Prior to Visit:   Outpatient Medications Prior to Visit  Medication Sig Dispense Refill   acetaminophen (TYLENOL) 500 MG tablet Take 500 mg by mouth every 6 (six) hours as needed for headache or mild pain.     alfuzosin (UROXATRAL) 10 MG 24 hr tablet Take 10 mg by mouth daily.     apixaban (ELIQUIS) 5 MG TABS tablet Take 1 tablet (5 mg total) by mouth 2 (two) times daily. 180 tablet 1   atorvastatin (LIPITOR) 40 MG tablet Take 20 mg by mouth every morning. 1/2 tablet of 40 mg     Cholecalciferol (VITAMIN D-3) 1000 units CAPS Take 1 capsule by mouth every morning.     donepezil (ARICEPT) 10 MG tablet TAKE 1 TABLET BY MOUTH AT BEDTIME (Patient taking differently: Take 10 mg by mouth at bedtime.) 30 tablet 5   finasteride (PROSCAR) 5 MG tablet Take 5 mg by mouth daily.     Multiple Vitamin (MULTIVITAMIN) tablet Take 1 tablet by mouth daily.     oxybutynin (DITROPAN XL) 15 MG 24 hr tablet Take 15 mg  by mouth daily.     saw palmetto 160 MG capsule Take 160 mg by mouth daily.     solifenacin (VESICARE) 10 MG tablet Take 1 tablet by mouth daily.     tamsulosin (FLOMAX) 0.4 MG CAPS capsule Take 0.8 mg by mouth at bedtime.     bisoprolol (ZEBETA) 10 MG tablet Take 10 mg by mouth daily.     diltiazem (CARDIZEM) 60 MG tablet Take 1 tablet (60 mg total) by mouth 3 (three) times daily as needed (For heart rate >120 bpm). 30 tablet 1   nebivolol (BYSTOLIC) 10 MG tablet Take 10 mg by mouth every morning.  (Patient not taking: Reported on 01/10/2022)     Facility-Administered Medications Prior to Visit  Medication Dose Route Frequency Provider Last Rate Last Admin   0.9 %  sodium chloride infusion  500 mL Intravenous Continuous Pyrtle, Lajuan Lines, MD       Final Medications at End of Visit    Current Meds  Medication Sig   acetaminophen (TYLENOL) 500 MG tablet Take 500 mg by mouth every 6 (six) hours as needed for headache or mild pain.   alfuzosin (UROXATRAL) 10 MG 24 hr tablet Take 10 mg by mouth daily.   apixaban (ELIQUIS) 5 MG TABS tablet Take 1 tablet (5 mg total) by mouth 2 (two) times daily.   atorvastatin (LIPITOR) 40 MG tablet Take 20 mg by mouth every morning. 1/2 tablet of 40 mg   Cholecalciferol (VITAMIN D-3) 1000 units CAPS Take 1 capsule by mouth every morning.   donepezil (ARICEPT) 10 MG tablet TAKE 1 TABLET BY MOUTH AT BEDTIME (Patient taking differently: Take 10 mg by mouth at bedtime.)   finasteride (PROSCAR) 5 MG tablet Take 5 mg by mouth daily.   Multiple Vitamin (MULTIVITAMIN) tablet Take 1 tablet by mouth daily.   oxybutynin (DITROPAN XL) 15 MG 24 hr tablet Take 15 mg by mouth daily.   saw palmetto  160 MG capsule Take 160 mg by mouth daily.   solifenacin (VESICARE) 10 MG tablet Take 1 tablet by mouth daily.   tamsulosin (FLOMAX) 0.4 MG CAPS capsule Take 0.8 mg by mouth at bedtime.   [DISCONTINUED] bisoprolol (ZEBETA) 10 MG tablet Take 10 mg by mouth daily.   Current  Facility-Administered Medications for the 01/10/22 encounter (Office Visit) with Alethia Berthold, PA-C  Medication   0.9 %  sodium chloride infusion   Radiology:   No results found.  Cardiac Studies:  PCV MYOCARDIAL PERFUSION WO LEXISCAN 12/04/2021 Normal ECG stress. The patient exercised for 6 minutes and 36 seconds of a Bruce protocol, achieving approximately 7.95 METs.  The heart rate response was normal. The blood pressure response was physiologic. No symptoms reported. Myocardial perfusion is normal. Overall LV systolic function is normal without regional wall motion abnormalities. Stress LV EF: 60%. No previous exam available for comparison. Low risk.  PCV ECHOCARDIOGRAM COMPLETE 41/28/7867 Normal LV systolic function with visual EF 60-65%. Left ventricle cavity is normal in size. Normal left ventricular wall thickness. Normal global wall motion. Normal diastolic filling pattern, normal LAP. Mild tricuspid regurgitation. No evidence of pulmonary hypertension. Compared to study 09/18/2015 no significant change.   Direct current cardioversion 11/06/2021 7:04 PM Indication symptomatic A. Fibrillation  Post cardioversion EKG 11/06/2021: Normal sinus rhythm without evidence of ischemia.  EKG:  01/10/2022: Sinus bradycardia at a rate of 53 bpm.  Normal axis.  Nonspecific T wave abnormality.  No evidence of ischemia or underlying injury pattern.  Compared EKG 11/15/2021, no significant change.  Post cardioversion EKG 11/06/2021: Normal sinus rhythm without evidence of ischemia.  11/06/2021: Atrial fibrillation with rapid ventricular sponsor rate of: 44 bpm, normal axis, diffuse nonspecific ST-T abnormality.  Compared to 05/01/2017, normal sinus rhythm.  Assessment     ICD-10-CM   1. Paroxysmal atrial fibrillation (HCC)  I48.0 EKG 12-Lead    bisoprolol (ZEBETA) 5 MG tablet    LONG TERM MONITOR (3-14 DAYS)    2. Essential hypertension  I10        Medications Discontinued During  This Encounter  Medication Reason   diltiazem (CARDIZEM) 60 MG tablet    nebivolol (BYSTOLIC) 10 MG tablet Change in therapy   bisoprolol (ZEBETA) 10 MG tablet Reorder    Meds ordered this encounter  Medications   bisoprolol (ZEBETA) 5 MG tablet    Sig: Take 1 tablet (5 mg total) by mouth daily.    Dispense:  30 tablet    Refill:  3   CHA2DS2-VASc Score is 1.  Yearly risk of stroke: 1.3% (HTN).   Recommendations:   Nathan Ballard is a 61 y.o. Caucasian white male with no significant prior cardiovascular history except for hypertension and hyperlipidemia which is well managed.  Presented with new onset atrial fibrillation to [facility] emergency department 11/06/2021, underwent direct-current cardioversion as he was symptomatic and hypotensive.  Patient presents for 6-week follow-up.  Last office visit discontinued diltiazem due to bradycardia and lightheadedness as well as ordered echocardiogram and stress test.  Patient was also referred for sleep evaluation.  Stress test was overall low risk.  Echocardiogram revealed normal LVEF with mild TR and otherwise no abnormalities.  Reviewed and discussed results of echocardiogram and stress test with patient and his wife who is present at bedside.  Given continued episodes of lightheadedness as well as sinus bradycardia on EKG we will reduce bisoprolol from 10 mg to 5 mg daily.  We will also obtain 2-week cardiac  monitor to evaluate for recurrence of atrial fibrillation.  Could consider discontinuing anticoagulation if patient has no atrial fibrillation noted on monitor.  For now as patient is tolerating anticoagulation well bleeding diathesis will continue Eliquis.  She was previously evaluated by Dr. Brett Fairy for sleep apnea, however it is unclear what the results of this evaluation were given new onset A-fib and the patient reports snoring would recommend considering repeat sleep evaluation.  Follow up in 6 weeks, sooner if needed.     Alethia Berthold, PA-C 01/10/2022, 10:20 AM Office: [phone]

## 2022-01-10 ENCOUNTER — Inpatient Hospital Stay: Payer: BC Managed Care – PPO

## 2022-01-10 ENCOUNTER — Other Ambulatory Visit: Payer: Self-pay

## 2022-01-10 ENCOUNTER — Ambulatory Visit: Payer: BC Managed Care – PPO | Admitting: Student

## 2022-01-10 ENCOUNTER — Encounter: Payer: Self-pay | Admitting: Student

## 2022-01-10 VITALS — BP 122/74 | HR 59 | Temp 98.0°F | Resp 16 | Ht 72.0 in | Wt 210.0 lb

## 2022-01-10 DIAGNOSIS — I10 Essential (primary) hypertension: Secondary | ICD-10-CM | POA: Diagnosis not present

## 2022-01-10 DIAGNOSIS — I480 Paroxysmal atrial fibrillation: Secondary | ICD-10-CM

## 2022-01-10 MED ORDER — BISOPROLOL FUMARATE 5 MG PO TABS: 5.0000 mg | ORAL_TABLET | Freq: Every day | ORAL | 3 refills | Status: DC

## 2022-01-31 DIAGNOSIS — I480 Paroxysmal atrial fibrillation: Secondary | ICD-10-CM | POA: Diagnosis not present

## 2022-02-06 DIAGNOSIS — I480 Paroxysmal atrial fibrillation: Secondary | ICD-10-CM | POA: Diagnosis not present

## 2022-02-19 NOTE — Progress Notes (Signed)
? ?Primary Physician/Referring:  [name], DO ? ?Patient ID: Nathan Ballard, male    DOB: [birth year], 61 y.o.   MRN: [id number] ? ?Chief Complaint  ?Patient presents with  ? Atrial Fibrillation  ? Follow-up  ?  6 week  ? ?HPI:   ? ?Nathan Ballard  is a 61 y.o. Caucasian white male with no significant prior cardiovascular history except for hypertension and hyperlipidemia which is well managed.  Patient does have family history of premature CAD (father with MI in 50s).  Presented with new onset atrial fibrillation 10/29/2021, with subsequent direct-current cardioversion as patient was symptomatic and hypotensive. ? ?Patient presents for 6-week follow-up.  Last office visit reduce bisoprolol from 10 mg to 5 mg daily given bradycardia and ordered 2-week cardiac monitor, which revealed no recurrence of atrial fibrillation but rather symptomatic PACs and PVCs.  Patient is feeling well overall without specific complaints today.  He continues to do high intensity exercises without issue, in fact feels his energy and physical ability has improved since last office visit.  Patient states he gets his heart rate as high as 130 bpm without symptoms. ? ?Denies chest pain, dyspnea, syncope, near syncope.  He reports occasional palpitations unchanged compared to previous office visit. ? ?Past Medical History:  ?Diagnosis Date  ? Acute respiratory failure with hypoxia 03/02/2016  ? secondary to seizure activity  ? Arteriovenous malformation of precerebral vessels   ? posterior right frontal lobe angioma (asymptomatic)  ? Arthritis of carpometacarpal ([facility]) joint of left thumb   ? 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 seizure free since 2017  ? Diverticulosis of colon   ? Essential hypertension   ? Generalized anxiety disorder   ? History of adenomatous polyp of colon 09/03/2012  ? tubual adenoma and hyperplastic  ? History of basal cell carcinoma excision  08/21/2014  ? scalp; also left lateral neck (02/15/2015)  ? History of COVID-19 06/2020  ? Hyperlipidemia   ? Hypomagnesemia 03/02/2016  ? Leukocytosis   ? Major depressive disorder 02/29/2016  ? Mild neurocognitive disorder, unclear etiology 09/27/2020  ? OA (osteoarthritis)   ? 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  ? Subcutaneous cyst   ? lower abdomen and right forearm  ? ?Past Surgical History:  ?Procedure Laterality Date  ? ANTERIOR CRUCIATE LIGAMENT REPAIR Left 1989; 2007  ? COLONOSCOPY    ? COLONOSCOPY WITH PROPOFOL  09/03/2012  ? KNEE ARTHROSCOPY WITH MEDIAL MENISECTOMY Right 09/19/2015  ? Procedure: RIGHT KNEE ARTHROSCOPY WITH PARTIAL MEDIAL MENISCECTOMY;  Surgeon: Leandrew Koyanagi, MD;  Location: [place];  Service: Orthopedics;  Laterality: Right;  ? LIPOMA EXCISION Right 05/01/2017  ? Procedure: EXCISION OF CYST FROM RIGHT FOREARM AND LOWER ABDOMEN;  Surgeon: Clovis Riley, MD;  Location: [place];  Service: General;  Laterality: Right;  site= right arm and abdomen  ? POLYPECTOMY    ? REMOVAL GREAT TOE NAIL Right 1993  ? SHOULDER SURGERY Right 1999  ? TRANSTHORACIC ECHOCARDIOGRAM  03/01/2016  ? GRADE 1 DIASTOLIC DYSFUNCTION/  EF 55-60%  ? WRIST GANGLION EXCISION Right 1998  ? ?Family History  ?Problem Relation Age of Onset  ? Alzheimer's disease Mother   ?     early-onset presentation; late 40s/early 79s  ? Diabetes Father   ? Heart disease Father   ? Hypertension Father   ? Alzheimer's disease Maternal Aunt   ?     symptom onset during late 70s/early 80s  ?  Alzheimer's disease Maternal Grandmother   ?     symptom onset during late 70s/early 80s  ? Colon cancer Neg Hx   ? Stomach cancer Neg Hx   ? Colon polyps Neg Hx   ? Esophageal cancer Neg Hx   ? Rectal cancer Neg Hx   ?  ?Social History  ? ?Tobacco Use  ? Smoking status: Never  ? Smokeless tobacco: Never  ?Substance Use Topics  ? Alcohol use: Yes  ?  Comment: rarely  ? ?Marital Status: Married  ? ?ROS  ?Review of Systems   ?Constitutional: Negative for malaise/fatigue and weight gain.  ?Cardiovascular:  Positive for palpitations (rare). Negative for chest pain, claudication, leg swelling, near-syncope, orthopnea, paroxysmal nocturnal dyspnea and syncope.  ?Respiratory:  Negative for shortness of breath.   ?Neurological:  Negative for light-headedness.  ? ?Objective  ?Blood pressure 138/82, pulse (!) 47, temperature 98 ?F (36.7 ?C), resp. rate 16, height 6' (1.829 m), weight 211 lb (95.7 kg), SpO2 98 %.  ? ?  02/21/2022  ?  8:51 AM 01/10/2022  ?  8:40 AM 11/15/2021  ?  8:37 AM  ?Vitals with BMI  ?Height '6\' 0"'$  '6\' 0"'$  '6\' 0"'$   ?Weight 211 lbs 210 lbs 218 lbs 6 oz  ?BMI 28.61 28.47 29.61  ?Systolic 622 633 354  ?Diastolic 82 74 65  ?Pulse 47 59 49  ?  ? ? Physical Exam ?Vitals reviewed.  ?Cardiovascular:  ?   Rate and Rhythm: Normal rate and regular rhythm.  ?   Pulses: Intact distal pulses.  ?   Heart sounds: S1 normal and S2 normal. No murmur heard. ?  No gallop.  ?Pulmonary:  ?   Effort: Pulmonary effort is normal. No respiratory distress.  ?   Breath sounds: No wheezing, rhonchi or rales.  ?Musculoskeletal:  ?   Right lower leg: No edema.  ?   Left lower leg: No edema.  ?Neurological:  ?   Mental Status: He is alert.  ? ? ?Laboratory examination:  ? ?Recent Labs  ?  11/06/21 ?5625  ?NA 138  ?K 3.8  ?CL 105  ?CO2 24  ?GLUCOSE 112*  ?BUN 19  ?CREATININE 0.98  ?CALCIUM 8.9  ?GFRNONAA >60  ? ?CrCl cannot be calculated (Patient's most recent lab result is older than the maximum 21 days allowed.).  ? ?  Latest Ref Rng & Units 11/06/2021  ?  4:21 PM 05/01/2017  ?  7:14 AM 03/31/2016  ?  2:19 AM  ?CMP  ?Glucose 70 - 99 mg/dL 112   94   94    ?BUN 6 - 20 mg/dL 19    13    ?Creatinine 0.61 - 1.24 mg/dL 0.98    0.75    ?Sodium 135 - 145 mmol/L 138   143   140    ?Potassium 3.5 - 5.1 mmol/L 3.8   4.0   4.0    ?Chloride 98 - 111 mmol/L 105    106    ?CO2 22 - 32 mmol/L 24    28    ?Calcium 8.9 - 10.3 mg/dL 8.9    8.9    ?Total Protein 6.5 - 8.1 g/dL 6.3     5.7    ?Total Bilirubin 0.3 - 1.2 mg/dL 0.8    0.5    ?Alkaline Phos 38 - 126 U/L 53    43    ?AST 15 - 41 U/L 33    18    ?ALT 0 -  44 U/L 39    15    ? ? ?  Latest Ref Rng & Units 11/06/2021  ?  4:21 PM 05/01/2017  ?  7:14 AM 03/31/2016  ?  2:19 AM  ?CBC  ?WBC 4.0 - 10.5 K/uL 5.0    5.1    ?Hemoglobin 13.0 - 17.0 g/dL 14.8   13.3   13.3    ?Hematocrit 39.0 - 52.0 % 42.3   39.0   40.4    ?Platelets 150 - 400 K/uL 177    192    ? ? ?Lipid Panel ?No results for input(s): CHOL, TRIG, LDLCALC, VLDL, HDL, CHOLHDL, LDLDIRECT in the last 8760 hours. ? ?HEMOGLOBIN A1C ?No results found for: HGBA1C, MPG ?TSH ?No results for input(s): TSH in the last 8760 hours. ? ?External labs:  ?08/28/2021: ?Total cholesterol 144, triglycerides 66, HDL 57, LDL 74.  Non-HDL cholesterol 87. ?TSH normal at 1.89. ? ?Allergies  ? ?Allergies  ?Allergen Reactions  ? Morphine And Related Nausea And Vomiting  ? Duloxetine Hcl   ?  Other reaction(s): Unknown  ?  ?Medications Prior to Visit:  ? ?Outpatient Medications Prior to Visit  ?Medication Sig Dispense Refill  ? acetaminophen (TYLENOL) 500 MG tablet Take 500 mg by mouth every 6 (six) hours as needed for headache or mild pain.    ? alfuzosin (UROXATRAL) 10 MG 24 hr tablet Take 10 mg by mouth daily.    ? atorvastatin (LIPITOR) 40 MG tablet Take 20 mg by mouth every morning. 1/2 tablet of 40 mg    ? Cholecalciferol (VITAMIN D-3) 1000 units CAPS Take 1 capsule by mouth every morning.    ? diltiazem (CARDIZEM) 60 MG tablet Take 60 mg by mouth 3 (three) times daily as needed.    ? donepezil (ARICEPT) 10 MG tablet TAKE 1 TABLET BY MOUTH AT BEDTIME (Patient taking differently: Take 10 mg by mouth at bedtime.) 30 tablet 5  ? finasteride (PROSCAR) 5 MG tablet Take 5 mg by mouth daily.    ? Multiple Vitamin (MULTIVITAMIN) tablet Take 1 tablet by mouth daily.    ? oxybutynin (DITROPAN XL) 15 MG 24 hr tablet Take 15 mg by mouth daily.    ? saw palmetto 160 MG capsule Take 160 mg by mouth daily.    ?  solifenacin (VESICARE) 10 MG tablet Take 1 tablet by mouth daily.    ? tamsulosin (FLOMAX) 0.4 MG CAPS capsule Take 0.8 mg by mouth at bedtime.    ? apixaban (ELIQUIS) 5 MG TABS tablet Take 1 tablet (5 mg total) by

## 2022-02-21 ENCOUNTER — Encounter: Payer: Self-pay | Admitting: Student

## 2022-02-21 ENCOUNTER — Ambulatory Visit: Payer: BC Managed Care – PPO | Admitting: Student

## 2022-02-21 VITALS — BP 138/82 | HR 47 | Temp 98.0°F | Resp 16 | Ht 72.0 in | Wt 211.0 lb

## 2022-02-21 DIAGNOSIS — I10 Essential (primary) hypertension: Secondary | ICD-10-CM | POA: Diagnosis not present

## 2022-02-21 DIAGNOSIS — I480 Paroxysmal atrial fibrillation: Secondary | ICD-10-CM | POA: Diagnosis not present

## 2022-03-28 DIAGNOSIS — M791 Myalgia, unspecified site: Secondary | ICD-10-CM | POA: Diagnosis not present

## 2022-03-28 DIAGNOSIS — R600 Localized edema: Secondary | ICD-10-CM | POA: Diagnosis not present

## 2022-03-28 DIAGNOSIS — M2550 Pain in unspecified joint: Secondary | ICD-10-CM | POA: Diagnosis not present

## 2022-04-28 NOTE — Progress Notes (Deleted)
NEUROLOGY FOLLOW UP OFFICE NOTE  [name] [id number]  Assessment/Plan:   Mild neurocognitive disorder    Due to family history and possibility that this may indicate pre-cursor to Alzheimer's, he is taking donepezil '10mg'$  at bedtime Lifestyle modification:  regular exercise (cardio and resistance training), cognitively-stimulating activities, Mediterranean diet, proper sleep hygiene Follow up in one year.   Subjective:  Nathan Ballard is a 61 year old right-handed man with hypertension and hyperlipidemia who follows up for memory deficits  He is accompanied by his wife who provides some history as well.   UPDATE: Taking donepezil '10mg'$  QHS. Overall doing okay.  Sometimes remembering all of the things that he needs to do for work.     HISTORY: His family has noticed memory problems since June 2021, after he lost his job related to the COVID-19 pandemic.  Sometimes when he is asked a question, he has a blank stare.  He has had trouble getting to places when driving on familiar routes but no trouble finding his way home.  No difficulty managing finances.  BCIT score was 2 (had trouble with name and address).  He has since been trying to find another job which has been frustrating.  He reports that his mother was diagnosed with Alzheimer's dementia in her late 29s-early 32s.  He underwent workup for memory loss.  Labs from October 2021 showed B12 698 and TSH 1.64.  MRI of brain with and without contrast on 09/17/2020 showed stable right frontal developmental venous anomaly but otherwise unremarkable.  Neuropsychological evaluation on 09/27/2020 demonstrated mild neurocognitive disorder with impairment across encoding, retrieval, and consolidation aspects of verbal memory.  Etiology unclear.  Despite weakness in verbal memory, he did not display a pattern of performance consistent with early-onset Alzheimer's disease but could not rule out.   In 2021, he reports a new severe paroxysmal  sharp pain in right temple lasting 5 to 8 seconds.  It has only occurred twice.   I saw him back in 2017 for spells with hypoxia and hypercapnia  He has had recurrent episodes in which he is unable to be woken up from a deep sleep with labored breathing.  It would last 10 to 15 minutes.  After he wakes up, he is either lucid or somewhat confused.  On a couple of occasions, he was given Narcan by EMS and he awoke.  However, UDS was negative so it was thought to be coincidental.  He had several ED visits.  On 02/28/16, he was admitted to [facility] following another spell.  He was started on Keppra '500mg'$  twice daily.  CT of head was unremarkable.  MRI of brain personally reviewed only revealed a right frontal developmental venous anomaly (stable compared to MRI in 2008), but no acute process.  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  He became unresponsive in the hospital and had an O2 sat of 19% and was making gurgling noises.  He was intubated for airway protection.  He was hypotensive but did not lose pulse. Echocardiogram was unremarkable and showed EF 55-60% with grade 1 diastolic dysfunction.  Routine EEG and overnight EEG were normal.  He was discharged on Keppra '1500mg'$  twice daily.  Acute hypoxic and hypercapnic respiratory failure was felt to be secondary to seizure.  He returned to the ED on 03/22/16 after another spell.  He was started  on Depakote.  Later that evening, he was standing in the kitchen and developed a glassy look in his eyes.  He was slow to respond.  He was sitting and awake but started to shake all over, followed by loss of consciousness.  Otherwise, he does not demonstrate any convulsions.  His wife drove him to nearby fire station for vitals, where his O2 sat was 68% on room air and 86% on O2.  He returned to the ED. Blood pressure was in the  90s/60s and pulse in the 40s.  He was given a loading dose of IV Depacon.   He continued to have spells in which he seems a little confused.  He does not demonstrate tongue biting or urinary/bowel incontinence.  Blood sugar has always been okay.  He was readmitted to the hospital on 03/29/16.  Long-term EEG was unremarkable.  Keppra was discontinued and he was started on gabapentin '300mg'$  three times daily.  He remained on Depakote.  72 hour EEG from 04/09/16 to 04/11/16 was normal but did not capture his habitual spells.  He had another spell on 04/14/16.  He was seen by pulmonology and cardiology, both did not suspect primary pulmonary or cardiac etiology.  Since then, he has been doing well.  He had taken himself off Depakote and gabapentin, as well as Cymbalta.  He has not had any other spells.  He has not had any apneic episodes during sleep.  He feels better.  His memory is better.  He was referred to the EMU at [facility] for further evaluation, as well as a sleep study, but he cancelled both since he had been feeling well.  Unclear if spells were central apneic.  He had a similar episode of confusion in 2008.  MRI of brain with and without contrast on 11/10/07 demonstrated the incidental developmental venous anomaly.  Episodes subsequently resolved and hasn't had another spell since 2017.  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   seizure free since 2017   Diverticulosis of colon    Essential hypertension    Generalized anxiety disorder    History of adenomatous polyp of colon 09/03/2012   tubual adenoma and hyperplastic   History of  basal cell carcinoma excision 08/21/2014   scalp; also left lateral neck (02/15/2015)   History of COVID-19 06/2020   Hyperlipidemia    Hypomagnesemia 03/02/2016   Leukocytosis    Major depressive disorder 02/29/2016   Mild neurocognitive disorder, unclear etiology 09/27/2020   OA (osteoarthritis)    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     alfuzosin (UROXATRAL) 10 MG 24 hr tablet Take 10 mg by mouth daily.     atorvastatin (LIPITOR) 40 MG tablet Take 20 mg by mouth every morning. 1/2 tablet of 40 mg     Cholecalciferol (VITAMIN D-3) 1000 units CAPS Take 1 capsule by mouth every morning.  diltiazem (CARDIZEM) 60 MG tablet Take 60 mg by mouth 3 (three) times daily as needed.     donepezil (ARICEPT) 10 MG tablet TAKE 1 TABLET BY MOUTH AT BEDTIME (Patient taking differently: Take 10 mg by mouth at bedtime.) 30 tablet 5   finasteride (PROSCAR) 5 MG tablet Take 5 mg by mouth daily.     Multiple Vitamin (MULTIVITAMIN) tablet Take 1 tablet by mouth daily.     oxybutynin (DITROPAN XL) 15 MG 24 hr tablet Take 15 mg by mouth daily.     saw palmetto 160 MG capsule Take 160 mg by mouth daily.     solifenacin (VESICARE) 10 MG tablet Take 1 tablet by mouth daily.     tamsulosin (FLOMAX) 0.4 MG CAPS capsule Take 0.8 mg by mouth at bedtime.     Current Facility-Administered Medications on File Prior to Visit  Medication Dose Route Frequency Provider Last Rate Last Admin   0.9 %  sodium chloride infusion  500 mL Intravenous Continuous Pyrtle, Lajuan Lines, MD        ALLERGIES: Allergies  Allergen Reactions   Morphine And Related Nausea And Vomiting   Duloxetine Hcl     Other reaction(s): Unknown    FAMILY HISTORY: Family History   Problem Relation Age of Onset   Alzheimer's disease Mother        early-onset presentation; late 40s/early 74s   Diabetes Father    Heart disease Father    Hypertension Father    Alzheimer's disease Maternal Aunt        symptom onset during late 70s/early 80s   Alzheimer's disease Maternal Grandmother        symptom onset during late 70s/early 80s   Colon cancer Neg Hx    Stomach cancer Neg Hx    Colon polyps Neg Hx    Esophageal cancer Neg Hx    Rectal cancer Neg Hx       Objective:  *** General: No acute distress.  Patient appears ***-groomed.   Head:  Normocephalic/atraumatic Eyes:  Fundi examined but not visualized Neck: supple, no paraspinal tenderness, full range of motion Heart:  Regular rate and rhythm Lungs:  Clear to auscultation bilaterally Back: No paraspinal tenderness Neurological Exam: alert and oriented to person, place, and time.  Speech fluent and not dysarthric, language intact.  CN II-XII intact. Bulk and tone normal, muscle strength 5/5 throughout.  Sensation to light touch intact.  Deep tendon reflexes 2+ throughout, toes downgoing.  Finger to nose testing intact.  Gait normal, Romberg negative.   Metta Clines, DO  CC:  Jani Gravel, MD  [name], DO

## 2022-04-29 ENCOUNTER — Ambulatory Visit: Payer: BC Managed Care – PPO | Admitting: Neurology

## 2022-04-29 NOTE — Progress Notes (Unsigned)
NEUROLOGY FOLLOW UP OFFICE NOTE  [name] [id number]  Assessment/Plan:   Mild neurocognitive disorder     Due to family history and possibility that this may indicate pre-cursor to Alzheimer's, he is taking donepezil '10mg'$  at bedtime Lifestyle modification:  regular exercise (cardio and resistance training), cognitively-stimulating activities, Mediterranean diet, proper sleep hygiene Follow up in one year.   Subjective:  Nathan Ballard is a 61 year old right-handed man with paroxysmal a fib, hypertension and hyperlipidemia who follows up for memory deficits  He is accompanied by his wife who provides some history as well.   UPDATE: Since last year, he has been diagnosed with paroxysmal atrial fibrillation. Taking donepezil '10mg'$  QHS. Overall doing okay.  Sometimes remembering all of the things that he needs to do for work.     HISTORY: His family has noticed memory problems since June 2021, after he lost his job related to the COVID-19 pandemic.  Sometimes when he is asked a question, he has a blank stare.  He has had trouble getting to places when driving on familiar routes but no trouble finding his way home.  No difficulty managing finances.  BCIT score was 2 (had trouble with name and address).  He has since been trying to find another job which has been frustrating.  He reports that his mother was diagnosed with Alzheimer's dementia in her late 16s-early 19s.  He underwent workup for memory loss.  Labs from October 2021 showed B12 698 and TSH 1.64.  MRI of brain with and without contrast on 09/17/2020 showed stable right frontal developmental venous anomaly but otherwise unremarkable.  Neuropsychological evaluation on 09/27/2020 demonstrated mild neurocognitive disorder with impairment across encoding, retrieval, and consolidation aspects of verbal memory.  Etiology unclear.  Despite weakness in verbal memory, he did not display a pattern of performance consistent with  early-onset Alzheimer's disease but could not rule out.   In 2021, he reports a new severe paroxysmal sharp pain in right temple lasting 5 to 8 seconds.  It has only occurred twice.   I saw him back in 2017 for spells with hypoxia and hypercapnia  He has had recurrent episodes in which he is unable to be woken up from a deep sleep with labored breathing.  It would last 10 to 15 minutes.  After he wakes up, he is either lucid or somewhat confused.  On a couple of occasions, he was given Narcan by EMS and he awoke.  However, UDS was negative so it was thought to be coincidental.  He had several ED visits.  On 02/28/16, he was admitted to [facility] following another spell.  He was started on Keppra '500mg'$  twice daily.  CT of head was unremarkable.  MRI of brain personally reviewed only revealed a right frontal developmental venous anomaly (stable compared to MRI in 2008), but no acute process.  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  He became unresponsive in the hospital and had an O2 sat of 19% and was making gurgling noises.  He was intubated for airway protection.  He was hypotensive but did not lose pulse. Echocardiogram was unremarkable and showed EF 55-60% with grade 1 diastolic dysfunction.  Routine EEG and overnight EEG were normal.  He was discharged on Keppra '1500mg'$  twice daily.  Acute hypoxic and hypercapnic respiratory failure was felt to be secondary to seizure.  He returned to the ED on 03/22/16 after another spell.  He was started on Depakote.  Later that evening, he was standing in the kitchen and developed a glassy look in his eyes.  He was slow to respond.  He was sitting and awake but started to shake all over, followed by loss of consciousness.  Otherwise, he does not demonstrate any convulsions.  His wife drove him to nearby fire station for vitals,  where his O2 sat was 68% on room air and 86% on O2.  He returned to the ED. Blood pressure was in the 90s/60s and pulse in the 40s.  He was given a loading dose of IV Depacon.   He continued to have spells in which he seems a little confused.  He does not demonstrate tongue biting or urinary/bowel incontinence.  Blood sugar has always been okay.  He was readmitted to the hospital on 03/29/16.  Long-term EEG was unremarkable.  Keppra was discontinued and he was started on gabapentin '300mg'$  three times daily.  He remained on Depakote.  72 hour EEG from 04/09/16 to 04/11/16 was normal but did not capture his habitual spells.  He had another spell on 04/14/16.  He was seen by pulmonology and cardiology, both did not suspect primary pulmonary or cardiac etiology.  Since then, he has been doing well.  He had taken himself off Depakote and gabapentin, as well as Cymbalta.  He has not had any other spells.  He has not had any apneic episodes during sleep.  He feels better.  His memory is better.  He was referred to the EMU at [facility] for further evaluation, as well as a sleep study, but he cancelled both since he had been feeling well.  Unclear if spells were central apneic.  He had a similar episode of confusion in 2008.  MRI of brain with and without contrast on 11/10/07 demonstrated the incidental developmental venous anomaly.  Episodes subsequently resolved and hasn't had another spell since 2017.  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   seizure free since 2017   Diverticulosis of colon    Essential hypertension    Generalized anxiety disorder     History of adenomatous polyp of colon 09/03/2012   tubual adenoma and hyperplastic   History of basal cell carcinoma excision 08/21/2014   scalp; also left lateral neck (02/15/2015)   History of COVID-19 06/2020   Hyperlipidemia    Hypomagnesemia 03/02/2016   Leukocytosis    Major depressive disorder 02/29/2016   Mild neurocognitive disorder, unclear etiology 09/27/2020   OA (osteoarthritis)    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     alfuzosin (UROXATRAL) 10 MG 24 hr tablet Take 10 mg by mouth daily.     atorvastatin (LIPITOR) 40 MG tablet Take 20 mg by mouth every morning. 1/2 tablet of 40 mg  Cholecalciferol (VITAMIN D-3) 1000 units CAPS Take 1 capsule by mouth every morning.     diltiazem (CARDIZEM) 60 MG tablet Take 60 mg by mouth 3 (three) times daily as needed.     donepezil (ARICEPT) 10 MG tablet TAKE 1 TABLET BY MOUTH AT BEDTIME (Patient taking differently: Take 10 mg by mouth at bedtime.) 30 tablet 5   finasteride (PROSCAR) 5 MG tablet Take 5 mg by mouth daily.     Multiple Vitamin (MULTIVITAMIN) tablet Take 1 tablet by mouth daily.     oxybutynin (DITROPAN XL) 15 MG 24 hr tablet Take 15 mg by mouth daily.     saw palmetto 160 MG capsule Take 160 mg by mouth daily.     solifenacin (VESICARE) 10 MG tablet Take 1 tablet by mouth daily.     tamsulosin (FLOMAX) 0.4 MG CAPS capsule Take 0.8 mg by mouth at bedtime.     Current Facility-Administered Medications on File Prior to Visit  Medication Dose Route Frequency Provider Last Rate Last Admin   0.9 %  sodium chloride infusion  500 mL Intravenous Continuous Pyrtle, Lajuan Lines, MD        ALLERGIES: Allergies  Allergen Reactions   Morphine And Related Nausea And  Vomiting   Duloxetine Hcl     Other reaction(s): Unknown    FAMILY HISTORY: Family History  Problem Relation Age of Onset   Alzheimer's disease Mother        early-onset presentation; late 40s/early 69s   Diabetes Father    Heart disease Father    Hypertension Father    Alzheimer's disease Maternal Aunt        symptom onset during late 70s/early 80s   Alzheimer's disease Maternal Grandmother        symptom onset during late 70s/early 80s   Colon cancer Neg Hx    Stomach cancer Neg Hx    Colon polyps Neg Hx    Esophageal cancer Neg Hx    Rectal cancer Neg Hx       Objective:  *** General: No acute distress.  Patient appears ***-groomed.   Head:  Normocephalic/atraumatic Eyes:  Fundi examined but not visualized Neck: supple, no paraspinal tenderness, full range of motion Heart:  Regular rate and rhythm Lungs:  Clear to auscultation bilaterally Back: No paraspinal tenderness Neurological Exam: alert and oriented to person, place, and time.  Speech fluent and not dysarthric, language intact.  CN II-XII intact. Bulk and tone normal, muscle strength 5/5 throughout.  Sensation to light touch intact.  Deep tendon reflexes 2+ throughout, toes downgoing.  Finger to nose testing intact.  Gait normal, Romberg negative.   Metta Clines, DO  CC: ***

## 2022-04-30 ENCOUNTER — Ambulatory Visit (INDEPENDENT_AMBULATORY_CARE_PROVIDER_SITE_OTHER): Payer: BC Managed Care – PPO | Admitting: Neurology

## 2022-04-30 ENCOUNTER — Encounter: Payer: Self-pay | Admitting: Neurology

## 2022-04-30 VITALS — BP 145/83 | HR 71 | Ht 72.0 in | Wt 205.0 lb

## 2022-04-30 DIAGNOSIS — G3184 Mild cognitive impairment, so stated: Secondary | ICD-10-CM | POA: Diagnosis not present

## 2022-05-20 DIAGNOSIS — D0362 Melanoma in situ of left upper limb, including shoulder: Secondary | ICD-10-CM | POA: Diagnosis not present

## 2022-05-20 DIAGNOSIS — D1801 Hemangioma of skin and subcutaneous tissue: Secondary | ICD-10-CM | POA: Diagnosis not present

## 2022-05-20 DIAGNOSIS — D225 Melanocytic nevi of trunk: Secondary | ICD-10-CM | POA: Diagnosis not present

## 2022-05-20 DIAGNOSIS — L570 Actinic keratosis: Secondary | ICD-10-CM | POA: Diagnosis not present

## 2022-05-20 DIAGNOSIS — L812 Freckles: Secondary | ICD-10-CM | POA: Diagnosis not present

## 2022-05-20 DIAGNOSIS — D045 Carcinoma in situ of skin of trunk: Secondary | ICD-10-CM | POA: Diagnosis not present

## 2022-05-20 DIAGNOSIS — L820 Inflamed seborrheic keratosis: Secondary | ICD-10-CM | POA: Diagnosis not present

## 2022-05-20 DIAGNOSIS — Z85828 Personal history of other malignant neoplasm of skin: Secondary | ICD-10-CM | POA: Diagnosis not present

## 2022-05-20 DIAGNOSIS — D485 Neoplasm of uncertain behavior of skin: Secondary | ICD-10-CM | POA: Diagnosis not present

## 2022-06-05 DIAGNOSIS — D0362 Melanoma in situ of left upper limb, including shoulder: Secondary | ICD-10-CM | POA: Diagnosis not present

## 2022-06-05 DIAGNOSIS — L988 Other specified disorders of the skin and subcutaneous tissue: Secondary | ICD-10-CM | POA: Diagnosis not present

## 2022-06-20 ENCOUNTER — Encounter: Payer: Self-pay | Admitting: Neurology

## 2022-07-11 ENCOUNTER — Ambulatory Visit: Payer: 59 | Admitting: Internal Medicine

## 2022-07-11 ENCOUNTER — Encounter: Payer: Self-pay | Admitting: Internal Medicine

## 2022-07-11 VITALS — BP 118/71 | HR 89 | Temp 97.1°F | Resp 16 | Ht 72.0 in | Wt 208.8 lb

## 2022-07-11 DIAGNOSIS — I10 Essential (primary) hypertension: Secondary | ICD-10-CM

## 2022-07-11 MED ORDER — AMIODARONE HCL 200 MG PO TABS
200.0000 mg | ORAL_TABLET | Freq: Two times a day (BID) | ORAL | 0 refills | Status: DC
Start: 2022-07-18 — End: 2022-07-28

## 2022-07-11 MED ORDER — AMIODARONE HCL 400 MG PO TABS: 400.0000 mg | ORAL_TABLET | Freq: Two times a day (BID) | ORAL | 0 refills | Status: DC

## 2022-07-11 MED ORDER — AMIODARONE HCL 200 MG PO TABS: 200.0000 mg | ORAL_TABLET | Freq: Every day | ORAL | 3 refills | Status: DC

## 2022-07-11 NOTE — Progress Notes (Signed)
Primary Physician/Referring:  [name], DO  Patient ID: Nathan Ballard, male    DOB: 1961-01-27, 61 y.o.   MRN: [id number]  Chief Complaint  Patient presents with   Atrial Fibrillation   Follow-up   HPI:    Nathan Ballard  is a 61 y.o. Caucasian white male with no significant prior cardiovascular history except for hypertension and hyperlipidemia which is well managed.  Patient does have family history of premature CAD (father with MI in 60s).  Presented with new onset atrial fibrillation 10/29/2021, with subsequent direct-current cardioversion as patient was symptomatic and hypotensive.  He reports persistent palpitations and he knows when he is in atrial fibrillation. He is in Afib on EKG today but relatively rate controlled however he always feels it when he is in afib. Patient agreeable to trying amiodarone and taking Eliquis for at least one month on this. He will call the office on Tuesday to let us know how he is doing and if persistent symptoms, will schedule DCCV. Today he denies chest pain, dyspnea, syncope, near syncope.     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   seizure free since 2017   Diverticulosis of colon    Essential hypertension    Generalized anxiety disorder    History of adenomatous polyp of colon 09/03/2012   tubual adenoma and hyperplastic   History of basal cell carcinoma excision 08/21/2014   scalp; also left lateral neck (02/15/2015)   History of COVID-19 06/2020   Hyperlipidemia    Hypomagnesemia 03/02/2016   Leukocytosis    Major depressive disorder 02/29/2016   Mild neurocognitive disorder,  unclear etiology 09/27/2020   OA (osteoarthritis)    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   Subcutaneous cyst    lower abdomen and right forearm   Past Surgical History:  Procedure Laterality Date   ANTERIOR CRUCIATE LIGAMENT REPAIR Left 1989; 2007   COLONOSCOPY     COLONOSCOPY WITH PROPOFOL  09/03/2012   KNEE ARTHROSCOPY WITH MEDIAL MENISECTOMY Right 09/19/2015   Procedure: RIGHT KNEE ARTHROSCOPY WITH PARTIAL MEDIAL MENISCECTOMY;  Surgeon: Leandrew Koyanagi, MD;  Location: [place];  Service: Orthopedics;  Laterality: Right;   LIPOMA EXCISION Right 05/01/2017   Procedure: EXCISION OF CYST FROM RIGHT FOREARM AND LOWER ABDOMEN;  Surgeon: Clovis Riley, MD;  Location: [place];  Service: General;  Laterality: Right;  site= right arm and abdomen   POLYPECTOMY     REMOVAL GREAT TOE NAIL Right 1993   SHOULDER SURGERY Right 1999   TRANSTHORACIC ECHOCARDIOGRAM  03/01/2016   GRADE 1 DIASTOLIC DYSFUNCTION/  EF 55-60%   WRIST GANGLION EXCISION Right 1998   Family History  Problem Relation Age of Onset   Alzheimer's disease Mother        early-onset presentation; late 40s/early 63s   Diabetes Father    Heart disease Father    Hypertension Father    Alzheimer's disease Maternal Aunt        symptom onset during late 70s/early 80s   Alzheimer's disease Maternal Grandmother        symptom onset during late 70s/early 80s   Colon cancer Neg Hx  Stomach cancer Neg Hx    Colon polyps Neg Hx    Esophageal cancer Neg Hx    Rectal cancer Neg Hx     Social History   Tobacco Use   Smoking status: Never   Smokeless tobacco: Never  Substance Use Topics   Alcohol use: Yes    Comment: rarely   Marital Status: Married   ROS  Review of Systems  Constitutional: Negative for malaise/fatigue and weight gain.  Cardiovascular:  Positive for irregular heartbeat and palpitations (rare). Negative for chest pain, claudication, leg swelling, near-syncope, orthopnea, paroxysmal  nocturnal dyspnea and syncope.       Now persistent palpitations  Respiratory:  Negative for shortness of breath.   Neurological:  Negative for light-headedness.    Objective  Blood pressure 118/71, pulse 89, temperature (!) 97.1 F (36.2 C), temperature source Temporal, resp. rate 16, height 6' (1.829 m), weight 208 lb 12.8 oz (94.7 kg), SpO2 97 %.     07/11/2022    9:01 AM 04/30/2022    2:42 PM 02/21/2022    8:51 AM  Vitals with BMI  Height '6\' 0"'$  '6\' 0"'$  '6\' 0"'$   Weight 208 lbs 13 oz 205 lbs 211 lbs  BMI 28.31 13.0 86.57  Systolic 846 962 952  Diastolic 71 83 82  Pulse 89 71 47      Physical Exam Vitals and nursing note reviewed.  Constitutional:      Appearance: Normal appearance.  HENT:     Head: Normocephalic and atraumatic.  Cardiovascular:     Rate and Rhythm: Normal rate. Rhythm irregular.     Pulses: Normal pulses and intact distal pulses.     Heart sounds: Normal heart sounds, S1 normal and S2 normal. No murmur heard.    No gallop.  Pulmonary:     Effort: Pulmonary effort is normal. No respiratory distress.     Breath sounds: No wheezing, rhonchi or rales.  Abdominal:     General: Abdomen is flat. Bowel sounds are normal.     Palpations: Abdomen is soft.  Musculoskeletal:     Right lower leg: No edema.     Left lower leg: No edema.  Neurological:     Mental Status: He is alert.    Laboratory examination:   Recent Labs    11/06/21 1621  NA 138  K 3.8  CL 105  CO2 24  GLUCOSE 112*  BUN 19  CREATININE 0.98  CALCIUM 8.9  GFRNONAA >60   CrCl cannot be calculated (Patient's most recent lab result is older than the maximum 21 days allowed.).     Latest Ref Rng & Units 11/06/2021    4:21 PM 05/01/2017    7:14 AM 03/31/2016    2:19 AM  CMP  Glucose 70 - 99 mg/dL 112  94  94   BUN 6 - 20 mg/dL 19   13   Creatinine 0.61 - 1.24 mg/dL 0.98   0.75   Sodium 135 - 145 mmol/L 138  143  140   Potassium 3.5 - 5.1 mmol/L 3.8  4.0  4.0   Chloride 98 - 111 mmol/L  105   106   CO2 22 - 32 mmol/L 24   28   Calcium 8.9 - 10.3 mg/dL 8.9   8.9   Total Protein 6.5 - 8.1 g/dL 6.3   5.7   Total Bilirubin 0.3 - 1.2 mg/dL 0.8   0.5   Alkaline Phos 38 - 126 U/L 53   43  AST 15 - 41 U/L 33   18   ALT 0 - 44 U/L 39   15       Latest Ref Rng & Units 11/06/2021    4:21 PM 05/01/2017    7:14 AM 03/31/2016    2:19 AM  CBC  WBC 4.0 - 10.5 K/uL 5.0   5.1   Hemoglobin 13.0 - 17.0 g/dL 14.8  13.3  13.3   Hematocrit 39.0 - 52.0 % 42.3  39.0  40.4   Platelets 150 - 400 K/uL 177   192     Lipid Panel No results for input(s): "CHOL", "TRIG", "LDLCALC", "VLDL", "HDL", "CHOLHDL", "LDLDIRECT" in the last 8760 hours.  HEMOGLOBIN A1C No results found for: "HGBA1C", "MPG" TSH No results for input(s): "TSH" in the last 8760 hours.  External labs:  08/28/2021: Total cholesterol 144, triglycerides 66, HDL 57, LDL 74.  Non-HDL cholesterol 87. TSH normal at 1.89.  Allergies   Allergies  Allergen Reactions   Morphine And Related Nausea And Vomiting   Duloxetine Hcl     Other reaction(s): Unknown    Medications Prior to Visit:   Outpatient Medications Prior to Visit  Medication Sig Dispense Refill   acetaminophen (TYLENOL) 500 MG tablet Take 500 mg by mouth every 6 (six) hours as needed for headache or mild pain.     alfuzosin (UROXATRAL) 10 MG 24 hr tablet Take 10 mg by mouth daily.     atorvastatin (LIPITOR) 40 MG tablet Take 20 mg by mouth every morning. 1/2 tablet of 40 mg     Cholecalciferol (VITAMIN D-3) 1000 units CAPS Take 1 capsule by mouth every morning.     diltiazem (CARDIZEM) 60 MG tablet Take 60 mg by mouth 3 (three) times daily as needed.     donepezil (ARICEPT) 10 MG tablet TAKE 1 TABLET BY MOUTH AT BEDTIME (Patient taking differently: Take 10 mg by mouth at bedtime.) 30 tablet 5   Multiple Vitamin (MULTIVITAMIN) tablet Take 1 tablet by mouth daily.     oxybutynin (DITROPAN XL) 15 MG 24 hr tablet Take 15 mg by mouth daily.     saw palmetto 160  MG capsule Take 160 mg by mouth daily.     solifenacin (VESICARE) 10 MG tablet Take 1 tablet by mouth daily.     tamsulosin (FLOMAX) 0.4 MG CAPS capsule Take 0.8 mg by mouth at bedtime.     0.9 %  sodium chloride infusion      No facility-administered medications prior to visit.   Final Medications at End of Visit    Current Meds  Medication Sig   acetaminophen (TYLENOL) 500 MG tablet Take 500 mg by mouth every 6 (six) hours as needed for headache or mild pain.   alfuzosin (UROXATRAL) 10 MG 24 hr tablet Take 10 mg by mouth daily.   [START ON 07/18/2022] amiodarone (PACERONE) 200 MG tablet Take 1 tablet (200 mg total) by mouth in the morning and at bedtime for 7 days.   [START ON 07/25/2022] amiodarone (PACERONE) 200 MG tablet Take 1 tablet (200 mg total) by mouth daily.   amiodarone (PACERONE) 400 MG tablet Take 1 tablet (400 mg total) by mouth in the morning and at bedtime for 7 days.   atorvastatin (LIPITOR) 40 MG tablet Take 20 mg by mouth every morning. 1/2 tablet of 40 mg   Cholecalciferol (VITAMIN D-3) 1000 units CAPS Take 1 capsule by mouth every morning.   diltiazem (CARDIZEM) 60 MG tablet Take 60 mg  by mouth 3 (three) times daily as needed.   donepezil (ARICEPT) 10 MG tablet TAKE 1 TABLET BY MOUTH AT BEDTIME (Patient taking differently: Take 10 mg by mouth at bedtime.)   Multiple Vitamin (MULTIVITAMIN) tablet Take 1 tablet by mouth daily.   oxybutynin (DITROPAN XL) 15 MG 24 hr tablet Take 15 mg by mouth daily.   saw palmetto 160 MG capsule Take 160 mg by mouth daily.   solifenacin (VESICARE) 10 MG tablet Take 1 tablet by mouth daily.   tamsulosin (FLOMAX) 0.4 MG CAPS capsule Take 0.8 mg by mouth at bedtime.   Radiology:   No results found.  Cardiac Studies:  Ambulatory cardiac telemetry 14 days (01/10/2022 - 01/24/2022): Predominant underlying rhythm was sinus.  2 episodes of SVT which were brief and asymptomatic.  Patient's symptoms correlated with rare PACs and rare PVCs.   Patient did have single sinus pause lasting 3.3 seconds at 3 AM 01/22/2022 which was asymptomatic.  No evidence of ventricular tachycardia, atrial fibrillation, high degree AV block.   PCV MYOCARDIAL PERFUSION WO LEXISCAN 12/04/2021 Normal ECG stress. The patient exercised for 6 minutes and 36 seconds of a Bruce protocol, achieving approximately 7.95 METs.  The heart rate response was normal. The blood pressure response was physiologic. No symptoms reported. Myocardial perfusion is normal. Overall LV systolic function is normal without regional wall motion abnormalities. Stress LV EF: 60%. No previous exam available for comparison. Low risk.  PCV ECHOCARDIOGRAM COMPLETE 85/46/2703 Normal LV systolic function with visual EF 60-65%. Left ventricle cavity is normal in size. Normal left ventricular wall thickness. Normal global wall motion. Normal diastolic filling pattern, normal LAP. Mild tricuspid regurgitation. No evidence of pulmonary hypertension. Compared to study 09/18/2015 no significant change.   Direct current cardioversion 11/06/2021 7:04 PM Indication symptomatic A. Fibrillation  Post cardioversion EKG 11/06/2021: Normal sinus rhythm without evidence of ischemia.  EKG:   07/11/22: afib, rate 99bpm  02/21/2022: Sinus bradycardia at a rate of 50 bpm.  Normal axis.  No evidence of ischemia or underlying injury pattern.  Compared EKG 01/10/2022, no significant change.  Post cardioversion EKG 11/06/2021: Normal sinus rhythm without evidence of ischemia.  11/06/2021: Atrial fibrillation with rapid ventricular sponsor rate of: 44 bpm, normal axis, diffuse nonspecific ST-T abnormality.  Compared to 05/01/2017, normal sinus rhythm.  Assessment     ICD-10-CM   1. Essential hypertension  I10 EKG 12-Lead       Medications Discontinued During This Encounter  Medication Reason   0.9 %  sodium chloride infusion     Meds ordered this encounter  Medications   amiodarone (PACERONE) 400 MG  tablet    Sig: Take 1 tablet (400 mg total) by mouth in the morning and at bedtime for 7 days.    Dispense:  14 tablet    Refill:  0   amiodarone (PACERONE) 200 MG tablet    Sig: Take 1 tablet (200 mg total) by mouth in the morning and at bedtime for 7 days.    Dispense:  14 tablet    Refill:  0   amiodarone (PACERONE) 200 MG tablet    Sig: Take 1 tablet (200 mg total) by mouth daily.    Dispense:  30 tablet    Refill:  3   CHA2DS2-VASc Score is 1.  Yearly risk of stroke: 1.3% (HTN).   Recommendations:   Nathan Ballard is a 61 y.o. Caucasian white male with no significant prior cardiovascular history except for hypertension and hyperlipidemia which is  well managed.  Presented with new onset atrial fibrillation to [facility] emergency department 11/06/2021, underwent direct-current cardioversion as he was symptomatic and hypotensive.  Patient is back in Afib CVR today but quite symptomatic now and when he is exerting himself Will initiate amiodarone to be taken as follows: '400mg'$  BID for 1 week, then '200mg'$  BID for 1 week, then '200mg'$  daily thereafter Patient to call the office on Tuesday 9/5 to report how he is tolerating amiodarone If still symptomatic, will schedule DCCV at the hospital as outpatient procedure Take Eliquis '5mg'$  BID for at least 1 month while initiating amiodarone Patient states he has a few months left of Eliquis pills at home Encourage low-sodium diet, less than 2000 mg daily.    Follow-up in 4 weeks, sooner if needed.   Floydene Flock, DO, [facility] 07/11/2022, 10:47 AM Office: [phone]

## 2022-07-22 ENCOUNTER — Encounter (HOSPITAL_COMMUNITY): Payer: Self-pay | Admitting: Cardiology

## 2022-07-25 ENCOUNTER — Telehealth: Payer: Self-pay

## 2022-07-27 NOTE — Telephone Encounter (Signed)
I sent you secure message. Bring patient for an EKG Monday

## 2022-07-28 ENCOUNTER — Ambulatory Visit: Payer: BC Managed Care – PPO | Admitting: Cardiology

## 2022-07-28 VITALS — BP 137/76 | HR 67 | Temp 97.8°F | Resp 16 | Ht 72.0 in | Wt 203.0 lb

## 2022-07-28 DIAGNOSIS — E782 Mixed hyperlipidemia: Secondary | ICD-10-CM | POA: Diagnosis not present

## 2022-07-28 DIAGNOSIS — I480 Paroxysmal atrial fibrillation: Secondary | ICD-10-CM

## 2022-07-28 MED ORDER — FLECAINIDE ACETATE 150 MG PO TABS: 150.0000 mg | ORAL_TABLET | Freq: Once | ORAL | 1 refills | Status: DC | PRN

## 2022-07-28 NOTE — Telephone Encounter (Signed)
Patient has been scheduled to come in

## 2022-07-28 NOTE — Progress Notes (Signed)
Primary Physician/Referring:  [name], DO  Patient ID: Nathan Ballard, male    DOB: [birth year], 61 y.o.   MRN: [id number]  Chief Complaint  Patient presents with   Atrial Fibrillation   HPI:    QUIENTIN JENT  is a 61 y.o. Male patient with hyperlipidemia, family history of premature coronary disease with father having had MI in his mid 28s, had new onset atrial fibrillation in December 2022 and had cardioversion at that time.  He has been negative for OSA.  He had been doing well and presented with recurrent atrial fibrillation on 07/11/2022, 2 days after he had played pickle ball and felt rapid heartbeat.  He was started on amiodarone on 07/11/2022 and was scheduled for cardioversion.  2 weeks later along with starting him on Eliquis.  However patient called [place] 3 days later and stated that he is back into regular rhythm.  As it was weekend.  I brought him here today.  On Monday for repeat EKG.  He remains asymptomatic except for feeling that his heart is beating harder but overall feels well and feels that his A-fib is gone.  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   seizure free since 2017   Diverticulosis of colon    Generalized anxiety disorder    History of adenomatous polyp of colon 09/03/2012   tubual adenoma and hyperplastic   History of basal cell carcinoma excision 08/21/2014   scalp; also left lateral neck (02/15/2015)   History of COVID-19 06/2020   Hyperlipidemia    Hypomagnesemia 03/02/2016   Leukocytosis    Major depressive disorder 02/29/2016   Mild neurocognitive disorder, unclear etiology 09/27/2020   OA  (osteoarthritis)    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   Subcutaneous cyst    lower abdomen and right forearm   Past Surgical History:  Procedure Laterality Date   ANTERIOR CRUCIATE LIGAMENT REPAIR Left 1989; 2007   COLONOSCOPY     COLONOSCOPY WITH PROPOFOL  09/03/2012   KNEE ARTHROSCOPY WITH MEDIAL MENISECTOMY Right 09/19/2015   Procedure: RIGHT KNEE ARTHROSCOPY WITH PARTIAL MEDIAL MENISCECTOMY;  Surgeon: Leandrew Koyanagi, MD;  Location: [facility];  Service: Orthopedics;  Laterality: Right;   LIPOMA EXCISION Right 05/01/2017   Procedure: EXCISION OF CYST FROM RIGHT FOREARM AND LOWER ABDOMEN;  Surgeon: Clovis Riley, MD;  Location: [facility];  Service: General;  Laterality: Right;  site= right arm and abdomen   POLYPECTOMY     REMOVAL GREAT TOE NAIL Right 1993   SHOULDER SURGERY Right 1999   TRANSTHORACIC ECHOCARDIOGRAM  03/01/2016   GRADE 1 DIASTOLIC DYSFUNCTION/  EF 55-60%   WRIST GANGLION EXCISION Right 1998   Family History  Problem Relation Age of Onset   Alzheimer's disease Mother        early-onset presentation; late 40s/early 62s   Diabetes Father    Heart disease Father    Hypertension Father    Alzheimer's disease Maternal Aunt        symptom onset during late 70s/early 80s   Alzheimer's disease Maternal Grandmother        symptom onset during late 70s/early 80s   Colon cancer  Neg Hx    Stomach cancer Neg Hx    Colon polyps Neg Hx    Esophageal cancer Neg Hx    Rectal cancer Neg Hx     Social History   Tobacco Use   Smoking status: Never   Smokeless tobacco: Never  Substance Use Topics   Alcohol use: Yes    Comment: rarely   Marital Status: Married  ROS  ROS Objective      07/28/2022   10:23 AM 07/11/2022    9:01 AM 04/30/2022    2:42 PM  Vitals with BMI  Height 6' 0"  6' 0"  6' 0"   Weight 203 lbs 208 lbs 13 oz 205 lbs  BMI 27.53 62.83 15.1  Systolic 761 607 371  Diastolic 76 71 83  Pulse 67 89 71   Today's Vitals    07/28/22 1023  BP: 137/76  Pulse: 67  Resp: 16  Temp: 97.8 F (36.6 C)  TempSrc: Temporal  SpO2: 97%  Weight: 203 lb (92.1 kg)  Height: 6' (1.829 m)   Body mass index is 27.53 kg/m.   Physical Exam  Medications and allergies   Allergies  Allergen Reactions   Morphine And Related Nausea And Vomiting   Duloxetine Hcl Other (See Comments)    Other reaction(s): Unknown     Medication list after today's encounter   Current Outpatient Medications:    acetaminophen (TYLENOL) 500 MG tablet, Take 500 mg by mouth every 6 (six) hours as needed for headache or mild pain., Disp: , Rfl:    alfuzosin (UROXATRAL) 10 MG 24 hr tablet, Take 10 mg by mouth daily., Disp: , Rfl:    atorvastatin (LIPITOR) 40 MG tablet, Take 20 mg by mouth daily., Disp: , Rfl:    Biotin 10000 MCG TABS, Take 10,000 mcg by mouth daily., Disp: , Rfl:    Cholecalciferol (VITAMIN D) 50 MCG (2000 UT) tablet, Take 2,000 Units by mouth daily., Disp: , Rfl:    diltiazem (CARDIZEM) 60 MG tablet, Take 60 mg by mouth 3 (three) times daily as needed (HR above 125)., Disp: , Rfl:    donepezil (ARICEPT) 10 MG tablet, TAKE 1 TABLET BY MOUTH AT BEDTIME (Patient taking differently: Take 10 mg by mouth at bedtime.), Disp: 30 tablet, Rfl: 5   finasteride (PROSCAR) 5 MG tablet, Take 5 mg by mouth daily., Disp: , Rfl:    flecainide (TAMBOCOR) 150 MG tablet, Take 1 tablet (150 mg total) by mouth once as needed for up to 1 dose (Onset of atrial fibrillation)., Disp: 20 tablet, Rfl: 1   Multiple Vitamin (MULTIVITAMIN) tablet, Take 3 tablets by mouth daily. Living Green, Disp: , Rfl:    Omega-3 1000 MG CAPS, Take 1,000 mg by mouth daily., Disp: , Rfl:    OVER THE COUNTER MEDICATION, Take 1 capsule by mouth daily. Neuriva, Disp: , Rfl:    OVER THE COUNTER MEDICATION, Take 2 capsules by mouth daily. Alpha Brain, Disp: , Rfl:    oxybutynin (DITROPAN XL) 15 MG 24 hr tablet, Take 15 mg by mouth daily., Disp: , Rfl:    SAW PALMETTO, SERENOA  REPENS, PO, Take 2 capsules by mouth daily., Disp: , Rfl:    tamsulosin (FLOMAX) 0.4 MG CAPS capsule, Take 0.8 mg by mouth at bedtime., Disp: , Rfl:    TURMERIC CURCUMIN PO, Take 3 capsules by mouth daily., Disp: , Rfl:   Laboratory examination:   Lab Results  Component Value Date   NA 138 11/06/2021   K 3.8  11/06/2021   CO2 24 11/06/2021   GLUCOSE 112 (H) 11/06/2021   BUN 19 11/06/2021   CREATININE 0.98 11/06/2021   CALCIUM 8.9 11/06/2021   GFRNONAA >60 11/06/2021       Latest Ref Rng & Units 11/06/2021    4:21 PM 05/01/2017    7:14 AM 03/31/2016    2:19 AM  BMP  Glucose 70 - 99 mg/dL 112  94  94   BUN 6 - 20 mg/dL 19   13   Creatinine 0.61 - 1.24 mg/dL 0.98   0.75   Sodium 135 - 145 mmol/L 138  143  140   Potassium 3.5 - 5.1 mmol/L 3.8  4.0  4.0   Chloride 98 - 111 mmol/L 105   106   CO2 22 - 32 mmol/L 24   28   Calcium 8.9 - 10.3 mg/dL 8.9   8.9       Latest Ref Rng & Units 11/06/2021    4:21 PM 03/31/2016    2:19 AM 03/28/2016   10:15 AM  Hepatic Function  Total Protein 6.5 - 8.1 g/dL 6.3  5.7  6.6   Albumin 3.5 - 5.0 g/dL 3.9  3.7  4.6   AST 15 - 41 U/L 33  18  18   ALT 0 - 44 U/L 39  15  17   Alk Phosphatase 38 - 126 U/L 53  43  46   Total Bilirubin 0.3 - 1.2 mg/dL 0.8  0.5  0.6     External labs:   NA  Radiology:    Cardiac Studies:   PCV ECHOCARDIOGRAM COMPLETE 12/04/2021  Narrative Echocardiogram 12/04/2021: Normal LV systolic function with visual EF 60-65%. Left ventricle cavity is normal in size. Normal left ventricular wall thickness. Normal global wall motion. Normal diastolic filling pattern, normal LAP. Mild tricuspid regurgitation. No evidence of pulmonary hypertension. Compared to study 09/18/2015 no significant change.     PCV MYOCARDIAL PERFUSION WO LEXISCAN 12/04/2021  Narrative Exercise nuclear stress test 12/04/2020: Normal ECG stress. The patient exercised for 6 minutes and 36 seconds of a Bruce protocol, achieving approximately  7.95 METs.  The heart rate response was normal. The blood pressure response was physiologic. No symptoms reported. Myocardial perfusion is normal. Overall LV systolic function is normal without regional wall motion abnormalities. Stress LV EF: 60%. No previous exam available for comparison. Low risk.   Ambulatory cardiac telemetry 14 days (01/10/2022 - 01/24/2022): Predominant underlying rhythm was sinus.  2 episodes of SVT which were brief and asymptomatic.  Patient's symptoms correlated with rare PACs and rare PVCs.  Patient did have single sinus pause lasting 3.3 seconds at 3 AM 01/22/2022 which was asymptomatic.  No evidence of ventricular tachycardia, atrial fibrillation, high degree AV block.    Direct current cardioversion 11/06/2021 7:04 PM Indication symptomatic A. Fibrillation  Post cardioversion EKG 11/06/2021: Normal sinus rhythm without evidence of ischemia.  EKG:   EKG 07/28/2022: Normal sinus rhythm at rate of 72 bpm, normal axis.  No evidence of ischemia, normal EKG.    EKG 07/11/2022: Atrial fibrillation with rapid ventricular sponsor rate of 100 bpm, no evidence of ischemia.  Normal QT interval.  Assessment     ICD-10-CM   1. Paroxysmal atrial fibrillation (HCC)  I48.0 EKG 12-Lead    flecainide (TAMBOCOR) 150 MG tablet      Orders Placed This Encounter  Procedures   EKG 12-Lead    Meds ordered this encounter  Medications   flecainide (TAMBOCOR) 150  MG tablet    Sig: Take 1 tablet (150 mg total) by mouth once as needed for up to 1 dose (Onset of atrial fibrillation).    Dispense:  20 tablet    Refill:  1   Medications Discontinued During This Encounter  Medication Reason   amiodarone (PACERONE) 200 MG tablet    amiodarone (PACERONE) 400 MG tablet    amiodarone (PACERONE) 200 MG tablet Discontinued by provider   apixaban (ELIQUIS) 5 MG TABS tablet No longer needed (for PRN medications)     Recommendations:   RISHIK TUBBY is a 61 y.o.  Male patient with  hyperlipidemia, family history of premature coronary disease with father having had MI in his mid 32s, had new onset atrial fibrillation in December 2022 and had cardioversion at that time.  He has been negative for OSA.  He had been doing well and presented with recurrent atrial fibrillation on 07/11/2022, he was started on amiodarone on 07/11/2022 and was scheduled for cardioversion.  2 weeks later along with starting him on Eliquis.  However patient called [place] 2 days later and said he is back in sinus rhythm.  I brought him in for EKG today and which reveals sinus rhythm.  Patient has taken a total of approximately 1 week amiodarone.  He converted to sinus rhythm within 2 days after starting the medication hence I do not think it is the amiodarone effect but patient spontaneously converted to sinus rhythm.  Advised him to discontinue amiodarone, advised him that he can use flecainide 150 mg 2 tablets with "pill in the pocket" approach, but not to use flecainide for at least 2 weeks since he was on amiodarone for a week although he is not fully loaded with amiodarone.  For now advised him that if he has recurrence to call us so we can give flecainide at her office and observe him.  However, after 30 days, he could certainly try as needed flecainide at any point.  He can continue to use diltiazem rapid acting on a as needed basis.  His CHA2DS2-VASc risk or is 0-1.  Does not need anticoagulation.  He is advised to take Eliquis for additional 2 weeks and discontinue this.  I would like to see him back on an annual basis.      Adrian Prows, MD, [facility] 07/28/2022, 10:55 AM Office: [phone]

## 2022-07-29 ENCOUNTER — Ambulatory Visit (HOSPITAL_COMMUNITY)
Admission: RE | Admit: 2022-07-29 | Payer: Managed Care, Other (non HMO) | Source: Home / Self Care | Admitting: Cardiology

## 2022-07-29 SURGERY — CARDIOVERSION
Anesthesia: Monitor Anesthesia Care

## 2022-08-06 ENCOUNTER — Ambulatory Visit: Payer: 59 | Admitting: Internal Medicine

## 2022-08-11 ENCOUNTER — Ambulatory Visit: Payer: 59

## 2022-08-25 ENCOUNTER — Ambulatory Visit: Payer: 59 | Admitting: Student

## 2022-09-01 DIAGNOSIS — I10 Essential (primary) hypertension: Secondary | ICD-10-CM | POA: Diagnosis not present

## 2022-09-01 DIAGNOSIS — Z125 Encounter for screening for malignant neoplasm of prostate: Secondary | ICD-10-CM | POA: Diagnosis not present

## 2022-09-01 DIAGNOSIS — E7800 Pure hypercholesterolemia, unspecified: Secondary | ICD-10-CM | POA: Diagnosis not present

## 2022-09-01 DIAGNOSIS — Z0001 Encounter for general adult medical examination with abnormal findings: Secondary | ICD-10-CM | POA: Diagnosis not present

## 2022-09-01 DIAGNOSIS — E039 Hypothyroidism, unspecified: Secondary | ICD-10-CM | POA: Diagnosis not present

## 2022-09-09 ENCOUNTER — Encounter: Payer: Self-pay | Admitting: Internal Medicine

## 2022-09-29 ENCOUNTER — Telehealth: Payer: Self-pay

## 2022-09-29 NOTE — Telephone Encounter (Signed)
Dottie,  Please obtain cardiology clearance for this patient prior to his procedure that is scheduled on 10/30/2022, with the Pre Visit on 10/06/2022.    I called and spoke with him and  He reports had recently been placed on Eliquis for a dx of AFIB, but reports he is no longer on Eliquis as of 07/2022.  Thank you

## 2022-09-29 NOTE — Telephone Encounter (Signed)
Dr Hilarie Fredrickson, please see patient's cardiology note from 07/28/22. Does he need to be seen in our office first before having procedure? It appears he is no longer in need of Eliquis though he did have some atrial fibrillation recently.

## 2022-09-30 NOTE — Telephone Encounter (Signed)
Cards note reviewed He is off [place] He can proceed with colonoscopy as scheduled without need for office visit Should he have recurrent afib before his procedure he needs to let us know JMP

## 2022-10-01 ENCOUNTER — Ambulatory Visit
Admission: EM | Admit: 2022-10-01 | Discharge: 2022-10-01 | Disposition: A | Discharge status: Home / Self Care | Payer: Managed Care, Other (non HMO) | Attending: Internal Medicine | Admitting: Internal Medicine

## 2022-10-01 DIAGNOSIS — M25512 Pain in left shoulder: Secondary | ICD-10-CM | POA: Diagnosis not present

## 2022-10-01 DIAGNOSIS — T148XXA Other injury of unspecified body region, initial encounter: Secondary | ICD-10-CM

## 2022-10-01 MED ORDER — CYCLOBENZAPRINE HCL 5 MG PO TABS: 5.0000 mg | ORAL_TABLET | Freq: Two times a day (BID) | ORAL | 0 refills | Status: DC | PRN

## 2022-10-01 NOTE — Discharge Instructions (Signed)
I have prescribed a muscle relaxer to take as needed.  Please do not take it with any other sedating medications.  This can make you sleepy so do not drive or drink alcohol with taking this.  Follow-up with orthopedist if symptoms persist or worsen.

## 2022-10-01 NOTE — ED Triage Notes (Signed)
Pt c/o left shoulder pain onset ~ yesterday states it feels like a muscle spasm. Denies direct trauma/injury but says he was doing some furniture moving Saturday.

## 2022-10-01 NOTE — ED Provider Notes (Addendum)
[facility]    CSN: [id number] Arrival date & time: 10/01/22  7741      History   Chief Complaint Chief Complaint  Patient presents with   left shoulder pain    HPI Nathan Ballard is a 61 y.o. male.   Patient presents with left shoulder pain that extends into left shoulder blade that started yesterday.  Patient reports that he was playing pickle ball over the weekend as well as moving and lifting heavy tables.  Although, denies any obvious injury.  He denies any numbness or tingling.  Movement exacerbates pain.  He has taken ibuprofen and Tylenol with minimal improvement.  Denies history of chronic shoulder pain in that area.  Patient has elevated blood pressure reading.  Denies chest pain, shortness of breath, headache, dizziness, nausea, vomiting.     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   seizure free since 2017   Diverticulosis of colon    Generalized anxiety disorder    History of adenomatous polyp of colon 09/03/2012   tubual adenoma and hyperplastic   History of basal cell carcinoma excision 08/21/2014   scalp; also left lateral neck (02/15/2015)   History of COVID-19 06/2020   Hyperlipidemia    Hypomagnesemia 03/02/2016   Leukocytosis    Major depressive disorder 02/29/2016   Mild neurocognitive disorder, unclear etiology 09/27/2020   OA (osteoarthritis)    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   Subcutaneous cyst    lower abdomen and right forearm    Patient Active Problem List   Diagnosis Date Noted   Mild neurocognitive disorder, unclear etiology 09/27/2020   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   BPH  with obstruction/lower urinary tract symptoms 02/24/2019   Arteriovenous malformation of precerebral vessels    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   Hypomagnesemia 03/02/2016   Major depressive disorder 02/29/2016   Hyperlipidemia    Generalized anxiety disorder    Arthritis of carpometacarpal ([facility]) joint of left thumb     Past Surgical History:  Procedure Laterality Date   ANTERIOR CRUCIATE LIGAMENT REPAIR Left 1989; 2007   COLONOSCOPY     COLONOSCOPY WITH PROPOFOL  09/03/2012   KNEE ARTHROSCOPY WITH MEDIAL MENISECTOMY Right 09/19/2015   Procedure: RIGHT KNEE ARTHROSCOPY WITH PARTIAL MEDIAL MENISCECTOMY;  Surgeon: Leandrew Koyanagi, MD;  Location: [facility];  Service: Orthopedics;  Laterality: Right;   LIPOMA EXCISION Right 05/01/2017   Procedure: EXCISION OF CYST FROM RIGHT FOREARM AND LOWER ABDOMEN;  Surgeon: Clovis Riley, MD;  Location: [facility];  Service: General;  Laterality: Right;  site= right arm and abdomen   POLYPECTOMY     REMOVAL GREAT TOE NAIL Right 1993   SHOULDER SURGERY Right 1999   TRANSTHORACIC ECHOCARDIOGRAM  03/01/2016   GRADE 1 DIASTOLIC DYSFUNCTION/  EF 55-60%   WRIST GANGLION EXCISION Right 1998       Home Medications    Prior to Admission medications   Medication Sig Start Date End Date Taking? Authorizing Provider  cyclobenzaprine (FLEXERIL) 5 MG tablet Take 1 tablet (5 mg total) by mouth 2 (two) times daily as needed for muscle spasms. 10/01/22  Yes , Michele Rockers, FNP  acetaminophen (TYLENOL)  500 MG tablet Take 500 mg by mouth every 6 (six) hours as needed for headache or mild pain.    [provider]  alfuzosin (UROXATRAL) 10 MG 24 hr tablet Take 10 mg by mouth daily. 07/16/21   [provider]  atorvastatin (LIPITOR) 40 MG tablet Take 20 mg by mouth daily. 08/18/12   [provider]  Biotin 10000 MCG TABS Take 10,000 mcg by mouth daily.    [provider]  Cholecalciferol (VITAMIN D)  50 MCG (2000 UT) tablet Take 2,000 Units by mouth daily.    [provider]  diltiazem (CARDIZEM) 60 MG tablet Take 60 mg by mouth 3 (three) times daily as needed (HR above 125). 01/15/22   [provider]  donepezil (ARICEPT) 10 MG tablet TAKE 1 TABLET BY MOUTH AT BEDTIME Patient taking differently: Take 10 mg by mouth at bedtime. 05/31/21   Pieter Partridge, DO  finasteride (PROSCAR) 5 MG tablet Take 5 mg by mouth daily.    [provider]  flecainide (TAMBOCOR) 150 MG tablet Take 1 tablet (150 mg total) by mouth once as needed for up to 1 dose (Onset of atrial fibrillation). 07/28/22   Adrian Prows, MD  Multiple Vitamin (MULTIVITAMIN) tablet Take 3 tablets by mouth daily. Living Green    [provider]  Omega-3 1000 MG CAPS Take 1,000 mg by mouth daily.    [provider]  OVER THE COUNTER MEDICATION Take 1 capsule by mouth daily. Neuriva    [provider]  OVER THE COUNTER MEDICATION Take 2 capsules by mouth daily. Alpha Brain    [provider]  oxybutynin (DITROPAN XL) 15 MG 24 hr tablet Take 15 mg by mouth daily. 11/05/21   [provider]  SAW PALMETTO, SERENOA REPENS, PO Take 2 capsules by mouth daily.    [provider]  tamsulosin (FLOMAX) 0.4 MG CAPS capsule Take 0.8 mg by mouth at bedtime. 11/05/21   [provider]  TURMERIC CURCUMIN PO Take 3 capsules by mouth daily.    [provider]    Family History Family History  Problem Relation Age of Onset   Alzheimer's disease Mother        early-onset presentation; late 40s/early 48s   Diabetes Father    Heart disease Father    Hypertension Father    Alzheimer's disease Maternal Aunt        symptom onset during late 70s/early 23s   Alzheimer's disease Maternal Grandmother        symptom onset during late 70s/early 80s   Colon cancer Neg Hx    Stomach cancer Neg Hx    Colon polyps Neg Hx    Esophageal cancer Neg Hx    Rectal cancer Neg  Hx     Social History Social History   Tobacco Use   Smoking status: Never   Smokeless tobacco: Never  Vaping Use   Vaping Use: Never used  Substance Use Topics   Alcohol use: Yes    Comment: rarely   Drug use: No     Allergies   Morphine and related and Duloxetine hcl   Review of Systems Review of Systems Per HPI  Physical Exam Triage Vital Signs ED Triage Vitals [10/01/22 1036]  Enc Vitals Group     BP (!) 165/80     Pulse Rate 70     Resp 16     Temp 98 F (36.7 C)     Temp Source Oral  SpO2 96 %     Weight      Height      Head Circumference      Peak Flow      Pain Score 7     Pain Loc      Pain Edu?      Excl. in [place]?    No data found.  Updated Vital Signs BP (!) 165/80 (BP Location: Right Arm)   Pulse 70   Temp 98 F (36.7 C) (Oral)   Resp 16   SpO2 96%   Visual Acuity Right Eye Distance:   Left Eye Distance:   Bilateral Distance:    Right Eye Near:   Left Eye Near:    Bilateral Near:     Physical Exam Constitutional:      General: He is not in acute distress.    Appearance: Normal appearance. He is not toxic-appearing or diaphoretic.  HENT:     Head: Normocephalic and atraumatic.  Eyes:     Extraocular Movements: Extraocular movements intact.     Conjunctiva/sclera: Conjunctivae normal.  Cardiovascular:     Rate and Rhythm: Normal rate and regular rhythm.     Pulses: Normal pulses.     Heart sounds: Normal heart sounds.  Pulmonary:     Effort: Pulmonary effort is normal. No respiratory distress.     Breath sounds: Normal breath sounds.  Musculoskeletal:     Comments: Tenderness to palpation to the lateral shoulder as well as posterior shoulder that extends slightly into the left upper thoracic back.  No obvious swelling, discoloration, lacerations, abrasions noted.  Patient has pain with abduction of arm at about 45 degrees.  Grip strength is 5/5.  Neurovascular intact.  No direct bony tenderness.  No crepitus noted.   Neurological:     General: No focal deficit present.     Mental Status: He is alert and oriented to person, place, and time. Mental status is at baseline.     Cranial Nerves: Cranial nerves 2-12 are intact.     Sensory: Sensation is intact.     Motor: Motor function is intact.     Coordination: Coordination is intact.     Gait: Gait is intact.  Psychiatric:        Mood and Affect: Mood normal.        Behavior: Behavior normal.        Thought Content: Thought content normal.        Judgment: Judgment normal.      UC Treatments / Results  Labs (all labs ordered are listed, but only abnormal results are displayed) Labs Reviewed - No data to display  EKG   Radiology No results found.  Procedures Procedures (including critical care time)  Medications Ordered in UC Medications - No data to display  Initial Impression / Assessment and Plan / UC Course  I have reviewed the triage vital signs and the nursing notes.  Pertinent labs & imaging results that were available during my care of the patient were reviewed by me and considered in my medical decision making (see chart for details).     Physical exam is consistent with muscular strain/injury.  No concern for bony abnormality so imaging was deferred.  I do think that patient would benefit from muscle relaxer, although discussed risks with patient.  Patient does have a history of seizures with last seizure being approximately 10 years ago.  Patient reports that he does not take any daily antiepileptics.  Discussed with  patient that there is a risk with muscle relaxers and having a history of seizure and patient voiced understanding.  Patient would like to proceed with muscle relaxer despite risks and voiced understanding of risk.  Advised patient that muscle relaxer can cause drowsiness and do not drive or drink alcohol with taking it.  Patient voiced understanding.  Advised patient of safe over-the-counter pain relievers to  supplement with as well.  Discussed alternating ice and heat to affected area.  Patient has established orthopedist and encouraged patient to follow-up with them if symptoms persist or worsen.  Patient denies that he takes any other obvious sedating medications so this should be safe.   Patient has elevated blood pressure reading but suspect pain is attributing to this.  Patient advised to monitor blood pressure at home.  Patient is asymptomatic with blood pressure and no signs of hypertensive urgency.  therefore, do not think that emergent evaluation is necessary.  Patient verbalized understanding and was agreeable with plan. Final Clinical Impressions(s) / UC Diagnoses   Final diagnoses:  Acute pain of left shoulder  Muscle strain     Discharge Instructions      I have prescribed a muscle relaxer to take as needed.  Please do not take it with any other sedating medications.  This can make you sleepy so do not drive or drink alcohol with taking this.  Follow-up with orthopedist if symptoms persist or worsen.    ED Prescriptions     Medication Sig Dispense Auth. Provider   cyclobenzaprine (FLEXERIL) 5 MG tablet Take 1 tablet (5 mg total) by mouth 2 (two) times daily as needed for muscle spasms. 20 tablet [place], [place], [place]      I have reviewed the PDMP during this encounter.   Teodora Medici, [place] 10/01/22 [place], [place], [place] 10/01/22 [place], [place], [place] 10/01/22 1142

## 2022-10-06 ENCOUNTER — Ambulatory Visit (AMBULATORY_SURGERY_CENTER): Payer: Self-pay

## 2022-10-06 ENCOUNTER — Other Ambulatory Visit: Payer: Self-pay

## 2022-10-06 VITALS — Ht 72.0 in | Wt 211.0 lb

## 2022-10-06 DIAGNOSIS — Z86010 Personal history of colonic polyps: Secondary | ICD-10-CM

## 2022-10-06 MED ORDER — NA SULFATE-K SULFATE-MG SULF 17.5-3.13-1.6 GM/177ML PO SOLN: 1.0000 | Freq: Once | ORAL | 0 refills | Status: AC

## 2022-10-06 NOTE — Progress Notes (Signed)
Denies allergies to eggs or soy products. Denies complication of anesthesia or sedation. Denies use of weight loss medication. Denies use of O2.   [facility]i instructions given for colonoscopy.  

## 2022-10-06 NOTE — Telephone Encounter (Signed)
Noted on PV chart ?

## 2022-10-10 ENCOUNTER — Ambulatory Visit: Payer: Managed Care, Other (non HMO) | Admitting: Orthopaedic Surgery

## 2022-10-10 ENCOUNTER — Ambulatory Visit (INDEPENDENT_AMBULATORY_CARE_PROVIDER_SITE_OTHER): Payer: Managed Care, Other (non HMO)

## 2022-10-10 DIAGNOSIS — M79672 Pain in left foot: Secondary | ICD-10-CM

## 2022-10-10 MED ORDER — TRAMADOL HCL 50 MG PO TABS: 50.0000 mg | ORAL_TABLET | Freq: Two times a day (BID) | ORAL | 2 refills | Status: DC | PRN

## 2022-10-10 NOTE — Progress Notes (Unsigned)
Office Visit Note   Patient: Nathan Ballard           Date of Birth: [birth year]           MRN: [id number] Visit Date: 10/10/2022              Requested by: [name], DO [address] [place],  [place] [id number] PCP: [name], DO   Assessment & Plan: Visit Diagnoses:  1. Pain in left foot     Plan: Impression is left lateral foot pain to the base of the fifth metatarsal with overlying callus formation.  At this point, not concerned for fracture or infection.  His symptoms started prior to callus formation however these have worsened since developing a callus.  I would like to make referral to Dr. Sharol Given for further evaluation in addition to probable trimming of the callus.  We will call with concerns or questions in the meantime.  Follow-Up Instructions: Return for f/u with Dr. Sharol Given.   Orders:  Orders Placed This Encounter  Procedures   XR Foot Complete Left   Meds ordered this encounter  Medications   traMADol (ULTRAM) 50 MG tablet    Sig: Take 1 tablet (50 mg total) by mouth every 12 (twelve) hours as needed.    Dispense:  30 tablet    Refill:  2      Procedures: No procedures performed   Clinical Data: No additional findings.   Subjective: Chief Complaint  Patient presents with   Left Foot - Pain    HPI patient is a pleasant 61 year old gentleman who comes in today with left foot pain for the past 2 months or so which has progressively worsened.  He denies any injury or change in activity.  The pain is to the lateral foot at the base of the fifth metatarsal.  Symptoms appear to be worse when he is applying pressure such as when he is wearing a tight fitting shoe or at the end of the day after he has been on his feet.  He has been taking Tylenol, Advil and Mobic all without relief.  He denies any fevers or chills.  No tenderness to the ankle.  Review of Systems as detailed in HPI.  All others reviewed and are  negative.   Objective: Vital Signs: There were no vitals taken for this visit.  Physical Exam well-developed well-nourished gentleman in no acute distress.  Alert and oriented x3.  Ortho Exam left foot exam reveals a moderately tender prominence to the base of the fifth metatarsal.  No skin changes or signs of infection or cellulitis.  He does have a callus to this area.  No tenderness along the peroneal tendon.  Painless range of motion of the foot.  He is neurovascular intact distally.  Specialty Comments:  No specialty comments available.  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   Hypomagnesemia 03/02/2016   Major depressive disorder 02/29/2016   Hyperlipidemia    Generalized anxiety disorder    Arthritis of carpometacarpal (CMC) joint of left thumb    Past Medical History:  Diagnosis Date   Acute respiratory failure with hypoxia  03/02/2016   secondary to seizure activity   Allergy    Arteriovenous malformation of precerebral vessels    posterior right frontal lobe angioma (asymptomatic)   Arthritis of carpometacarpal [facility]) joint of left thumb    BPH with obstruction/lower urinary tract symptoms 02/24/2019   Cancer ([place])    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   seizure free since 2017   Diverticulosis of colon    Generalized anxiety disorder    History of adenomatous polyp of colon 09/03/2012   tubual adenoma and hyperplastic   History of basal cell carcinoma excision 08/21/2014   scalp; also left lateral neck (02/15/2015)   History of COVID-19 06/2020   Hyperlipidemia    Hypomagnesemia 03/02/2016    Leukocytosis    Major depressive disorder 02/29/2016   Mild neurocognitive disorder, unclear etiology 09/27/2020   OA (osteoarthritis)    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   Subcutaneous cyst    lower abdomen and right forearm    Family History  Problem Relation Age of Onset   Alzheimer's disease Mother        early-onset presentation; late 40s/early 52s   Diabetes Father    Heart disease Father    Hypertension Father    Alzheimer's disease Maternal Aunt        symptom onset during late 70s/early 59s   Alzheimer's disease Maternal Grandmother        symptom onset during late 70s/early 80s   Colon cancer Neg Hx    Stomach cancer Neg Hx    Colon polyps Neg Hx    Esophageal cancer Neg Hx    Rectal cancer Neg Hx     Past Surgical History:  Procedure Laterality Date   ANTERIOR CRUCIATE LIGAMENT REPAIR Left 1989; 2007   COLONOSCOPY     COLONOSCOPY WITH PROPOFOL  09/03/2012   KNEE ARTHROSCOPY WITH MEDIAL MENISECTOMY Right 09/19/2015   Procedure: RIGHT KNEE ARTHROSCOPY WITH PARTIAL MEDIAL MENISCECTOMY;  Surgeon: Leandrew Koyanagi, MD;  Location: [facility];  Service: Orthopedics;  Laterality: Right;   LIPOMA EXCISION Right 05/01/2017   Procedure: EXCISION OF CYST FROM RIGHT FOREARM AND LOWER ABDOMEN;  Surgeon: Clovis Riley, MD;  Location: [facility];  Service: General;  Laterality: Right;  site= right arm and abdomen   POLYPECTOMY     REMOVAL GREAT TOE NAIL Right 1993   SHOULDER SURGERY Right 1999   TRANSTHORACIC ECHOCARDIOGRAM  03/01/2016   GRADE 1 DIASTOLIC DYSFUNCTION/  EF 55-60%   WRIST GANGLION EXCISION Right 1998   Social History   Occupational History   Not on file  Tobacco Use   Smoking status: Never   Smokeless tobacco: Never  Vaping Use   Vaping Use: Never used  Substance and Sexual Activity   Alcohol use: Yes    Comment: rarely   Drug use: No   Sexual activity: Not on file

## 2022-10-23 ENCOUNTER — Encounter: Payer: Self-pay | Admitting: Orthopedic Surgery

## 2022-10-23 ENCOUNTER — Ambulatory Visit: Payer: Managed Care, Other (non HMO) | Admitting: Orthopedic Surgery

## 2022-10-23 DIAGNOSIS — M216X2 Other acquired deformities of left foot: Secondary | ICD-10-CM

## 2022-10-23 DIAGNOSIS — M6702 Short Achilles tendon (acquired), left ankle: Secondary | ICD-10-CM

## 2022-10-23 NOTE — Progress Notes (Signed)
Office Visit Note   Patient: Nathan Ballard           Date of Birth: 12/30/1960           MRN: [id number] Visit Date: 10/23/2022              Requested by: [name], DO [address] [place],  [place] [id number] PCP: [name], DO  Chief Complaint  Patient presents with   Left Foot - Wound Check      HPI: Patient is a 61 year old gentleman who is seen for initial evaluation referral from Dr. Erlinda Hong for base of the fifth metatarsal pain and callus.  Assessment & Plan: Visit Diagnoses:  1. Cavovarus deformity of foot, acquired, left   2. Contracture of left Achilles tendon     Plan: Discussed patient's symptoms seem to be coming from a cavovarus foot with Achilles contracture.  Patient was given instructions for Achilles stretching.  His orthotic was modified it was cut out beneath the first metatarsal head to allow for plantarflexion of the first ray and posted laterally to get his hindfoot in valgus.  Follow-Up Instructions: Return in about 4 weeks (around 11/20/2022).   Ortho Exam  Patient is alert, oriented, no adenopathy, well-dressed, normal affect, normal respiratory effort. Examination patient is a good dorsalis pedis pulse.  He has good ankle and subtalar motion.  Patient is a plantarflexed first ray with cavovarus foot.  Patient has callus beneath the base of the fifth metatarsal with overloading the lateral foot with varus hindfoot.  Patient has Achilles tightness with dorsiflexion to neutral with his knee extended.  Radiographs show a long second metatarsal with flattening of the second metatarsal head.  There is some mild arthritis of the talonavicular joint.  Imaging: No results found. No images are attached to the encounter.  Labs: Lab Results  Component Value Date   ESRSEDRATE 2 03/01/2016   CRP 0.5 03/01/2016     Lab Results  Component Value Date   ALBUMIN 3.9 11/06/2021   ALBUMIN 3.7 03/31/2016   ALBUMIN 4.6 03/28/2016    Lab  Results  Component Value Date   MG 1.9 11/06/2021   MG 1.7 03/22/2016   MG 1.9 03/03/2016   Lab Results  Component Value Date   VD25OH 36.21 06/24/2016    No results found for: "PREALBUMIN"    Latest Ref Rng & Units 11/06/2021    4:21 PM 05/01/2017    7:14 AM 03/31/2016    2:19 AM  CBC EXTENDED  WBC 4.0 - 10.5 K/uL 5.0   5.1   RBC 4.22 - 5.81 MIL/uL 4.42   4.29   Hemoglobin 13.0 - 17.0 g/dL 14.8  13.3  13.3   HCT 39.0 - 52.0 % 42.3  39.0  40.4   Platelets 150 - 400 K/uL 177   192   NEUT# 1.7 - 7.7 K/uL 2.8     Lymph# 0.7 - 4.0 K/uL 1.6        There is no height or weight on file to calculate BMI.  Orders:  No orders of the defined types were placed in this encounter.  No orders of the defined types were placed in this encounter.    Procedures: No procedures performed  Clinical Data: No additional findings.  ROS:  All other systems negative, except as noted in the HPI. Review of Systems  Objective: Vital Signs: There were no vitals taken for this visit.  Specialty Comments:  No specialty comments available.  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   Hypomagnesemia 03/02/2016   Major depressive disorder 02/29/2016   Hyperlipidemia    Generalized anxiety disorder    Arthritis of carpometacarpal (CMC) joint of left thumb    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 (asymptomatic)   Arthritis of carpometacarpal (CMC) joint of left thumb    BPH with obstruction/lower urinary tract symptoms 02/24/2019    Cancer ([facility])    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   seizure free since 2017   Diverticulosis of colon    Generalized anxiety disorder    History of adenomatous polyp of colon 09/03/2012   tubual adenoma and hyperplastic   History of basal cell carcinoma excision 08/21/2014   scalp; also left lateral neck (02/15/2015)   History of COVID-19 06/2020   Hyperlipidemia    Hypomagnesemia 03/02/2016   Leukocytosis    Major depressive disorder 02/29/2016   Mild neurocognitive disorder, unclear etiology 09/27/2020   OA (osteoarthritis)    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   Subcutaneous cyst    lower abdomen and right forearm    Family History  Problem Relation Age of Onset   Alzheimer's disease Mother        early-onset presentation; late 40s/early 17s   Diabetes Father    Heart disease Father    Hypertension Father    Alzheimer's disease Maternal Aunt        symptom onset during late 70s/early 95s   Alzheimer's disease Maternal Grandmother        symptom onset during late 70s/early 80s   Colon cancer Neg Hx    Stomach cancer Neg Hx    Colon polyps Neg Hx    Esophageal cancer Neg Hx    Rectal cancer Neg Hx     Past Surgical History:  Procedure Laterality Date   ANTERIOR CRUCIATE LIGAMENT REPAIR Left 1989; 2007   COLONOSCOPY     COLONOSCOPY WITH PROPOFOL  09/03/2012   KNEE ARTHROSCOPY WITH MEDIAL MENISECTOMY Right 09/19/2015   Procedure: RIGHT KNEE ARTHROSCOPY WITH PARTIAL MEDIAL MENISCECTOMY;  Surgeon: Leandrew Koyanagi, MD;  Location: [facility];  Service: Orthopedics;  Laterality: Right;   LIPOMA EXCISION Right 05/01/2017   Procedure: EXCISION OF CYST FROM RIGHT FOREARM AND LOWER ABDOMEN;  Surgeon: Clovis Riley, MD;  Location: [facility];  Service: General;  Laterality: Right;  site= right arm and abdomen   POLYPECTOMY     REMOVAL GREAT TOE NAIL Right 1993   SHOULDER SURGERY Right 1999   TRANSTHORACIC ECHOCARDIOGRAM  03/01/2016   GRADE 1 DIASTOLIC  DYSFUNCTION/  EF 55-60%   WRIST GANGLION EXCISION Right 1998   Social History   Occupational History   Not on file  Tobacco Use   Smoking status: Never   Smokeless tobacco: Never  Vaping Use   Vaping Use: Never used  Substance and Sexual Activity   Alcohol use: Yes    Comment: rarely   Drug use: No   Sexual activity: Not on file

## 2022-10-28 ENCOUNTER — Encounter: Payer: Self-pay | Admitting: Internal Medicine

## 2022-10-30 ENCOUNTER — Ambulatory Visit (AMBULATORY_SURGERY_CENTER): Payer: Managed Care, Other (non HMO) | Admitting: Internal Medicine

## 2022-10-30 ENCOUNTER — Encounter: Payer: Self-pay | Admitting: Internal Medicine

## 2022-10-30 VITALS — BP 110/77 | HR 60 | Temp 98.0°F | Resp 10 | Ht 72.0 in | Wt 211.0 lb

## 2022-10-30 DIAGNOSIS — Z09 Encounter for follow-up examination after completed treatment for conditions other than malignant neoplasm: Secondary | ICD-10-CM

## 2022-10-30 DIAGNOSIS — Z86010 Personal history of colonic polyps: Secondary | ICD-10-CM | POA: Diagnosis not present

## 2022-10-30 MED ORDER — SODIUM CHLORIDE 0.9 % IV SOLN: 500.0000 mL | Freq: Once | INTRAVENOUS | Status: DC

## 2022-10-30 NOTE — Op Note (Signed)
[facility] Patient Name: Nathan Ballard Procedure Date: 10/30/2022 9:09 AM MRN: [id number] Endoscopist: Jerene Bears , MD, [id number] Age: 61 Referring MD:  Date of Birth: 1961-01-09 Gender: Male Account #: [id number] Procedure:                Colonoscopy Indications:              High risk colon cancer surveillance: Personal                            history of non-advanced adenoma, Last colonoscopy:                            December 2018 (TA x 1); 2013 (TA x 1) Medicines:                Monitored Anesthesia Care Procedure:                Pre-Anesthesia Assessment:                           - Prior to the procedure, a History and Physical                            was performed, and patient medications and                            allergies were reviewed. The patient's tolerance of                            previous anesthesia was also reviewed. The risks                            and benefits of the procedure and the sedation                            options and risks were discussed with the patient.                            All questions were answered, and informed consent                            was obtained. Prior Anticoagulants: The patient has                            taken no anticoagulant or antiplatelet agents. ASA                            Grade Assessment: II - A patient with mild systemic                            disease. After reviewing the risks and benefits,                            the patient was deemed in satisfactory condition to  undergo the procedure.                           After obtaining informed consent, the colonoscope                            was passed under direct vision. Throughout the                            procedure, the patient's blood pressure, pulse, and                            oxygen saturations were monitored continuously. The                            Olympus CF-HQ190L  (Serial# [id number]) Colonoscope was                            introduced through the anus and advanced to the                            cecum, identified by appendiceal orifice and                            ileocecal valve. The colonoscopy was performed                            without difficulty. The patient tolerated the                            procedure well. The quality of the bowel                            preparation was good. The ileocecal valve,                            appendiceal orifice, and rectum were photographed. Scope In: 9:18:15 AM Scope Out: 9:36:34 AM Scope Withdrawal Time: 0 hours 12 minutes 23 seconds  Total Procedure Duration: 0 hours 18 minutes 19 seconds  Findings:                 The digital rectal exam was normal.                           Multiple large-mouthed, medium-mouthed and                            small-mouthed diverticula were found from cecum to                            sigmoid colon.                           Internal hemorrhoids were found during  retroflexion. The hemorrhoids were small.                           The exam was otherwise without abnormality. Complications:            No immediate complications. Estimated Blood Loss:     Estimated blood loss: none. Impression:               - Moderate diverticulosis from cecum to sigmoid                            colon.                           - Small internal hemorrhoids.                           - The examination was otherwise normal.                           - No specimens collected. Recommendation:           - Patient has a contact number available for                            emergencies. The signs and symptoms of potential                            delayed complications were discussed with the                            patient. Return to normal activities tomorrow.                            Written discharge instructions were provided to the                             patient.                           - Resume previous diet.                           - Continue present medications.                           - Repeat colonoscopy in 10 years for surveillance. Jerene Bears, MD 10/30/2022 9:38:50 AM This report has been signed electronically.

## 2022-10-30 NOTE — Progress Notes (Signed)
A/ox3, pleased with MAC, report to RN 

## 2022-10-30 NOTE — Patient Instructions (Addendum)
RECOMMENDATIONS: - Patient has a contact number available for emergencies. The signs and symptoms of potential delayed complications were discussed with the patient. Return to normal activities tomorrow. Written discharge instructions were provided to the patient. - Resume previous diet. - Continue present medications. - Repeat colonoscopy in 10 years for surveillance.   YOU HAD AN ENDOSCOPIC PROCEDURE TODAY AT [facility]:   Refer to the procedure report that was given to you for any specific questions about what was found during the examination.  If the procedure report does not answer your questions, please call your gastroenterologist to clarify.  If you requested that your care partner not be given the details of your procedure findings, then the procedure report has been included in a sealed envelope for you to review at your convenience later.  YOU SHOULD EXPECT: Some feelings of bloating in the abdomen. Passage of more gas than usual.  Walking can help get rid of the air that was put into your GI tract during the procedure and reduce the bloating. If you had a lower endoscopy (such as a colonoscopy or flexible sigmoidoscopy) you may notice spotting of blood in your stool or on the toilet paper. If you underwent a bowel prep for your procedure, you may not have a normal bowel movement for a few days.  Please Note:  You might notice some irritation and congestion in your nose or some drainage.  This is from the oxygen used during your procedure.  There is no need for concern and it should clear up in a day or so.  SYMPTOMS TO REPORT IMMEDIATELY:  Following lower endoscopy (colonoscopy or flexible sigmoidoscopy):  Excessive amounts of blood in the stool  Significant tenderness or worsening of abdominal pains  Swelling of the abdomen that is new, acute  Fever of 100F or higher   For urgent or emergent issues, a gastroenterologist can be reached at any hour by calling  [phone]. Do not use MyChart messaging for urgent concerns.    DIET:  We do recommend a small meal at first, but then you may proceed to your regular diet.  Drink plenty of fluids but you should avoid alcoholic beverages for 24 hours.  MEDICATIONS: Continue present medications.  Please see handouts given to you by your recovery nurse: diverticulosis, hemorrhoids.  Thank you for allowing [place] to provide for your healthcare needs today.  ACTIVITY:  You should plan to take it easy for the rest of today and you should NOT DRIVE or use heavy machinery until tomorrow (because of the sedation medicines used during the test).    FOLLOW UP: Our staff will call the number listed on your records the next business day following your procedure.  We will call around 7:15- 8:00 am to check on you and address any questions or concerns that you may have regarding the information given to you following your procedure. If we do not reach you, we will leave a message.     If any biopsies were taken you will be contacted by phone or by letter within the next 1-3 weeks.  Please call us at [phone] if you have not heard about the biopsies in 3 weeks.    SIGNATURES/CONFIDENTIALITY: You and/or your care partner have signed paperwork which will be entered into your electronic medical record.  These signatures attest to the fact that that the information above on your After Visit Summary has been reviewed and is understood.  Full responsibility of the  confidentiality of this discharge information lies with you and/or your care-partner. 

## 2022-10-30 NOTE — Progress Notes (Signed)
GASTROENTEROLOGY PROCEDURE H&P NOTE   Primary Care Physician: [name], DO    Reason for Procedure:  History of adenomatous colon polyps  Plan:    Colonoscopy  Patient is appropriate for endoscopic procedure(s) in the ambulatory ([place]) setting.  The nature of the procedure, as well as the risks, benefits, and alternatives were carefully and thoroughly reviewed with the patient. Ample time for discussion and questions allowed. The patient understood, was satisfied, and agreed to proceed.     HPI: Nathan Ballard is a 61 y.o. male who presents for colonoscopy.  Medical history as below.  Tolerated the prep.  No recent chest pain or shortness of breath.  No abdominal pain today.  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   Cancer ([place])    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   seizure free since 2017   Diverticulosis of colon    Generalized anxiety disorder    History of adenomatous polyp of colon 09/03/2012   tubual adenoma and hyperplastic   History of basal cell carcinoma excision 08/21/2014   scalp; also left lateral neck (02/15/2015)   History of COVID-19 06/2020   Hyperlipidemia    Hypomagnesemia 03/02/2016   Leukocytosis    Major depressive disorder 02/29/2016   Mild neurocognitive disorder, unclear etiology 09/27/2020   OA (osteoarthritis)    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   Subcutaneous cyst    lower abdomen and right forearm    Past Surgical History:  Procedure Laterality Date   ANTERIOR CRUCIATE LIGAMENT REPAIR Left 1989; 2007   COLONOSCOPY     COLONOSCOPY WITH PROPOFOL  09/03/2012   KNEE ARTHROSCOPY WITH MEDIAL MENISECTOMY Right  09/19/2015   Procedure: RIGHT KNEE ARTHROSCOPY WITH PARTIAL MEDIAL MENISCECTOMY;  Surgeon: Leandrew Koyanagi, MD;  Location: [facility];  Service: Orthopedics;  Laterality: Right;   LIPOMA EXCISION Right 05/01/2017   Procedure: EXCISION OF CYST FROM RIGHT FOREARM AND LOWER ABDOMEN;  Surgeon: Clovis Riley, MD;  Location: [facility];  Service: General;  Laterality: Right;  site= right arm and abdomen   POLYPECTOMY     REMOVAL GREAT TOE NAIL Right [place] ECHOCARDIOGRAM  03/01/2016   GRADE 1 DIASTOLIC DYSFUNCTION/  EF 55-60%   WRIST GANGLION EXCISION Right 1998    Prior to Admission medications   Medication Sig Start Date End Date Taking? Authorizing Provider  alfuzosin (UROXATRAL) 10 MG 24 hr tablet Take 10 mg by mouth daily. 07/16/21  Yes [provider]  atorvastatin (LIPITOR) 40 MG tablet Take 20 mg by mouth daily. 08/18/12  Yes [provider]  Biotin 10000 MCG TABS Take 10,000 mcg by mouth daily.   Yes [provider]  Cholecalciferol (VITAMIN D) 50 MCG (2000 UT) tablet Take 2,000 Units by mouth daily.   Yes [provider]  donepezil (ARICEPT) 10 MG tablet TAKE 1 TABLET BY MOUTH AT BEDTIME Patient taking differently: Take 10 mg by mouth at bedtime. 05/31/21  Yes Jaffe, Adam R, DO  finasteride (PROSCAR) 5 MG tablet Take 5 mg by mouth daily.   Yes [provider]  gabapentin (NEURONTIN) 300 MG capsule Take 300 mg  by mouth at bedtime.   Yes [provider]  meloxicam (MOBIC) 7.5 MG tablet Take 7.5 mg by mouth as needed for pain.   Yes [provider]  Multiple Vitamin (MULTIVITAMIN) tablet Take 3 tablets by mouth daily. Living Green   Yes [provider]  Omega-3 1000 MG CAPS Take 1,000 mg by mouth daily.   Yes [provider]  OVER THE COUNTER MEDICATION Take 1 capsule by mouth daily. Neuriva   Yes [provider]  oxybutynin  (DITROPAN XL) 15 MG 24 hr tablet Take 15 mg by mouth daily. 11/05/21  Yes [provider]  SAW PALMETTO, SERENOA REPENS, PO Take 2 capsules by mouth daily.   Yes [provider]  tamsulosin (FLOMAX) 0.4 MG CAPS capsule Take 0.8 mg by mouth at bedtime. 11/05/21  Yes [provider]  TURMERIC CURCUMIN PO Take 3 capsules by mouth daily.   Yes [provider]  acetaminophen (TYLENOL) 500 MG tablet Take 500 mg by mouth every 6 (six) hours as needed for headache or mild pain.    [provider]  cyclobenzaprine (FLEXERIL) 5 MG tablet Take 1 tablet (5 mg total) by mouth 2 (two) times daily as needed for muscle spasms. 10/01/22   Teodora Medici, FNP  traMADol (ULTRAM) 50 MG tablet Take 1 tablet (50 mg total) by mouth every 12 (twelve) hours as needed. 10/10/22   Aundra Dubin, PA-C    Current Outpatient Medications  Medication Sig Dispense Refill   alfuzosin (UROXATRAL) 10 MG 24 hr tablet Take 10 mg by mouth daily.     atorvastatin (LIPITOR) 40 MG tablet Take 20 mg by mouth daily.     Biotin 10000 MCG TABS Take 10,000 mcg by mouth daily.     Cholecalciferol (VITAMIN D) 50 MCG (2000 UT) tablet Take 2,000 Units by mouth daily.     donepezil (ARICEPT) 10 MG tablet TAKE 1 TABLET BY MOUTH AT BEDTIME (Patient taking differently: Take 10 mg by mouth at bedtime.) 30 tablet 5   finasteride (PROSCAR) 5 MG tablet Take 5 mg by mouth daily.     gabapentin (NEURONTIN) 300 MG capsule Take 300 mg by mouth at bedtime.     meloxicam (MOBIC) 7.5 MG tablet Take 7.5 mg by mouth as needed for pain.     Multiple Vitamin (MULTIVITAMIN) tablet Take 3 tablets by mouth daily. Living Green     Omega-3 1000 MG CAPS Take 1,000 mg by mouth daily.     OVER THE COUNTER MEDICATION Take 1 capsule by mouth daily. Neuriva     oxybutynin (DITROPAN XL) 15 MG 24 hr tablet Take 15 mg by mouth daily.     SAW PALMETTO, SERENOA REPENS, PO Take 2 capsules by mouth daily.     tamsulosin (FLOMAX) 0.4  MG CAPS capsule Take 0.8 mg by mouth at bedtime.     TURMERIC CURCUMIN PO Take 3 capsules by mouth daily.     acetaminophen (TYLENOL) 500 MG tablet Take 500 mg by mouth every 6 (six) hours as needed for headache or mild pain.     cyclobenzaprine (FLEXERIL) 5 MG tablet Take 1 tablet (5 mg total) by mouth 2 (two) times daily as needed for muscle spasms. 20 tablet 0   traMADol (ULTRAM) 50 MG tablet Take 1 tablet (50 mg total) by mouth every 12 (twelve) hours as needed. 30 tablet 2   Current Facility-Administered Medications  Medication Dose Route Frequency Provider Last Rate Last Admin   0.9 %  sodium chloride infusion  500 mL Intravenous Once Kentavius Dettore, Lajuan Lines, MD        Allergies as of 10/30/2022 - Review Complete 10/30/2022  Allergen Reaction Noted   Morphine and related Nausea And Vomiting 09/19/2015   Duloxetine hcl Other (See Comments) 11/07/2021    Family History  Problem Relation Age of Onset   Alzheimer's disease Mother        early-onset presentation; late 40s/early 32s   Diabetes Father    Heart disease Father    Hypertension Father    Alzheimer's disease Maternal Aunt        symptom onset during late 70s/early 51s   Alzheimer's disease Maternal Grandmother        symptom onset during late 70s/early 80s   Colon cancer Neg Hx    Stomach cancer Neg Hx    Colon polyps Neg Hx    Esophageal cancer Neg Hx    Rectal cancer Neg Hx     Social History   Socioeconomic History   Marital status: Married    Spouse name: Not on file   Number of children: 2   Years of education: 14   Highest education level: Some college, no degree  Occupational History   Not on file  Tobacco Use   Smoking status: Never   Smokeless tobacco: Never  Vaping Use   Vaping Use: Never used  Substance and Sexual Activity   Alcohol use: Yes    Comment: rarely   Drug use: No   Sexual activity: Not on file  Other Topics Concern   Not on file  Social History Narrative   Right handed   Lives in one  story home with wife   Social Determinants of Health   Financial Resource Strain: Not on file  Food Insecurity: Not on file  Transportation Needs: Not on file  Physical Activity: Not on file  Stress: Not on file  Social Connections: Not on file  Intimate Partner Violence: Not on file    Physical Exam: Vital signs in last 24 hours: '@BP'$  (!) 143/85   Pulse 76   Temp 98 F (36.7 C)   Ht 6' (1.829 m)   Wt 211 lb (95.7 kg)   SpO2 98%   BMI 28.62 kg/m  GEN: NAD EYE: Sclerae anicteric ENT: MMM CV: Non-tachycardic Pulm: CTA b/l GI: Soft, NT/ND NEURO:  Alert & Oriented x 3   Zenovia Jarred, MD [facility]  10/30/2022 9:10 AM

## 2022-10-30 NOTE — Progress Notes (Signed)
Pt's states no medical or surgical changes since previsit or office visit. 

## 2022-10-31 ENCOUNTER — Telehealth: Payer: Self-pay

## 2022-10-31 NOTE — Telephone Encounter (Signed)
  Follow up Call-     10/30/2022    8:22 AM  Call back number  Post procedure Call Back phone  # [phone]  Permission to leave phone message Yes     Patient questions:  Do you have a fever, pain , or abdominal swelling? No. Pain Score  0 *  Have you tolerated food without any problems? Yes.    Have you been able to return to your normal activities? Yes.    Do you have any questions about your discharge instructions: Diet   No. Medications  No. Follow up visit  No.  Do you have questions or concerns about your Care? No.  Actions: * If pain score is 4 or above: No action needed, pain <4.

## 2022-10-31 NOTE — Addendum Note (Signed)
Addended by: Etheleen Nicks on: 10/31/2022 09:13 AM   Modules accepted: Orders

## 2022-11-20 ENCOUNTER — Ambulatory Visit: Payer: Managed Care, Other (non HMO) | Admitting: Orthopedic Surgery

## 2023-04-20 ENCOUNTER — Encounter (HOSPITAL_COMMUNITY): Payer: Self-pay

## 2023-04-20 ENCOUNTER — Emergency Department (HOSPITAL_COMMUNITY): Payer: Managed Care, Other (non HMO)

## 2023-04-20 ENCOUNTER — Emergency Department (HOSPITAL_COMMUNITY)
Admission: EM | Admit: 2023-04-20 | Discharge: 2023-04-21 | Disposition: A | Discharge status: Home / Self Care | Payer: Managed Care, Other (non HMO) | Attending: Emergency Medicine | Admitting: Emergency Medicine

## 2023-04-20 ENCOUNTER — Other Ambulatory Visit: Payer: Self-pay

## 2023-04-20 DIAGNOSIS — I10 Essential (primary) hypertension: Secondary | ICD-10-CM | POA: Insufficient documentation

## 2023-04-20 DIAGNOSIS — Z79899 Other long term (current) drug therapy: Secondary | ICD-10-CM | POA: Diagnosis not present

## 2023-04-20 DIAGNOSIS — R000 Tachycardia, unspecified: Secondary | ICD-10-CM | POA: Diagnosis present

## 2023-04-20 DIAGNOSIS — I480 Paroxysmal atrial fibrillation: Secondary | ICD-10-CM | POA: Diagnosis not present

## 2023-04-20 LAB — BASIC METABOLIC PANEL
Anion gap: 6 (ref 5–15)
BUN: 17 mg/dL (ref 8–23)
CO2: 25 mmol/L (ref 22–32)
Calcium: 8.8 mg/dL — ABNORMAL LOW (ref 8.9–10.3)
Chloride: 108 mmol/L (ref 98–111)
Creatinine, Ser: 0.87 mg/dL (ref 0.61–1.24)
GFR, Estimated: >60 mL/min (ref >60)
Glucose, Bld: 114 mg/dL — ABNORMAL HIGH (ref 70–99)
Potassium: 3.8 mmol/L (ref 3.5–5.1)
Sodium: 139 mmol/L (ref 135–145)

## 2023-04-20 LAB — CBC
HCT: 41 % (ref 39.0–52.0)
Hemoglobin: 14.1 g/dL (ref 13.0–17.0)
MCH: 32.5 pg (ref 26.0–34.0)
MCHC: 34.4 g/dL (ref 30.0–36.0)
MCV: 94.5 fL (ref 80.0–100.0)
Platelets: 195 10*3/uL (ref 150–400)
RBC: 4.34 MIL/uL (ref 4.22–5.81)
RDW: 11.9 % (ref 11.5–15.5)
WBC: 4.6 10*3/uL (ref 4.0–10.5)
nRBC: 0 % (ref 0.0–0.2)

## 2023-04-20 LAB — TROPONIN I (HIGH SENSITIVITY): Troponin I (High Sensitivity): 11 ng/L (ref <18)

## 2023-04-20 NOTE — ED Triage Notes (Signed)
Patient BIB POV from playing pickle ball with complaint of a-fib.   Patient reports after playing game, heart rate of 208, went home and called EMS & heart rate was 125.

## 2023-04-21 LAB — TROPONIN I (HIGH SENSITIVITY): Troponin I (High Sensitivity): 14 ng/L (ref <18)

## 2023-04-21 NOTE — ED Provider Notes (Signed)
[place] EMERGENCY DEPARTMENT AT [facility] Provider Note   CSN: [id number] Arrival date & time: 04/20/23  2149     History  Chief Complaint  Patient presents with   Tachycardia    Nathan Ballard is a 62 y.o. male.  The history is provided by the patient and medical records.  Nathan Ballard is a 62 y.o. male who presents to the Emergency Department complaining of palpitations.  He presents the emergency department for evaluation of palpitations that started when he was playing pickle ball around 630.  He checked his heart rate at home and it was elevated to 200.  He took a diltiazem and flecainide, which have been prescribed for his atrial fibrillation in the past.  He did briefly feel lightheaded but this is now resolved.  When he was awaiting evaluation in the emergency department around 1130 his heart returned to normal sinus rhythm.  No reported chest pain, difficulty breathing, nausea, vomiting.  No recent illnesses.  No recent injuries.  He follows with Dr. Gwynneth Macleod.  No history of DVT/PE.  He was formally on Eliquis but has not been on that for almost a year.       Home Medications Prior to Admission medications   Medication Sig Start Date End Date Taking? Authorizing Provider  acetaminophen (TYLENOL) 500 MG tablet Take 500 mg by mouth every 6 (six) hours as needed for headache or mild pain.    [provider]  alfuzosin (UROXATRAL) 10 MG 24 hr tablet Take 10 mg by mouth daily. 07/16/21   [provider]  atorvastatin (LIPITOR) 40 MG tablet Take 20 mg by mouth daily. 08/18/12   [provider]  Biotin 40981 MCG TABS Take 10,000 mcg by mouth daily.    [provider]  Cholecalciferol (VITAMIN D) 50 MCG (2000 UT) tablet Take 2,000 Units by mouth daily.    [provider]  cyclobenzaprine (FLEXERIL) 5 MG tablet Take 1 tablet (5 mg total) by mouth 2 (two) times daily as needed for muscle spasms. 10/01/22   Gustavus Bryant,  FNP  donepezil (ARICEPT) 10 MG tablet TAKE 1 TABLET BY MOUTH AT BEDTIME Patient taking differently: Take 10 mg by mouth at bedtime. 05/31/21   Drema Dallas, DO  finasteride (PROSCAR) 5 MG tablet Take 5 mg by mouth daily.    [provider]  gabapentin (NEURONTIN) 300 MG capsule Take 300 mg by mouth at bedtime.    [provider]  meloxicam (MOBIC) 7.5 MG tablet Take 7.5 mg by mouth as needed for pain.    [provider]  Multiple Vitamin (MULTIVITAMIN) tablet Take 3 tablets by mouth daily. Living Green    [provider]  Omega-3 1000 MG CAPS Take 1,000 mg by mouth daily.    [provider]  OVER THE COUNTER MEDICATION Take 1 capsule by mouth daily. Neuriva    [provider]  oxybutynin (DITROPAN XL) 15 MG 24 hr tablet Take 15 mg by mouth daily. 11/05/21   [provider]  SAW PALMETTO, SERENOA REPENS, PO Take 2 capsules by mouth daily.    [provider]  tamsulosin (FLOMAX) 0.4 MG CAPS capsule Take 0.8 mg by mouth at bedtime. 11/05/21   [provider]  traMADol (ULTRAM) 50 MG tablet Take 1 tablet (50 mg total) by mouth every 12 (twelve) hours as needed. 10/10/22   Cristie Hem, PA-C  TURMERIC CURCUMIN PO Take 3 capsules by mouth daily.  [provider]      Allergies    Morphine and codeine and Duloxetine hcl    Review of Systems   Review of Systems  All other systems reviewed and are negative.   Physical Exam Updated Vital Signs BP 125/81   Pulse (!) 54   Temp 97.7 F (36.5 C)   Resp 15   Ht 6' (1.829 m)   Wt 77.1 kg   SpO2 99%   BMI 23.06 kg/m  Physical Exam Vitals and nursing note reviewed.  Constitutional:      Appearance: He is well-developed.  HENT:     Head: Normocephalic and atraumatic.  Cardiovascular:     Rate and Rhythm: Normal rate and regular rhythm.     Heart sounds: No murmur heard. Pulmonary:     Effort: Pulmonary effort is normal. No respiratory distress.      Breath sounds: Normal breath sounds.  Abdominal:     Palpations: Abdomen is soft.     Tenderness: There is no abdominal tenderness. There is no guarding or rebound.  Musculoskeletal:        General: No swelling or tenderness.  Skin:    General: Skin is warm and dry.  Neurological:     Mental Status: He is alert and oriented to person, place, and time.  Psychiatric:        Behavior: Behavior normal.     ED Results / Procedures / Treatments   Labs (all labs ordered are listed, but only abnormal results are displayed) Labs Reviewed  BASIC METABOLIC PANEL - Abnormal; Notable for the following components:      Result Value   Glucose, Bld 114 (*)    Calcium 8.8 (*)    All other components within normal limits  CBC  TROPONIN I (HIGH SENSITIVITY)  TROPONIN I (HIGH SENSITIVITY)    EKG EKG Interpretation  Date/Time:  Tuesday April 21 2023 02:36:56 EDT Ventricular Rate:  50 PR Interval:  183 QRS Duration: 87 QT Interval:  419 QTC Calculation: 382 R Axis:   57 Text Interpretation: Sinus rhythm Confirmed by Tilden Fossa [phone]) on 04/21/2023 2:44:02 AM  Radiology DG Chest 2 View  Result Date: 04/20/2023 CLINICAL DATA:  Encounter for tachycardia. EXAM: CHEST - 2 VIEW COMPARISON:  Portable chest 10/31/2021 FINDINGS: The heart size and mediastinal contours are within normal limits. Both lungs are clear. The visualized skeletal structures are intact, with degenerative changes and slight kyphodextroscoliosis of the thoracic spine. Osteopenia. Chronic distal right clavicle resection. IMPRESSION: No evidence of acute chest disease.  Unchanged. Electronically Signed   By: Almira Bar M.D.   On: 04/20/2023 22:39    Procedures Procedures    Medications Ordered in ED Medications - No data to display  ED Course/ Medical Decision Making/ A&P Clinical Course as of 04/21/23 0257  Tue Apr 21, 2023  0112 EKG 12-Lead [GD]    Clinical Course User Index [GD] Tillman Abide                             Medical Decision Making Amount and/or Complexity of Data Reviewed Labs: ordered. Radiology: ordered.    CHA2DS2/VAS Stroke Risk Points  Current as of 22 minutes ago     1 >= 2 Points: High Risk  1 - 1.99 Points: Medium Risk  0 Points: Low Risk    Last Change: N/A      Details    This score determines  the patient's risk of having a stroke if the  patient has atrial fibrillation.       Points Metrics  0 Has Congestive Heart Failure:  No    Current as of 22 minutes ago  0 Has Vascular Disease:  No    Current as of 22 minutes ago  1 Has Hypertension:  Yes    Current as of 22 minutes ago  0 Age:  20    Current as of 22 minutes ago  0 Has Diabetes:  No    Current as of 22 minutes ago  0 Had Stroke:  No  Had TIA:  No  Had Thromboembolism:  No    Current as of 22 minutes ago  0 Male:  No    Current as of 22 minutes ago      Patient with history of paroxysmal atrial fibrillation here for evaluation of A-fib that occurred prior to ED arrival.  He did take his home medications and this resolved during his ED stay without additional intervention.  Labs without significant anemia or electrolyte abnormality.  Troponins are negative x 2.  Repeat EKG in the emergency department with sinus bradycardia.  Patient is not symptomatic from the bradycardia.  He is low risk for CVA due to CHA2DS2-VASc of 1.  Will not start Eliquis at this time.  Feel he is stable to discharge home with cardiology follow-up and return precautions.        Final Clinical Impression(s) / ED Diagnoses Final diagnoses:  Paroxysmal atrial fibrillation [facility])    Rx / DC Orders ED Discharge Orders     None         Tilden Fossa, MD 04/21/23 [phone]

## 2023-05-01 ENCOUNTER — Ambulatory Visit: Payer: BC Managed Care – PPO | Admitting: Neurology

## 2023-05-01 NOTE — Progress Notes (Signed)
NEUROLOGY FOLLOW UP OFFICE NOTE  Nathan Ballard [id number]  Assessment/Plan:   Mild neurocognitive disorder, stable; given family history, concern for possible early-onset Alzheimer's    Due to family history and possibility that this may indicate pre-cursor to Alzheimer's, he is taking donepezil 10mg  at bedtime After discussion that he may be a candidate for Leqembi, we will schedule LP to check CSF for biomarkers.  If positive, would refer to [facility]. Otherwise, follow up one year.   Subjective:  Nathan Ballard is a 62 year old right-handed man with paroxysmal a fib, hypertension and hyperlipidemia who follows up for memory deficits  He is accompanied by his wife who provides some history as well.   UPDATE: Taking donepezil 10mg  at bedtime. He is doing well.  No concerns.     HISTORY: His family has noticed memory problems since June 2021, after he lost his job related to the COVID-19 pandemic.  Sometimes when he is asked a question, he has a blank stare.  He has had trouble getting to places when driving on familiar routes but no trouble finding his way home.  No difficulty managing finances.  BCIT score was 2 (had trouble with name and address).  He has since been trying to find another job which has been frustrating.  He reports that his mother was diagnosed with Alzheimer's dementia in her late 80s-early 42s.  He underwent workup for memory loss.  Labs from October 2021 showed B12 698 and TSH 1.64.  MRI of brain with and without contrast on 09/17/2020 showed stable right frontal developmental venous anomaly but otherwise unremarkable.  Neuropsychological evaluation on 09/27/2020 demonstrated mild neurocognitive disorder with impairment across encoding, retrieval, and consolidation aspects of verbal memory.  Etiology unclear.  Despite weakness in verbal memory, he did not display a pattern of performance consistent with early-onset Alzheimer's disease but could not rule out.   In 2021,  he reports a new severe paroxysmal sharp pain in right temple lasting 5 to 8 seconds.  It has only occurred twice.   I saw him back in 2017 for spells with hypoxia and hypercapnia  He has had recurrent episodes in which he is unable to be woken up from a deep sleep with labored breathing.  It would last 10 to 15 minutes.  After he wakes up, he is either lucid or somewhat confused.  On a couple of occasions, he was given Narcan by EMS and he awoke.  However, UDS was negative so it was thought to be coincidental.  He had several ED visits.  On 02/28/16, he was admitted to [facility] following another spell.  He was started on Keppra 500mg  twice daily.  CT of head was unremarkable.  MRI of brain personally reviewed only revealed a right frontal developmental venous anomaly (stable compared to MRI in 2008), but no acute process.  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  He became unresponsive in the hospital and had an O2 sat of 19% and was making gurgling noises.  He was intubated for airway protection.  He was hypotensive but did not lose pulse. Echocardiogram was unremarkable and showed EF 55-60% with grade 1 diastolic dysfunction.  Routine EEG and overnight EEG were normal.  He was discharged on Keppra 1500mg  twice daily.  Acute hypoxic and hypercapnic respiratory failure was felt to be secondary to seizure.  He  returned to the ED on 03/22/16 after another spell.  He was started on Depakote.  Later that evening, he was standing in the kitchen and developed a glassy look in his eyes.  He was slow to respond.  He was sitting and awake but started to shake all over, followed by loss of consciousness.  Otherwise, he does not demonstrate any convulsions.  His wife drove him to nearby fire station for vitals, where his O2 sat was 68% on room air and 86% on O2.  He returned to the  ED. Blood pressure was in the 90s/60s and pulse in the 40s.  He was given a loading dose of IV Depacon.   He continued to have spells in which he seems a little confused.  He does not demonstrate tongue biting or urinary/bowel incontinence.  Blood sugar has always been okay.  He was readmitted to the hospital on 03/29/16.  Long-term EEG was unremarkable.  Keppra was discontinued and he was started on gabapentin 300mg  three times daily.  He remained on Depakote.  72 hour EEG from 04/09/16 to 04/11/16 was normal but did not capture his habitual spells.  He had another spell on 04/14/16.  He was seen by pulmonology and cardiology, both did not suspect primary pulmonary or cardiac etiology.  Since then, he has been doing well.  He had taken himself off Depakote and gabapentin, as well as Cymbalta.  He has not had any other spells.  He has not had any apneic episodes during sleep.  He feels better.  His memory is better.  He was referred to the EMU at [facility] for further evaluation, as well as a sleep study, but he cancelled both since he had been feeling well.  Unclear if spells were central apneic.  He had a similar episode of confusion in 2008.  MRI of brain with and without contrast on 11/10/07 demonstrated the incidental developmental venous anomaly.  Episodes subsequently resolved and hasn't had another spell since 2017.  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   seizure free since 2017   Diverticulosis of colon    Generalized anxiety disorder    History of adenomatous polyp of colon 09/03/2012   tubual adenoma  and hyperplastic   History of basal cell carcinoma excision 08/21/2014   scalp; also left lateral neck (02/15/2015)   History of COVID-19 06/2020   Hyperlipidemia    Hypomagnesemia 03/02/2016   Leukocytosis    Major depressive disorder 02/29/2016   Mild neurocognitive disorder, unclear etiology 09/27/2020   OA (osteoarthritis)    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     alfuzosin (UROXATRAL) 10 MG 24 hr tablet Take 10 mg by mouth daily.     atorvastatin (LIPITOR) 40 MG tablet Take 20 mg by mouth daily.     Biotin 16109 MCG  TABS Take 10,000 mcg by mouth daily.     Cholecalciferol (VITAMIN D) 50 MCG (2000 UT) tablet Take 2,000 Units by mouth daily.     cyclobenzaprine (FLEXERIL) 5 MG tablet Take 1 tablet (5 mg total) by mouth 2 (two) times daily as needed for muscle spasms. 20 tablet 0   donepezil (ARICEPT) 10 MG tablet TAKE 1 TABLET BY MOUTH AT BEDTIME (Patient taking differently: Take 10 mg by mouth at bedtime.) 30 tablet 5   finasteride (PROSCAR) 5 MG tablet Take 5 mg by mouth daily.     gabapentin (NEURONTIN) 300 MG capsule Take 300 mg by mouth at bedtime.     meloxicam (MOBIC) 7.5 MG tablet Take 7.5 mg by mouth as needed for pain.     Multiple Vitamin (MULTIVITAMIN) tablet Take 3 tablets by mouth daily. Living Green     Omega-3 1000 MG CAPS Take 1,000 mg by mouth daily.     OVER THE COUNTER MEDICATION Take 1 capsule by mouth daily. Neuriva     oxybutynin (DITROPAN XL) 15 MG 24 hr tablet Take 15 mg by mouth daily.     SAW PALMETTO, SERENOA REPENS, PO Take 2 capsules by mouth daily.     tamsulosin (FLOMAX) 0.4 MG CAPS capsule Take 0.8 mg by mouth at bedtime.     traMADol (ULTRAM) 50 MG tablet Take 1 tablet (50 mg total)  by mouth every 12 (twelve) hours as needed. 30 tablet 2   TURMERIC CURCUMIN PO Take 3 capsules by mouth daily.     No current facility-administered medications on file prior to visit.    ALLERGIES: Allergies  Allergen Reactions   Morphine And Codeine Nausea And Vomiting   Duloxetine Hcl Other (See Comments)    Other reaction(s): Unknown    FAMILY HISTORY: Family History  Problem Relation Age of Onset   Alzheimer's disease Mother        early-onset presentation; late 40s/early 25s   Diabetes Father    Heart disease Father    Hypertension Father    Alzheimer's disease Maternal Aunt        symptom onset during late 70s/early 80s   Alzheimer's disease Maternal Grandmother        symptom onset during late 70s/early 80s   Colon cancer Neg Hx    Stomach cancer Neg Hx    Colon polyps Neg Hx    Esophageal cancer Neg Hx    Rectal cancer Neg Hx       Objective:  Blood pressure (!) 159/76, pulse 82, height 6' (1.829 m), weight 204 lb 6.4 oz (92.7 kg), SpO2 98 %. General: No acute distress.  Patient appears well-groomed.   Head:  Normocephalic/atraumatic Eyes:  Fundi examined but not visualized Neck: supple, no paraspinal tenderness, full range of motion Heart:  Regular rate and rhythm Neurological Exam:     08/28/2020   11:00 AM 04/25/2021    9:00 AM 04/30/2022    2:00 PM 05/04/2023    3:00 PM  [employer] Mental Exam  Weekday Correct 1 1 1 1   Current year 1 1 1 1   What state are we in? 1 1 1 1   Amount spent 1 0 1 1  Amount left 2 2 2 2   # of Animals 3 2 2 3  5  objects recall 0 1 1 3   Number series 1 1 2 2   Hour markers 2 0 2 2  Time correct 2 2 2  0  Placed X in triangle correctly 1  1 1 1   Largest Figure 1 1 1 1   Name of male 2 2 2 2   Date back to work 0 2 0 0  Type of work 2 2 2 2   State she lived in 2 2 2  0  Total score 22 21 23 22   alert and oriented.  Speech fluent and not dysarthric, language intact.  CN II-XII intact. Bulk and tone normal, muscle  strength 5/5 throughout.  Sensation to light touch intact.  Deep tendon reflexes 2+ throughout.  Finger to nose testing intact.  Gait normal, Romberg negative.     Nathan Millet, DO  CC:  Nathan Reichmann, DO

## 2023-05-04 ENCOUNTER — Ambulatory Visit: Payer: Managed Care, Other (non HMO) | Admitting: Neurology

## 2023-05-04 VITALS — BP 159/76 | HR 82 | Ht 72.0 in | Wt 204.4 lb

## 2023-05-04 DIAGNOSIS — G3184 Mild cognitive impairment, so stated: Secondary | ICD-10-CM | POA: Diagnosis not present

## 2023-05-04 NOTE — Patient Instructions (Addendum)
We will order spinal tap to check spinal fluid for protein seen with Alzheimer's.  If positive, I would like to refer you to [facility] for consideration of new Alzheimer's medication.  Lecanemab Injection What is this medication? LECANEMAB (lek AN e mab) treats Alzheimer disease. It works by decreasing the buildup of amyloid, a protein that may cause Alzheimer disease. This may slow down the worsening of symptoms. It is a monoclonal antibody. This medicine may be used for other purposes; ask your health care provider or pharmacist if you have questions. COMMON BRAND NAME(S): LEQEMBI What should I tell my care team before I take this medication? They need to know if you have any of these conditions: An unusual or allergic reaction to lecanemab, other medications, foods, dyes, or preservatives Take medications that treat or prevent blood clots Pregnant or trying to get pregnant Breast-feeding How should I use this medication? This medication is injected into a vein. It is given by your care team in a hospital or clinic setting. A special MedGuide will be given to you before each treatment. Be sure to read this information carefully each time. Talk to your care team about the use of this medication in children. Special care may be needed. Overdosage: If you think you have taken too much of this medicine contact a poison control center or emergency room at once. NOTE: This medicine is only for you. Do not share this medicine with others. What if I miss a dose? Keep appointments for follow-up doses. It is important not to miss your dose. Call your care team if you are unable to keep an appointment. What may interact with this medication? Interactions have not been studied. This list may not describe all possible interactions. Give your health care provider a list of all the medicines, herbs, non-prescription drugs, or dietary supplements you use. Also tell them if you smoke, drink alcohol, or use  illegal drugs. Some items may interact with your medicine. What should I watch for while using this medication? Visit your care team for regular checks on your progress. Tell your care team if your symptoms do not start to get better or if they get worse. What side effects may I notice from receiving this medication? Side effects that you should report to your care team as soon as possible: Allergic reactions or angioedema--skin rash, itching or hives, swelling of the face, eyes, lips, tongue, arms, or legs, trouble swallowing or breathing Headache, worsening confusion, dizziness, change in vision, nausea, seizures Infusion reactions--chest pain, shortness of breath or trouble breathing, feeling faint or lightheaded Side effects that usually do not require medical attention (report these to your care team if they continue or are bothersome): Cough Diarrhea Headache This list may not describe all possible side effects. Call your doctor for medical advice about side effects. You may report side effects to FDA at 1-800-FDA-1088. Where should I keep my medication? This medication is given in a hospital or clinic. It will not be stored at home. NOTE: This sheet is a summary. It may not cover all possible information. If you have questions about this medicine, talk to your doctor, pharmacist, or health care provider.  2024 Elsevier/Gold Standard (2023-01-04 00:00:00)

## 2023-05-05 ENCOUNTER — Encounter: Payer: Self-pay | Admitting: Neurology

## 2023-07-29 ENCOUNTER — Ambulatory Visit: Payer: 59 | Admitting: Cardiology

## 2023-08-11 ENCOUNTER — Telehealth: Payer: Self-pay

## 2023-08-11 NOTE — Telephone Encounter (Signed)
Telephone call to patient noticed that he haven't schedule his LP yet order added 05/04/23.   Per patient it was a scheduling issue at first but now he is 50/50 unsure if he wants to do the imaging. Will call if he changes his mind.

## 2024-01-18 NOTE — Progress Notes (Unsigned)
 Office Visit Note   Patient: Nathan Ballard           Date of Birth: December 10, 1960           MRN: [id number] Visit Date: 01/19/2024              Requested by: Irena Reichmann, DO [address] [place],  [place] [address] PCP: Irena Reichmann, DO   Assessment & Plan: Visit Diagnoses: No diagnosis found.  Plan: ***  Follow-Up Instructions: No follow-ups on file.   Orders:  No orders of the defined types were placed in this encounter.  No orders of the defined types were placed in this encounter.     Procedures: No procedures performed   Clinical Data: No additional findings.   Subjective: No chief complaint on file.   HPI  Review of Systems   Objective: Vital Signs: There were no vitals taken for this visit.  Physical Exam  Ortho Exam  Specialty Comments:  No specialty comments available.  Imaging: No results found.   PMFS History: Patient Active Problem List   Diagnosis Date Noted  . Mild neurocognitive disorder, unclear etiology 09/27/2020  . 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 Arteriovenous malformation of precerebral vessels   . 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 Hypomagnesemia 03/02/2016  . Major depressive disorder 02/29/2016  . Hyperlipidemia   . Generalized anxiety disorder   . Arthritis of carpometacarpal (CMC) joint of left thumb    Past Medical History:  Diagnosis Date  . Acute respiratory failure with hypoxia 03/02/2016   secondary to seizure activity  . Allergy   . 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 Arthritis of carpometacarpal (CMC) joint of left thumb   . 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 Cancer (HCC)   . 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 Diverticulosis of colon   . Generalized anxiety disorder   . History of  adenomatous polyp of colon 09/03/2012   tubual adenoma and hyperplastic  . History of basal cell carcinoma excision 08/21/2014   scalp; also left lateral neck (02/15/2015)  . History of COVID-19 06/2020  . Hyperlipidemia   . Hypomagnesemia 03/02/2016  . Leukocytosis   . Major depressive disorder 02/29/2016  . Mild neurocognitive disorder, unclear etiology 09/27/2020  . OA (osteoarthritis)   . 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 Subcutaneous cyst    lower abdomen and right forearm    Family History  Problem Relation Age of Onset  . Alzheimer's disease Mother        early-onset presentation; late 40s/early 16s  . Diabetes Father   . Heart disease Father   . Hypertension Father   . Alzheimer's disease Maternal Aunt        symptom onset during late 70s/early 80s  . Alzheimer's disease Maternal Grandmother        symptom onset during late 70s/early 80s  . Colon cancer Neg Hx   . Stomach cancer Neg Hx   . Colon polyps Neg Hx   . Esophageal cancer Neg Hx   . Rectal cancer Neg Hx     Past Surgical History:  Procedure Laterality Date  . ANTERIOR CRUCIATE LIGAMENT REPAIR Left 1989; 2007  . COLONOSCOPY    . COLONOSCOPY WITH PROPOFOL  09/03/2012  . KNEE ARTHROSCOPY WITH MEDIAL MENISECTOMY Right 09/19/2015   Procedure: RIGHT KNEE ARTHROSCOPY  WITH PARTIAL MEDIAL MENISCECTOMY;  Surgeon: Tarry Kos, MD;  Location: [facility];  Service: Orthopedics;  Laterality: Right;  . LIPOMA EXCISION Right 05/01/2017   Procedure: EXCISION OF CYST FROM RIGHT FOREARM AND LOWER ABDOMEN;  Surgeon: Berna Bue, MD;  Location: [facility];  Service: General;  Laterality: Right;  site= right arm and abdomen  . POLYPECTOMY    . REMOVAL GREAT TOE NAIL Right 1993  . SHOULDER SURGERY Right 1999  . TRANSTHORACIC ECHOCARDIOGRAM  03/01/2016   GRADE 1 DIASTOLIC DYSFUNCTION/  EF 55-60%  . WRIST GANGLION EXCISION Right 1998   Social History   Occupational History  . Not on file   Tobacco Use  . Smoking status: Never  . Smokeless tobacco: Never  Vaping Use  . Vaping status: Never Used  Substance and Sexual Activity  . Alcohol use: Yes    Comment: rarely  . Drug use: No  . Sexual activity: Not on file

## 2024-01-19 ENCOUNTER — Other Ambulatory Visit (INDEPENDENT_AMBULATORY_CARE_PROVIDER_SITE_OTHER): Payer: Self-pay

## 2024-01-19 ENCOUNTER — Ambulatory Visit: Payer: Managed Care, Other (non HMO) | Admitting: Orthopaedic Surgery

## 2024-01-19 DIAGNOSIS — M25511 Pain in right shoulder: Secondary | ICD-10-CM

## 2024-01-19 DIAGNOSIS — G8929 Other chronic pain: Secondary | ICD-10-CM

## 2024-01-19 DIAGNOSIS — M25512 Pain in left shoulder: Secondary | ICD-10-CM

## 2024-01-19 MED ORDER — CELECOXIB 200 MG PO CAPS: 200.0000 mg | ORAL_CAPSULE | Freq: Two times a day (BID) | ORAL | 3 refills | Status: DC

## 2024-01-26 ENCOUNTER — Telehealth: Payer: Self-pay | Admitting: Neurology

## 2024-01-26 NOTE — Telephone Encounter (Signed)
 Pt called in and left a message. Nathan Ballard is wanting to find out how to get into the trial Dr. Everlena Cooper mentioned to him at his last visit?

## 2024-01-28 NOTE — Telephone Encounter (Signed)
 Called pt and informed him of Dr. Rosanne Sack answers, He understood and is going to wait until his June appointment to discuss it. Thank you

## 2024-02-01 ENCOUNTER — Ambulatory Visit: Admitting: Sports Medicine

## 2024-02-01 ENCOUNTER — Encounter: Payer: Self-pay | Admitting: Sports Medicine

## 2024-02-01 ENCOUNTER — Other Ambulatory Visit: Payer: Self-pay

## 2024-02-01 DIAGNOSIS — G8929 Other chronic pain: Secondary | ICD-10-CM

## 2024-02-01 DIAGNOSIS — M25512 Pain in left shoulder: Secondary | ICD-10-CM

## 2024-02-01 DIAGNOSIS — M19012 Primary osteoarthritis, left shoulder: Secondary | ICD-10-CM

## 2024-02-01 DIAGNOSIS — M25511 Pain in right shoulder: Secondary | ICD-10-CM | POA: Diagnosis not present

## 2024-02-01 DIAGNOSIS — M19011 Primary osteoarthritis, right shoulder: Secondary | ICD-10-CM

## 2024-02-01 MED ORDER — BUPIVACAINE HCL 0.25 % IJ SOLN
2.0000 mL | INTRAMUSCULAR | Status: AC | PRN
Start: 2024-02-01 — End: 2024-02-01
  Administered 2024-02-01: 2 mL via INTRA_ARTICULAR

## 2024-02-01 MED ORDER — METHYLPREDNISOLONE ACETATE 40 MG/ML IJ SUSP
40.0000 mg | INTRAMUSCULAR | Status: AC | PRN
Start: 2024-02-01 — End: 2024-02-01
  Administered 2024-02-01: 40 mg via INTRA_ARTICULAR

## 2024-02-01 MED ORDER — LIDOCAINE HCL 1 % IJ SOLN
2.0000 mL | INTRAMUSCULAR | Status: AC | PRN
Start: 2024-02-01 — End: 2024-02-01
  Administered 2024-02-01: 2 mL

## 2024-02-01 NOTE — Progress Notes (Signed)
   Procedure Note  Patient: Nathan Ballard             Date of Birth: 1961-02-09           MRN: [id number]             Visit Date: 02/01/2024  Procedures: Visit Diagnoses:  1. Chronic pain of both shoulders   2. Primary osteoarthritis of shoulders, bilateral    Large Joint Inj: R glenohumeral on 02/01/2024 2:44 PM Indications: pain Details: 22 G 3.5 in needle, ultrasound-guided posterior approach Medications: 2 mL lidocaine 1 %; 2 mL bupivacaine 0.25 %; 40 mg methylPREDNISolone acetate 40 MG/ML Outcome: tolerated well, no immediate complications  US-guided glenohumeral joint injection, right shoulder After discussion on risks/benefits/indications, informed verbal consent was obtained. A timeout was then performed. The patient was positioned lying lateral recumbent on examination table. The patient's shoulder was prepped with betadine and multiple alcohol swabs and utilizing ultrasound guidance, the patient's glenohumeral joint was identified on ultrasound. Using ultrasound guidance a 22-gauge, 3.5 inch needle with a mixture of 2:2:1 cc's lidocaine:bupivicaine:depomedrol was directed from a lateral to medial direction via in-plane technique into the glenohumeral joint with visualization of appropriate spread of injectate into the joint. Patient tolerated the procedure well without immediate complications.      Procedure, treatment alternatives, risks and benefits explained, specific risks discussed. Consent was given by the patient. Immediately prior to procedure a time out was called to verify the correct patient, procedure, equipment, support staff and site/side marked as required. Patient was prepped and draped in the usual sterile fashion.    Large Joint Inj: L glenohumeral on 02/01/2024 2:44 PM Indications: pain Details: 22 G 3.5 in needle, ultrasound-guided posterior approach Medications: 2 mL lidocaine 1 %; 2 mL bupivacaine 0.25 %; 40 mg methylPREDNISolone acetate 40  MG/ML Outcome: tolerated well, no immediate complications  US-guided glenohumeral joint injection, left shoulder After discussion on risks/benefits/indications, informed verbal consent was obtained. A timeout was then performed. The patient was positioned lying lateral recumbent on examination table. The patient's shoulder was prepped with betadine and multiple alcohol swabs and utilizing ultrasound guidance, the patient's glenohumeral joint was identified on ultrasound. Using ultrasound guidance a 22-gauge, 3.5 inch needle with a mixture of 2:2:1 cc's lidocaine:bupivicaine:depomedrol was directed from a lateral to medial direction via in-plane technique into the glenohumeral joint with visualization of appropriate spread of injectate into the joint. Patient tolerated the procedure well without immediate complications.      Procedure, treatment alternatives, risks and benefits explained, specific risks discussed. Consent was given by the patient. Immediately prior to procedure a time out was called to verify the correct patient, procedure, equipment, support staff and site/side marked as required. Patient was prepped and draped in the usual sterile fashion.     - post-injection protocol discussed - discussed infrequent nature of injections if needed, > 3 months - follow-up with Dr. Roda Shutters as indicated; I am happy to see him as needed  Madelyn Brunner, DO Primary Care Sports Medicine Physician  [facility]  This note was dictated using Dragon naturally speaking software and may contain errors in syntax, spelling, or content which have not been identified prior to signing this note.

## 2024-05-02 NOTE — Progress Notes (Unsigned)
 NEUROLOGY FOLLOW UP OFFICE NOTE  Nathan Ballard [id number]  Assessment/Plan:   Mild neurocognitive disorder, stable; however, given family history, concern for possible early-onset Alzheimer's    Due to family history and possibility that this may indicate pre-cursor to Alzheimer's, he is taking donepezil  10mg  at bedtime  After discussion that he may be a candidate for Leqembi, we will pursue amyloid PET scan. Otherwise, follow up one year.  Total time spent in chart and face to face with patient:  40 minutes   Subjective:  Nathan Ballard is a 63 year old right-handed man with paroxysmal a fib, hypertension and hyperlipidemia who follows up for memory deficits  He is accompanied by his wife who provides some history as well.   UPDATE: Taking donepezil  10mg  at bedtime. Last seen a year ago.  Plan was to undergo LP for CSF analysis of Alzheimer's biomarkers but he changed his mind.  He notes some short term memory problems and has to write things down to get tasks done at work.  His wife told him that she sees some mild worsening memory.       HISTORY: His family has noticed memory problems since June 2021, after he lost his job related to the COVID-19 pandemic.  Sometimes when he is asked a question, he has a blank stare.  He has had trouble getting to places when driving on familiar routes but no trouble finding his way home.  No difficulty managing finances.  BCIT score was 2 (had trouble with name and address).  He has since been trying to find another job which has been frustrating.  He reports that his mother was diagnosed with Alzheimer's dementia in her late 24s-early 74s.  He underwent workup for memory loss.  Labs from October 2021 showed B12 698 and TSH 1.64.  MRI of brain with and without contrast on 09/17/2020 showed stable right frontal developmental venous anomaly but otherwise unremarkable.  Neuropsychological evaluation on 09/27/2020 demonstrated mild neurocognitive  disorder with impairment across encoding, retrieval, and consolidation aspects of verbal memory.  Etiology unclear.  Despite weakness in verbal memory, he did not display a pattern of performance consistent with early-onset Alzheimer's disease but could not rule out.   In 2021, he reports a new severe paroxysmal sharp pain in right temple lasting 5 to 8 seconds.  It has only occurred twice.   I saw him back in 2017 for spells with hypoxia and hypercapnia  He has had recurrent episodes in which he is unable to be woken up from a deep sleep with labored breathing.  It would last 10 to 15 minutes.  After he wakes up, he is either lucid or somewhat confused.  On a couple of occasions, he was given Narcan  by EMS and he awoke.  However, UDS was negative so it was thought to be coincidental.  He had several ED visits.  On 02/28/16, he was admitted to [facility] following another spell.  He was started on Keppra  500mg  twice daily.  CT of head was unremarkable.  MRI of brain personally reviewed only revealed a right frontal developmental venous anomaly (stable compared to MRI in 2008), but no acute process.  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  He became unresponsive in the hospital and had an O2 sat of 19% and was making gurgling noises.  He was intubated for  airway protection.  He was hypotensive but did not lose pulse. Echocardiogram was unremarkable and showed EF 55-60% with grade 1 diastolic dysfunction.  Routine EEG and overnight EEG were normal.  He was discharged on Keppra  1500mg  twice daily.  Acute hypoxic and hypercapnic respiratory failure was felt to be secondary to seizure.  He returned to the ED on 03/22/16 after another spell.  He was started on Depakote .  Later that evening, he was standing in the kitchen and developed a glassy look in his eyes.  He was slow to  respond.  He was sitting and awake but started to shake all over, followed by loss of consciousness.  Otherwise, he does not demonstrate any convulsions.  His wife drove him to nearby fire station for vitals, where his O2 sat was 68% on room air and 86% on O2.  He returned to the ED. Blood pressure was in the 90s/60s and pulse in the 40s.  He was given a loading dose of IV Depacon .   He continued to have spells in which he seems a little confused.  He does not demonstrate tongue biting or urinary/bowel incontinence.  Blood sugar has always been okay.  He was readmitted to the hospital on 03/29/16.  Long-term EEG was unremarkable.  Keppra  was discontinued and he was started on gabapentin  300mg  three times daily.  He remained on Depakote .  72 hour EEG from 04/09/16 to 04/11/16 was normal but did not capture his habitual spells.  He had another spell on 04/14/16.  He was seen by pulmonology and cardiology, both did not suspect primary pulmonary or cardiac etiology.  Since then, he has been doing well.  He had taken himself off Depakote  and gabapentin , as well as Cymbalta .  He has not had any other spells.  He has not had any apneic episodes during sleep.  He feels better.  His memory is better.  He was referred to the EMU at [facility] for further evaluation, as well as a sleep study, but he cancelled both since he had been feeling well.  Unclear if spells were central apneic.  He had a similar episode of confusion in 2008.  MRI of brain with and without contrast on 11/10/07 demonstrated the incidental developmental venous anomaly.  Episodes subsequently resolved and hasn't had another spell since 2017.  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   seizure free since 2017   Diverticulosis of colon    Generalized anxiety disorder    History of adenomatous polyp of colon 09/03/2012   tubual adenoma and hyperplastic   History of basal cell carcinoma excision 08/21/2014   scalp; also left lateral neck (02/15/2015)   History of COVID-19 06/2020   Hyperlipidemia    Hypomagnesemia 03/02/2016   Leukocytosis    Major depressive disorder 02/29/2016   Mild neurocognitive disorder, unclear etiology 09/27/2020   OA (osteoarthritis)    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     alfuzosin (UROXATRAL) 10 MG 24 hr tablet Take 10 mg by mouth daily.     Biotin 89999 MCG TABS Take 10,000 mcg by mouth daily.     celecoxib  (CELEBREX ) 200 MG capsule Take 1 capsule (200 mg total) by mouth 2 (two) times daily. 30 capsule 3   Cholecalciferol (VITAMIN D ) 50 MCG (2000 UT) tablet Take 2,000 Units by mouth daily.     donepezil  (ARICEPT ) 10 MG tablet TAKE 1 TABLET BY MOUTH AT BEDTIME (Patient taking differently: Take 10 mg by mouth at bedtime.) 30 tablet 5   finasteride (PROSCAR) 5 MG tablet Take 5 mg by mouth daily.     gabapentin  (NEURONTIN ) 300 MG capsule Take 300 mg by mouth at bedtime.     meloxicam (MOBIC) 7.5 MG tablet Take 7.5 mg by mouth as needed for pain.     Multiple Vitamin (MULTIVITAMIN) tablet Take 3 tablets by mouth daily. Living Green     OVER THE COUNTER MEDICATION Take 1 capsule by mouth daily. Neuriva     oxybutynin (DITROPAN XL) 15 MG 24 hr tablet Take 15 mg by mouth daily.     tamsulosin (FLOMAX) 0.4 MG CAPS capsule Take 0.8 mg by mouth at bedtime.      TURMERIC CURCUMIN PO Take 3 capsules by mouth daily.     No current facility-administered medications on file prior to visit.     ALLERGIES: Allergies  Allergen Reactions   Morphine And Codeine Nausea And Vomiting   Duloxetine  Hcl Other (See Comments)    Other reaction(s): Unknown    FAMILY HISTORY: Family History  Problem Relation Age of Onset   Alzheimer's disease Mother        early-onset presentation; late 40s/early 98s   Diabetes Father    Heart disease Father    Hypertension Father    Alzheimer's disease Maternal Aunt        symptom onset during late 70s/early 80s   Alzheimer's disease Maternal Grandmother        symptom onset during late 70s/early 80s   Colon cancer Neg Hx    Stomach cancer Neg Hx    Colon polyps Neg Hx    Esophageal cancer Neg Hx    Rectal cancer Neg Hx       Objective:  Blood pressure 139/84, pulse 72, height 6' (1.829 m), weight 202 lb (91.6 kg), SpO2 98%. General: No acute distress.  Patient appears well-groomed.   Head:  Normocephalic/atraumatic Neck:  Supple.  No paraspinal tenderness.  Full range of motion. Heart:  Regular rate and rhythm. Neuro:  Alert and oriented.  Speech fluent and not dysarthric.  Language intact.      08/28/2020   11:00 AM 04/25/2021    9:00 AM 04/30/2022    2:00 PM 05/04/2023    3:00 PM 05/03/2024    4:00 PM  [employer] Mental Exam  Weekday Correct 1 1 1 1 1   Current year 1 1 1 1 1   What state are we in? 1 1 1 1 1   Amount spent 1 0 1 1 1   Amount left 2 2 2 2 2   # of Animals 3 2 2 3 3  5  objects recall 0 1 1 3  0  Number series 1 1 2 2 2   Hour markers 2 0 2 2 2   Time correct 2 2 2  0 2  Placed  X in triangle correctly 1 1 1 1 1   Largest Figure 1 1 1 1 1   Name of male 2 2 2 2 2   Date back to work 0 2 0 0 0  Type of work 2 2 2 2 2   State she lived in 2 2 2  0 2  Total score 22 21 23 22 23    CN II-XII intact.  Bulk and tone normal.  Muscle strength 5/5 throughout.  Sensation to light touch intact.   Deep tendon reflexes 2+ throughout, toes downgoing.  Gait normal.  Romberg negative.    Nathan Dunnings, DO  CC:  Lonell Collet, DO

## 2024-05-03 ENCOUNTER — Ambulatory Visit: Payer: Managed Care, Other (non HMO) | Admitting: Neurology

## 2024-05-03 ENCOUNTER — Encounter: Payer: Self-pay | Admitting: Neurology

## 2024-05-03 VITALS — BP 139/84 | HR 72 | Ht 72.0 in | Wt 202.0 lb

## 2024-05-03 DIAGNOSIS — G3184 Mild cognitive impairment, so stated: Secondary | ICD-10-CM | POA: Diagnosis not present

## 2024-05-03 NOTE — Patient Instructions (Signed)
 Will try to get an amyloid PET scan.  Otherwise, reconsider the spinal tap Otherwise follow up one year

## 2024-05-10 ENCOUNTER — Encounter: Payer: Self-pay | Admitting: Cardiology

## 2024-05-10 ENCOUNTER — Ambulatory Visit: Attending: Cardiology | Admitting: Cardiology

## 2024-05-10 VITALS — BP 124/71 | HR 58 | Resp 16 | Ht 72.0 in | Wt 204.4 lb

## 2024-05-10 DIAGNOSIS — E7800 Pure hypercholesterolemia, unspecified: Secondary | ICD-10-CM | POA: Diagnosis not present

## 2024-05-10 DIAGNOSIS — R739 Hyperglycemia, unspecified: Secondary | ICD-10-CM | POA: Diagnosis not present

## 2024-05-10 DIAGNOSIS — I480 Paroxysmal atrial fibrillation: Secondary | ICD-10-CM

## 2024-05-10 MED ORDER — DILTIAZEM HCL 60 MG PO TABS
60.0000 mg | ORAL_TABLET | Freq: Once | ORAL | 0 refills | Status: AC | PRN
Start: 2024-05-10 — End: ?

## 2024-05-10 MED ORDER — FLECAINIDE ACETATE 150 MG PO TABS
150.0000 mg | ORAL_TABLET | Freq: Once | ORAL | 0 refills | Status: AC | PRN
Start: 2024-05-10 — End: ?

## 2024-05-10 NOTE — Progress Notes (Signed)
Cardiology Office Note:  .   Date:  05/10/2024  ID:  Nathan Ballard, DOB 1961/02/18, MRN [id number] PCP: Gerome Brunet, DO  [facility] Providers Cardiologist:  Gordy Bergamo, MD   History of Present Illness: .   Nathan Ballard is a 63 y.o. Male patient with hyperlipidemia, hyperglycemia, family history of premature coronary disease with father having had MI in his mid 24s, had new onset atrial fibrillation in December 2022 and had cardioversion at that time. He has been negative for OSA.    His last documented episode of atrial fibrillation was on 07/11/2022.  He is presently on pill in the pocket approach with flecainide .  I had last seen him almost 2 years ago.  Due to his chads vascular score of 0-1, he is not on any anticoagulation. He presented with AF to the ED on 04/19/2024 and took Dilt and Flecainide  prior to arrival and converted to sinus while waiting.   Discussed the use of AI scribe software for clinical note transcription with the patient, who gave verbal consent to proceed.  History of Present Illness Nathan Ballard is a 63 year old male with paroxysmal atrial fibrillation who presents for follow-up.  In June 2024, he experienced an episode of atrial fibrillation while playing pickleball, with palpitations and a heart rate of 200 bpm. He managed the episode with diltiazem  and flecainide , and his heart rhythm normalized within a few hours. An EKG at the emergency department showed normal rhythm. Since then, he has had occasional episodes of atrial fibrillation, managed with the same medications, with the last significant episode occurring a year ago.  Recent lab work from April 2025 shows an A1c of 6.4, indicating hyperglycemia. Cholesterol levels are normal, with an LDL of 78. He takes atorvastatin  40 mg daily, splitting the tablet in half.  A sleep study was negative for sleep apnea. He does not have hypertension.  Labs   [facility] external labs from PCP  03/01/2024:  Total cholesterol 146, triglyceride 100, HDL 49, LDL 78.  A1c 6.4%.  Hb 14.5, platelets 400 63K.  Serum creatinine 0.870, potassium 4.9, EGFR 80 mL.  ROS  Review of Systems  Cardiovascular:  Negative for chest pain, dyspnea on exertion and leg swelling.    Physical Exam:   VS:  BP 124/71 (BP Location: Left Arm, Patient Position: Sitting, Cuff Size: Normal)   Pulse (!) 58   Resp 16   Ht 6' (1.829 m)   Wt 204 lb 6.4 oz (92.7 kg)   SpO2 98%   BMI 27.72 kg/m    Wt Readings from Last 3 Encounters:  05/10/24 204 lb 6.4 oz (92.7 kg)  05/03/24 202 lb (91.6 kg)  05/04/23 204 lb 6.4 oz (92.7 kg)    Physical Exam Neck:     Vascular: No carotid bruit or JVD.   Cardiovascular:     Rate and Rhythm: Normal rate and regular rhythm.     Pulses: Intact distal pulses.     Heart sounds: Normal heart sounds. No murmur heard.    No gallop.  Pulmonary:     Effort: Pulmonary effort is normal.     Breath sounds: Normal breath sounds.  Abdominal:     General: Bowel sounds are normal.     Palpations: Abdomen is soft.   Musculoskeletal:     Right lower leg: No edema.     Left lower leg: No edema.    Studies Reviewed: SABRA    Exercise nuclear stress  test 12/04/2021: Normal ECG stress. The patient exercised for 6 minutes and 36 seconds of a Bruce protocol, achieving approximately 7.95 METs.  The heart rate response was normal. The blood pressure response was physiologic. No symptoms reported. Myocardial perfusion is normal. Overall LV systolic function is normal without regional wall motion abnormalities. Stress LV EF: 60%. No previous exam available for comparison. Low risk.  Echocardiogram 12/04/2021: Normal LV systolic function with visual EF 60-65%. Left ventricle cavity is normal in size. Normal left ventricular wall thickness. Normal global wall motion. Normal diastolic filling pattern, normal LAP. Mild tricuspid regurgitation. No evidence of pulmonary  hypertension. Compared to study 09/18/2015 no significant change.  EKG:    EKG Interpretation Date/Time:  Tuesday May 10 2024 16:30:51 EDT Ventricular Rate:  58 PR Interval:  160 QRS Duration:  90 QT Interval:  378 QTC Calculation: 371 R Axis:   17  Text Interpretation: EKG 05/10/2024: Sinus bradycardia at rate of 58 bpm, normal EKG.  Compared to 04/21/2023, no change. Confirmed by Andree Golphin, Jagadeesh ([id number]) on 05/10/2024 4:44:04 PM    Medications ordered    Meds ordered this encounter  Medications   flecainide  (TAMBOCOR ) 150 MG tablet    Sig: Take 1 tablet (150 mg total) by mouth once as needed for up to 1 dose (Take no more than 2 tab per day for onset of A. Fibrillation).    Dispense:  30 tablet    Refill:  0   diltiazem  (CARDIZEM ) 60 MG tablet    Sig: Take 1 tablet (60 mg total) by mouth once as needed for up to 1 dose (Onset of A. Fibrillation).    Dispense:  15 tablet    Refill:  0     ASSESSMENT AND PLAN: .      ICD-10-CM   1. Paroxysmal atrial fibrillation (HCC)  I48.0 EKG 12-Lead    flecainide  (TAMBOCOR ) 150 MG tablet    diltiazem  (CARDIZEM ) 60 MG tablet    2. Hyperglycemia  R73.9     3. Pure hypercholesterolemia  E78.00      Assessment & Plan Paroxysmal Atrial Fibrillation Paroxysmal atrial fibrillation with an episode in June 2024, managed with 'pill in the pocket' approach using flecainide  and diltiazem . No episodes since. EKG and labs normal. Blood pressure well controlled. Negative sleep study for sleep apnea. Discussed weight management to reduce AFib frequency. - Use diltiazem  60 mg and flecainide  300 mg at AFib onset as needed pill in the pocket. - If AFib persists after medication, wait 2-3 hours before seeking medical attention. - Contact office if AFib episodes become more frequent or if there are concerns. - Consider ablation if AFib frequency increases significantly. - Monitor weight and aim to lose 10-12 pounds to reduce AFib and diabetes  risk.  Hyperglycemia Hyperglycemia with A1c of 6.4% as of April 2025, indicating prediabetes. Discussed weight loss and dietary modifications to prevent diabetes progression. - Monitor carbohydrate intake and reduce consumption of desserts and starches. - Aim to lose 10-12 pounds to prevent progression to diabetes.  Hyperlipidemia Hyperlipidemia well managed with atorvastatin . LDL at 78 mg/dL, indicating good control. - Continue atorvastatin  40 mg, half tablet daily. - Office visit on a as needed basis.  I will request to refill flecainide  that he rarely uses to be done by his PCP, however if not feasible, I am happy to see him back.  Signed,  Gordy Bergamo, MD, [facility] 05/10/2024, 7:04 PM [facility] [address] [place], [place] [address] Phone: [phone]. Fax:  [phone]  

## 2024-05-10 NOTE — Patient Instructions (Signed)

## 2024-05-14 ENCOUNTER — Other Ambulatory Visit: Payer: Self-pay | Admitting: Orthopaedic Surgery

## 2024-05-31 NOTE — Progress Notes (Signed)
 Called patients insurance [employer] [phone].  Advised that CPT [id number] and CPT [phone].   Case PI#:[id number] PA denied. Additional Review Requested.  Clinical notes have been faxed to [phone]. Pending determination.

## 2024-06-20 ENCOUNTER — Other Ambulatory Visit: Payer: Self-pay | Admitting: Physician Assistant

## 2024-07-19 ENCOUNTER — Other Ambulatory Visit: Payer: Self-pay | Admitting: Physician Assistant

## 2024-07-21 ENCOUNTER — Telehealth: Payer: Self-pay

## 2024-07-21 DIAGNOSIS — G3184 Mild cognitive impairment, so stated: Secondary | ICD-10-CM

## 2024-07-21 NOTE — Telephone Encounter (Signed)
 Pet scan previously denied from insurance. Can we consider another dx code to help support approval?

## 2024-07-22 NOTE — Telephone Encounter (Signed)
 Per Dr. Skeet. Due to denial of pet scan we will have patient complete Neuropsychological Testing.

## 2024-07-27 NOTE — Telephone Encounter (Signed)
 Called and spoke to patient and informed him that his PET scan has been denied by insurance. Informed patient that because of this Dr. Skeet would like to have neuropsych testing done. Patient is ok with this and is aware someone from our front staff will give him a call to get him scheduled. Patient verbalized understanding and had no further questions or concerns

## 2024-07-27 NOTE — Addendum Note (Signed)
 Addended by: DASIE RHODY A on: 07/27/2024 08:41 AM   Modules accepted: Orders

## 2024-08-20 ENCOUNTER — Other Ambulatory Visit: Payer: Self-pay | Admitting: Physician Assistant

## 2024-09-12 ENCOUNTER — Encounter: Payer: Self-pay | Admitting: Radiology

## 2024-09-17 ENCOUNTER — Other Ambulatory Visit: Payer: Self-pay | Admitting: Physician Assistant

## 2024-10-22 ENCOUNTER — Other Ambulatory Visit: Payer: Self-pay | Admitting: Physician Assistant

## 2024-11-01 ENCOUNTER — Ambulatory Visit: Admitting: Internal Medicine

## 2024-11-01 ENCOUNTER — Other Ambulatory Visit (INDEPENDENT_AMBULATORY_CARE_PROVIDER_SITE_OTHER)

## 2024-11-01 ENCOUNTER — Encounter: Payer: Self-pay | Admitting: Internal Medicine

## 2024-11-01 VITALS — BP 128/72 | HR 66 | Ht 72.0 in | Wt 197.6 lb

## 2024-11-01 DIAGNOSIS — R195 Other fecal abnormalities: Secondary | ICD-10-CM

## 2024-11-01 DIAGNOSIS — R634 Abnormal weight loss: Secondary | ICD-10-CM | POA: Diagnosis not present

## 2024-11-01 DIAGNOSIS — K5730 Diverticulosis of large intestine without perforation or abscess without bleeding: Secondary | ICD-10-CM | POA: Diagnosis not present

## 2024-11-01 DIAGNOSIS — Z860100 Personal history of colon polyps, unspecified: Secondary | ICD-10-CM

## 2024-11-01 DIAGNOSIS — R1012 Left upper quadrant pain: Secondary | ICD-10-CM | POA: Diagnosis not present

## 2024-11-01 DIAGNOSIS — R197 Diarrhea, unspecified: Secondary | ICD-10-CM | POA: Diagnosis not present

## 2024-11-01 LAB — HIGH SENSITIVITY CRP: CRP, High Sensitivity: 3.5 mg/L (ref 0.200–5.000)

## 2024-11-01 NOTE — Patient Instructions (Signed)
 Your provider has requested that you go to the basement level for lab work before leaving today. Press B on the elevator. The lab is located at the first door on the left as you exit the elevator.  You have been scheduled for an endoscopy. Please follow written instructions given to you at your visit today.  If you use inhalers (even only as needed), please bring them with you on the day of your procedure.  If you take any of the following medications, they will need to be adjusted prior to your procedure:   DO NOT TAKE 7 DAYS PRIOR TO TEST- Trulicity (dulaglutide) Ozempic, Wegovy (semaglutide) Mounjaro, Zepbound (tirzepatide) Bydureon Bcise (exanatide extended release)  DO NOT TAKE 1 DAY PRIOR TO YOUR TEST Rybelsus (semaglutide) Adlyxin (lixisenatide) Victoza (liraglutide) Byetta (exanatide) _____________________________________________________________  _______________________________________________________  If your blood pressure at your visit was 140/90 or greater, please contact your primary care physician to follow up on this.  _______________________________________________________  If you are age 63 or older, your body mass index should be between 23-30. Your Body mass index is 26.8 kg/m. If this is out of the aforementioned range listed, please consider follow up with your Primary Care Provider.  If you are age 21 or younger, your body mass index should be between 19-25. Your Body mass index is 26.8 kg/m. If this is out of the aformentioned range listed, please consider follow up with your Primary Care Provider.   ________________________________________________________  The [facility] providers would like to encourage you to use MYCHART to communicate with providers for non-urgent requests or questions.  Due to long hold times on the telephone, sending your provider a message by [facility] may be a faster and more efficient way to get a response.  Please allow 48 business  hours for a response.  Please remember that this is for non-urgent requests.  _______________________________________________________  [facility] is using a team-based approach to care.  Your team is made up of your doctor and two to three APPS. Our APPS (Nurse Practitioners and Physician Assistants) work with your physician to ensure care continuity for you. They are fully qualified to address your health concerns and develop a treatment plan. They communicate directly with your gastroenterologist to care for you. Seeing the Advanced Practice Practitioners on your physician's team can help you by facilitating care more promptly, often allowing for earlier appointments, access to diagnostic testing, procedures, and other specialty referrals.

## 2024-11-01 NOTE — Progress Notes (Signed)
 Patient ID: Nathan Ballard, male   DOB: 11-Jun-1961, 63 y.o.   MRN: [id number] HPI:   Nathan Ballard is a 63 year old male with a history of nonadvanced tubular adenoma, colonic diverticulosis, and internal hemorrhoids who presents for evaluation of gastrointestinal symptoms including left upper abdominal pain, intermittent diarrhea, and unintentional weight loss.  Reports several months of intermittent sharp pain localized to the left upper abdomen, under the rib cage, lasting about an hour and occurring mostly during the day. Pain is sometimes food-triggered but lacks a consistent association with specific foods. Reports pain localized to the left upper abdomen. Pain does not wake him at night and is not associated with fever.  Describes episodes of diarrhea at least once a week, typically toward the end of the day. Some days begin with a normal stool followed by diarrhea later; other days, stools are softer but not frankly loose. Certain foods, such as salad with vinaigrette dressing and chicken skewers, are associated with diarrhea, though generally avoided. Notes episodes of darker brown stools, but not black or tarry. Recalls seeing blood in the stool once, not recently, possibly related to a hard stool or hemorrhoid. Denies increased gas or bloating beyond baseline.  Reports a change in appetite, with periods of increased hunger and other times preferring safe foods like peanut butter and jelly. Has unintentionally lost approximately 12-15 pounds since September, attributed in part to food avoidance due to concern about triggering symptoms.  Occasionally experiences nausea, sometimes feeling close to vomiting but has not vomited. Intermittent heartburn and indigestion have been present for a long time but may be more frequent recently.  Gastrointestinal history includes colonoscopies in 2013 and 2018 for single tubular adenomas, and most recently on 10/30/22 for surveillance, which  was normal without polyps but revealed diverticulosis from the cecum to the sigmoid and small internal hemorrhoids. Following the last colonoscopy, a ten-year surveillance interval was recommended. Laboratory studies from 02/24/24 showed hemoglobin 14, MCV 99, platelet count 225, white count 3.7, albumin 4.7, AST 30, ALT 25, ALK 561, and total bilirubin 0.8.  Current medications include Lipitor, Celebrex  (not taken in the past few days), flecainide  as needed, Mobic as needed, and donepezil  at night for memory issues. No new medications have been started this year.        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   Cancer (HCC)    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   seizure free since 2017   Diverticulosis of colon    Generalized anxiety disorder    History of adenomatous polyp of colon 09/03/2012   tubual adenoma and hyperplastic   History of basal cell carcinoma excision 08/21/2014   scalp; also left lateral neck (02/15/2015)   History of COVID-19 06/2020   Hyperlipidemia    Hypomagnesemia 03/02/2016   Leukocytosis    Major depressive disorder 02/29/2016   Mild neurocognitive disorder, unclear etiology 09/27/2020   OA (osteoarthritis)    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   Subcutaneous cyst    lower abdomen and right forearm    Past Surgical History:  Procedure Laterality Date   ANTERIOR CRUCIATE LIGAMENT REPAIR Left 1989; 2007   COLONOSCOPY  COLONOSCOPY WITH PROPOFOL   09/03/2012   KNEE ARTHROSCOPY WITH MEDIAL MENISECTOMY Right 09/19/2015   Procedure: RIGHT KNEE ARTHROSCOPY WITH PARTIAL MEDIAL MENISCECTOMY;  Surgeon: Kay CHRISTELLA Cummins, MD;  Location: [facility];  Service: Orthopedics;  Laterality: Right;   LIPOMA EXCISION Right 05/01/2017   Procedure: EXCISION OF CYST FROM RIGHT FOREARM AND LOWER ABDOMEN;  Surgeon: Signe Mitzie LABOR, MD;  Location: [facility];  Service: General;  Laterality: Right;  site= right arm and abdomen   POLYPECTOMY     REMOVAL GREAT TOE NAIL Right 1993   SHOULDER SURGERY Right 1999   TRANSTHORACIC ECHOCARDIOGRAM  03/01/2016   GRADE 1 DIASTOLIC DYSFUNCTION/  EF 55-60%   WRIST GANGLION EXCISION Right 1998    Outpatient Medications Prior to Visit  Medication Sig Dispense Refill   acetaminophen  (TYLENOL ) 500 MG tablet Take 500 mg by mouth every 6 (six) hours as needed for headache or mild pain.     alfuzosin (UROXATRAL) 10 MG 24 hr tablet Take 10 mg by mouth daily.     atorvastatin  (LIPITOR) 40 MG tablet Take 20 mg by mouth daily.     celecoxib  (CELEBREX ) 200 MG capsule Take 1 capsule by mouth twice daily 30 capsule 0   Cholecalciferol (VITAMIN D ) 50 MCG (2000 UT) tablet Take 2,000 Units by mouth daily.     diltiazem  (CARDIZEM ) 60 MG tablet Take 1 tablet (60 mg total) by mouth once as needed for up to 1 dose (Onset of A. Fibrillation). 15 tablet 0   donepezil  (ARICEPT ) 10 MG tablet TAKE 1 TABLET BY MOUTH AT BEDTIME 30 tablet 5   finasteride (PROSCAR) 5 MG tablet Take 5 mg by mouth daily.     flecainide  (TAMBOCOR ) 150 MG tablet Take 1 tablet (150 mg total) by mouth once as needed for up to 1 dose (Take no more than 2 tab per day for onset of A. Fibrillation). 30 tablet 0   gabapentin  (NEURONTIN ) 300 MG capsule Take 300 mg by mouth at bedtime.     meloxicam (MOBIC) 7.5 MG tablet Take 7.5 mg by mouth as needed for pain.     Multiple Vitamin (MULTIVITAMIN) tablet Take 3 tablets by mouth daily. Living Green     OVER THE COUNTER MEDICATION Take 1 capsule by mouth daily. Neuriva     oxybutynin (DITROPAN XL) 15 MG 24 hr tablet Take 15 mg by mouth daily.     tamsulosin (FLOMAX) 0.4 MG CAPS capsule Take  0.8 mg by mouth at bedtime.     TURMERIC CURCUMIN PO Take 3 capsules by mouth daily.     Biotin 89999 MCG TABS Take 10,000 mcg by mouth daily. (Patient not taking: Reported on 11/01/2024)     No facility-administered medications prior to visit.    Allergies[1]  Family History  Problem Relation Age of Onset   Alzheimer's disease Mother        early-onset presentation; late 40s/early 61s   Diabetes Father    Heart disease Father    Hypertension Father    Alzheimer's disease Maternal Aunt        symptom onset during late 70s/early 28s   Alzheimer's disease Maternal Grandmother        symptom onset during late 70s/early 80s   Colon cancer Neg Hx    Stomach cancer Neg Hx    Colon polyps Neg Hx    Esophageal cancer Neg Hx    Rectal cancer Neg Hx     Social History[2]  ROS:  As per history of present illness, otherwise negative  BP 128/72   Pulse 66   Ht 6' (1.829 m)   Wt 197 lb 9.6 oz (89.6 kg)   BMI 26.80 kg/m  Gen: awake, alert, NAD HEENT: anicteric  CV: RRR, no mrg Pulm: CTA b/l Abd: soft, NT/ND, +BS throughout, small umbilical hernia Ext: no c/c/e Neuro: nonfocal   RELEVANT LABS AND IMAGING: CBC    Component Value Date/Time   WBC 4.6 04/20/2023 2246   RBC 4.34 04/20/2023 2246   HGB 14.1 04/20/2023 2246   HCT 41.0 04/20/2023 2246   PLT 195 04/20/2023 2246   MCV 94.5 04/20/2023 2246   MCH 32.5 04/20/2023 2246   MCHC 34.4 04/20/2023 2246   RDW 11.9 04/20/2023 2246   LYMPHSABS 1.6 11/06/2021 1621   MONOABS 0.5 11/06/2021 1621   EOSABS 0.1 11/06/2021 1621   BASOSABS 0.0 11/06/2021 1621    CMP     Component Value Date/Time   NA 139 04/20/2023 2246   K 3.8 04/20/2023 2246   CL 108 04/20/2023 2246   CO2 25 04/20/2023 2246   GLUCOSE 114 (H) 04/20/2023 2246   BUN 17 04/20/2023 2246   CREATININE 0.87 04/20/2023 2246   CALCIUM  8.8 (L) 04/20/2023 2246   PROT 6.3 (L) 11/06/2021 1621   ALBUMIN 3.9 11/06/2021 1621   AST 33 11/06/2021 1621   ALT 39  11/06/2021 1621   ALKPHOS 53 11/06/2021 1621   BILITOT 0.8 11/06/2021 1621   GFRNONAA >60 04/20/2023 2246   GFRAA >60 03/31/2016 0219    Results   Labs Hgb (02/24/2024): 14 MCV (02/24/2024): 99 Platelet count (02/24/2024): 225 WBC (02/24/2024): 3.7 Albumin (02/24/2024): 4.7 AST (02/24/2024): 30 ALT (02/24/2024): 25 Alk Phos (02/24/2024): 561 Total bilirubin (02/24/2024): 0.8  Diagnostic Colonoscopy (10/30/2022): Normal; diverticulosis from cecum to sigmoid; small internal hemorrhoids; no polyps      ASSESSMENT/PLAN: LUQ pain, weight loss, loose dark stools Chronic (over multiple months), intermittent symptoms with weight loss and appetite changes. Differential includes peptic ulcer disease, upper GI pathology, parasitic infection. Further diagnostics needed. - Ordered stool ova and parasite exam for parasitic infection. - Ordered fecal calprotectin for intestinal inflammation. - Ordered TTG for celiac disease screening. - Ordered CRP for systemic inflammation. - Planned upper endoscopy with propofol  sedation for peptic ulcer disease or upper GI pathology. - Requested recent lab results from outside provider. - Cross-sectional imaging to be considered if above negative  Colonic diverticulosis Chronic, asymptomatic diverticulosis noted on colonoscopy without diverticulitis or complications. - No acute intervention required.   History of colonic polyps Subcentimeter small adenomas, last colonoscopy as above 2 years ago was normal without polyps or inflammation - Recall was 10 years based on last exam  30 minutes total spent today including patient facing time, coordination of care, reviewing medical history/procedures/pertinent radiology studies, and documentation of the encounter.           Rr:Rnoopwd, [place], Do [address] [place],  [place] [address]      [1]  Allergies Allergen Reactions   Morphine And Codeine Nausea And Vomiting   Duloxetine   Hcl Other (See Comments)    Other reaction(s): Unknown  [2]  Social History Tobacco Use   Smoking status: Never   Smokeless tobacco: Never  Vaping Use   Vaping status: Never Used  Substance Use Topics   Alcohol use: Yes    Comment: rarely   Drug use: No

## 2024-11-03 LAB — TISSUE TRANSGLUTAMINASE, IGA: (tTG) Ab, IgA: <1 U/mL

## 2024-11-04 ENCOUNTER — Other Ambulatory Visit: Payer: Self-pay | Admitting: Physician Assistant

## 2024-11-07 ENCOUNTER — Ambulatory Visit: Payer: Self-pay | Admitting: Internal Medicine

## 2024-12-05 ENCOUNTER — Other Ambulatory Visit: Payer: Self-pay | Admitting: Physician Assistant

## 2024-12-05 NOTE — Telephone Encounter (Signed)
 I can refill one more NSAID, but we have refilled these for a LONG time and I am unable to see any labs to look at kidney function. If he needs additional refills of this, he will need to send us  labs ( I cannot see any with kidney function in epic), or his pcp will need to refill

## 2024-12-07 ENCOUNTER — Encounter: Payer: Self-pay | Admitting: Internal Medicine

## 2024-12-15 ENCOUNTER — Encounter: Payer: Self-pay | Admitting: Internal Medicine

## 2024-12-15 ENCOUNTER — Ambulatory Visit: Admitting: Internal Medicine

## 2024-12-15 VITALS — BP 125/74 | HR 53 | Temp 97.5°F | Resp 15 | Ht 72.0 in | Wt 197.0 lb

## 2024-12-15 DIAGNOSIS — R634 Abnormal weight loss: Secondary | ICD-10-CM

## 2024-12-15 DIAGNOSIS — R1012 Left upper quadrant pain: Secondary | ICD-10-CM

## 2024-12-15 MED ORDER — SODIUM CHLORIDE 0.9 % IV SOLN: 500.0000 mL | INTRAVENOUS | Status: DC

## 2024-12-15 NOTE — Patient Instructions (Signed)
 YOU HAD AN ENDOSCOPIC PROCEDURE TODAY AT THE [facility]:   Refer to the procedure report that was given to you for any specific questions about what was found during the examination.  If the procedure report does not answer your questions, please call your gastroenterologist to clarify.  If you requested that your care partner not be given the details of your procedure findings, then the procedure report has been included in a sealed envelope for you to review at your convenience later.  YOU SHOULD EXPECT: Some feelings of bloating in the abdomen. Passage of more gas than usual.  Walking can help get rid of the air that was put into your GI tract during the procedure and reduce the bloating. If you had a lower endoscopy (such as a colonoscopy or flexible sigmoidoscopy) you may notice spotting of blood in your stool or on the toilet paper. If you underwent a bowel prep for your procedure, you may not have a normal bowel movement for a few days.  Please Note:  You might notice some irritation and congestion in your nose or some drainage.  This is from the oxygen used during your procedure.  There is no need for concern and it should clear up in a day or so.  SYMPTOMS TO REPORT IMMEDIATELY:  Following upper endoscopy (EGD)  Vomiting of blood or coffee ground material  New chest pain or pain under the shoulder blades  Painful or persistently difficult swallowing  New shortness of breath  Fever of 100F or higher  Black, tarry-looking stools  Resume previous diet Continue present medications Await pathology results  For urgent or emergent issues, a gastroenterologist can be reached at any hour by calling [phone]. Do not use MyChart messaging for urgent concerns.    DIET:  We do recommend a small meal at first, but then you may proceed to your regular diet.  Drink plenty of fluids but you should avoid alcoholic beverages for 24 hours.  ACTIVITY:  You should plan to take it  easy for the rest of today and you should NOT DRIVE or use heavy machinery until tomorrow (because of the sedation medicines used during the test).    FOLLOW UP: Our staff will call the number listed on your records the next business day following your procedure.  We will call around 7:15- 8:00 am to check on you and address any questions or concerns that you may have regarding the information given to you following your procedure. If we do not reach you, we will leave a message.     If any biopsies were taken you will be contacted by phone or by letter within the next 1-3 weeks.  Please call us  at [phone] if you have not heard about the biopsies in 3 weeks.    SIGNATURES/CONFIDENTIALITY: You and/or your care partner have signed paperwork which will be entered into your electronic medical record.  These signatures attest to the fact that that the information above on your After Visit Summary has been reviewed and is understood.  Full responsibility of the confidentiality of this discharge information lies with you and/or your care-partner.

## 2024-12-15 NOTE — Progress Notes (Signed)
 Called to room to assist during endoscopic procedure.  Patient ID and intended procedure confirmed with present staff. Received instructions for my participation in the procedure from the performing physician.

## 2024-12-15 NOTE — Progress Notes (Signed)
 Pt's states no medical or surgical changes since previsit or office visit.

## 2024-12-15 NOTE — Progress Notes (Signed)
 "   GASTROENTEROLOGY PROCEDURE H&P NOTE   Primary Care Physician: Gerome Brunet, DO    Reason for Procedure:  Left upper quadrant pain, weight loss and dark stools  Plan:    EGD  Patient is appropriate for endoscopic procedure(s) in the ambulatory ([facility]) setting.  The nature of the procedure, as well as the risks, benefits, and alternatives were carefully and thoroughly reviewed with the patient. Ample time for discussion and questions allowed.  All questions were answered. The patient understood, was satisfied, and agreed with the plan to proceed.    HPI: Nathan Ballard is a 64 y.o. male who presents for EFD.  Medical history as below. No recent chest pain or shortness of breath.  No abdominal pain today.  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   Cancer (HCC)    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   seizure free since 2017   Diverticulosis of colon    Generalized anxiety disorder    History of adenomatous polyp of colon 09/03/2012   tubual adenoma and hyperplastic   History of basal cell carcinoma excision 08/21/2014   scalp; also left lateral neck (02/15/2015)   History of COVID-19 06/2020   Hyperlipidemia    Hypomagnesemia 03/02/2016   Leukocytosis    Major depressive disorder 02/29/2016   Mild neurocognitive disorder, unclear etiology 09/27/2020   OA (osteoarthritis)    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   Subcutaneous cyst    lower abdomen and right forearm    Past Surgical History:  Procedure Laterality Date   ANTERIOR CRUCIATE LIGAMENT REPAIR Left 1989; 2007   COLONOSCOPY     COLONOSCOPY WITH PROPOFOL   09/03/2012   KNEE ARTHROSCOPY WITH MEDIAL  MENISECTOMY Right 09/19/2015   Procedure: RIGHT KNEE ARTHROSCOPY WITH PARTIAL MEDIAL MENISCECTOMY;  Surgeon: Kay CHRISTELLA Cummins, MD;  Location: [facility];  Service: Orthopedics;  Laterality: Right;   LIPOMA EXCISION Right 05/01/2017   Procedure: EXCISION OF CYST FROM RIGHT FOREARM AND LOWER ABDOMEN;  Surgeon: Signe Mitzie LABOR, MD;  Location: [facility];  Service: General;  Laterality: Right;  site= right arm and abdomen   POLYPECTOMY     REMOVAL GREAT TOE NAIL Right 1993   SHOULDER SURGERY Right 1999   TRANSTHORACIC ECHOCARDIOGRAM  03/01/2016   GRADE 1 DIASTOLIC DYSFUNCTION/  EF 55-60%   WRIST GANGLION EXCISION Right 1998    Prior to Admission medications  Medication Sig Start Date End Date Taking? Authorizing Provider  acetaminophen  (TYLENOL ) 500 MG tablet Take 500 mg by mouth every 6 (six) hours as needed for headache or mild pain.   Yes [provider]  alfuzosin (UROXATRAL) 10 MG 24 hr tablet Take 10 mg by mouth daily. 07/16/21  Yes [provider]  atorvastatin  (LIPITOR) 40 MG tablet Take 20 mg by mouth daily.   Yes [provider]  celecoxib  (CELEBREX ) 200 MG capsule Take 1 capsule by mouth twice daily 12/05/24  Yes Jule Ronal CROME, PA-C  Cholecalciferol (VITAMIN D ) 50 MCG (2000 UT) tablet Take 2,000 Units by mouth daily.   Yes [provider]  diltiazem  (CARDIZEM ) 60 MG tablet Take 1 tablet (60 mg total) by mouth once as needed for up  to 1 dose (Onset of A. Fibrillation). 05/10/24  Yes Ladona Heinz, MD  donepezil  (ARICEPT ) 10 MG tablet TAKE 1 TABLET BY MOUTH AT BEDTIME 05/31/21  Yes Jaffe, Adam R, DO  finasteride (PROSCAR) 5 MG tablet Take 5 mg by mouth daily.   Yes [provider]  flecainide  (TAMBOCOR ) 150 MG tablet Take 1 tablet (150 mg total) by mouth once as needed for up to 1 dose (Take no more than 2 tab per day for onset of A. Fibrillation). 05/10/24  Yes Ladona Heinz, MD  gabapentin  (NEURONTIN ) 300 MG capsule Take 300  mg by mouth at bedtime.   Yes [provider]  Multiple Vitamin (MULTIVITAMIN) tablet Take 3 tablets by mouth daily. Living Green   Yes [provider]  OVER THE COUNTER MEDICATION Take 1 capsule by mouth daily. Neuriva   Yes [provider]  oxybutynin (DITROPAN XL) 15 MG 24 hr tablet Take 15 mg by mouth daily. 11/05/21  Yes [provider]  tamsulosin (FLOMAX) 0.4 MG CAPS capsule Take 0.8 mg by mouth at bedtime. 11/05/21  Yes [provider]  TURMERIC CURCUMIN PO Take 3 capsules by mouth daily.   Yes [provider]  meloxicam (MOBIC) 7.5 MG tablet Take 7.5 mg by mouth as needed for pain.    [provider]    Current Outpatient Medications  Medication Sig Dispense Refill   acetaminophen  (TYLENOL ) 500 MG tablet Take 500 mg by mouth every 6 (six) hours as needed for headache or mild pain.     alfuzosin (UROXATRAL) 10 MG 24 hr tablet Take 10 mg by mouth daily.     atorvastatin  (LIPITOR) 40 MG tablet Take 20 mg by mouth daily.     celecoxib  (CELEBREX ) 200 MG capsule Take 1 capsule by mouth twice daily 30 capsule 0   Cholecalciferol (VITAMIN D ) 50 MCG (2000 UT) tablet Take 2,000 Units by mouth daily.     diltiazem  (CARDIZEM ) 60 MG tablet Take 1 tablet (60 mg total) by mouth once as needed for up to 1 dose (Onset of A. Fibrillation). 15 tablet 0   donepezil  (ARICEPT ) 10 MG tablet TAKE 1 TABLET BY MOUTH AT BEDTIME 30 tablet 5   finasteride (PROSCAR) 5 MG tablet Take 5 mg by mouth daily.     flecainide  (TAMBOCOR ) 150 MG tablet Take 1 tablet (150 mg total) by mouth once as needed for up to 1 dose (Take no more than 2 tab per day for onset of A. Fibrillation). 30 tablet 0   gabapentin  (NEURONTIN ) 300 MG capsule Take 300 mg by mouth at bedtime.     Multiple Vitamin (MULTIVITAMIN) tablet Take 3 tablets by mouth daily. Living Green     OVER THE COUNTER MEDICATION Take 1 capsule by mouth daily. Neuriva     oxybutynin (DITROPAN XL) 15 MG 24 hr  tablet Take 15 mg by mouth daily.     tamsulosin (FLOMAX) 0.4 MG CAPS capsule Take 0.8 mg by mouth at bedtime.     TURMERIC CURCUMIN PO Take 3 capsules by mouth daily.     meloxicam (MOBIC) 7.5 MG tablet Take 7.5 mg by mouth as needed for pain.     Current Facility-Administered Medications  Medication Dose Route Frequency Provider Last Rate Last Admin   0.9 %  sodium chloride  infusion  500 mL Intravenous Continuous Jameel Quant, Heinz HERO, MD        Allergies as of 12/15/2024 - Review Complete 12/15/2024  Allergen Reaction Noted   Morphine and  codeine Nausea And Vomiting 09/19/2015   Duloxetine  hcl Other (See Comments) 11/07/2021    Family History  Problem Relation Age of Onset   Alzheimer's disease Mother        early-onset presentation; late 40s/early 64s   Diabetes Father    Heart disease Father    Hypertension Father    Alzheimer's disease Maternal Aunt        symptom onset during late 70s/early 57s   Alzheimer's disease Maternal Grandmother        symptom onset during late 70s/early 80s   Colon cancer Neg Hx    Stomach cancer Neg Hx    Colon polyps Neg Hx    Esophageal cancer Neg Hx    Rectal cancer Neg Hx     Social History   Socioeconomic History   Marital status: Married    Spouse name: Not on file   Number of children: 2   Years of education: 14   Highest education level: Some college, no degree  Occupational History   Not on file  Tobacco Use   Smoking status: Never   Smokeless tobacco: Never  Vaping Use   Vaping status: Never Used  Substance and Sexual Activity   Alcohol use: Yes    Comment: rarely   Drug use: No   Sexual activity: Not on file  Other Topics Concern   Not on file  Social History Narrative   Right handed   Lives in one story home with wife   Social Drivers of Health   Tobacco Use: Low Risk (12/15/2024)   Patient History    Smoking Tobacco Use: Never    Smokeless Tobacco Use: Never    Passive Exposure: Not on file  Financial Resource  Strain: Not on file  Food Insecurity: Not on file  Transportation Needs: Not on file  Physical Activity: Not on file  Stress: Not on file  Social Connections: Not on file  Intimate Partner Violence: Not on file  Depression (PHQ2-9): Not on file  Alcohol Screen: Not on file  Housing: Not on file  Utilities: Not on file  Health Literacy: Not on file    Physical Exam: Vital signs in last 24 hours: @BP  (!) 146/90   Pulse 62   Temp (!) 97.5 F (36.4 C)   Ht 6' (1.829 m)   Wt 197 lb (89.4 kg)   SpO2 98%   BMI 26.72 kg/m  GEN: NAD EYE: Sclerae anicteric ENT: MMM CV: Non-tachycardic Pulm: CTA b/l GI: Soft, NT/ND NEURO:  Alert & Oriented x 3   Gordy Starch, MD [facility]  12/15/2024 8:43 AM  "

## 2024-12-15 NOTE — Progress Notes (Signed)
 Report to PACU, RN, vss, BBS= Clear.

## 2024-12-15 NOTE — Op Note (Signed)
 [facility] Patient Name: Nathan Ballard Procedure Date: 12/15/2024 8:34 AM MRN: [id number] Endoscopist: Gordy CHRISTELLA Starch , MD, [id number] Age: 64 Referring MD:  Date of Birth: 1961/09/21 Gender: Male Account #: [id number] Procedure:                Upper GI endoscopy Indications:              Abdominal pain in the left upper quadrant, Weight                            loss Medicines:                Monitored Anesthesia Care Procedure:                Pre-Anesthesia Assessment:                           - Prior to the procedure, a History and Physical                            was performed, and patient medications and                            allergies were reviewed. The patient's tolerance of                            previous anesthesia was also reviewed. The risks                            and benefits of the procedure and the sedation                            options and risks were discussed with the patient.                            All questions were answered, and informed consent                            was obtained. Prior Anticoagulants: The patient has                            taken no anticoagulant or antiplatelet agents. ASA                            Grade Assessment: II - A patient with mild systemic                            disease. After reviewing the risks and benefits,                            the patient was deemed in satisfactory condition to                            undergo the procedure.  After obtaining informed consent, the endoscope was                            passed under direct vision. Throughout the                            procedure, the patient's blood pressure, pulse, and                            oxygen saturations were monitored continuously. The                            Olympus Scope SN [id number] was introduced through the                            mouth, and advanced to the second part of  duodenum.                            The upper GI endoscopy was accomplished without                            difficulty. The patient tolerated the procedure                            well. Scope In: Scope Out: Findings:                 The examined esophagus was normal.                           The gastroesophageal flap valve was visualized                            endoscopically and classified as Hill Grade II                            (fold present, opens with respiration).                           Diffuse mildly erythematous mucosa without bleeding                            was found in the gastric antrum. Biopsies were                            taken with a cold forceps for Helicobacter pylori                            testing.                           The examined duodenum was normal. Complications:            No immediate complications. Estimated Blood Loss:     Estimated blood loss was minimal. Impression:               -  Normal esophagus.                           - Gastroesophageal flap valve classified as Hill                            Grade II (fold present, opens with respiration).                           - Erythematous mucosa in the antrum. Biopsied.                           - Normal examined duodenum. Recommendation:           - Patient has a contact number available for                            emergencies. The signs and symptoms of potential                            delayed complications were discussed with the                            patient. Return to normal activities tomorrow.                            Written discharge instructions were provided to the                            patient.                           - Resume previous diet.                           - Continue present medications.                           - Await pathology results.                           - If symptom continue and gastric biopsies are                             negative for H. Pylori then CT abd/pelvis with                            contrast is recommended. Gordy CHRISTELLA Starch, MD 12/15/2024 8:59:46 AM This report has been signed electronically.

## 2024-12-16 ENCOUNTER — Telehealth: Payer: Self-pay

## 2024-12-16 NOTE — Telephone Encounter (Signed)
" °  Follow up Call-     12/15/2024    8:02 AM 10/30/2022    8:22 AM  Call back number  Post procedure Call Back phone  # [phone]  Permission to leave phone message Yes Yes     Patient questions:  Do you have a fever, pain , or abdominal swelling? No. Pain Score  0 *  Have you tolerated food without any problems? Yes.    Have you been able to return to your normal activities? Yes.    Do you have any questions about your discharge instructions: Diet   No. Medications  No. Follow up visit  No.  Do you have questions or concerns about your Care? No.  Actions: * If pain score is 4 or above: No action needed, pain <4.   "

## 2024-12-22 ENCOUNTER — Institutional Professional Consult (permissible substitution): Admitting: Psychology

## 2024-12-22 ENCOUNTER — Ambulatory Visit: Payer: Self-pay

## 2024-12-29 ENCOUNTER — Encounter: Admitting: Psychology

## 2025-04-17 ENCOUNTER — Ambulatory Visit: Admitting: Neurology
